# Patient Record
Sex: Male | Born: 1950 | Race: Black or African American | Hispanic: No | Marital: Single | Smoking: Former smoker
Health system: Southern US, Community
[De-identification: ages and names within clinical notes are randomized; demographics above are authoritative.]

## PROBLEM LIST (undated history)

## (undated) DIAGNOSIS — I10 Essential (primary) hypertension: Secondary | ICD-10-CM

## (undated) DIAGNOSIS — IMO0001 Reserved for inherently not codable concepts without codable children: Secondary | ICD-10-CM

## (undated) DIAGNOSIS — J449 Chronic obstructive pulmonary disease, unspecified: Secondary | ICD-10-CM

## (undated) DIAGNOSIS — D649 Anemia, unspecified: Secondary | ICD-10-CM

## (undated) DIAGNOSIS — H9190 Unspecified hearing loss, unspecified ear: Secondary | ICD-10-CM

## (undated) DIAGNOSIS — J45909 Unspecified asthma, uncomplicated: Secondary | ICD-10-CM

## (undated) DIAGNOSIS — E559 Vitamin D deficiency, unspecified: Secondary | ICD-10-CM

## (undated) DIAGNOSIS — R140 Abdominal distension (gaseous): Secondary | ICD-10-CM

## (undated) DIAGNOSIS — Z720 Tobacco use: Secondary | ICD-10-CM

## (undated) DIAGNOSIS — E119 Type 2 diabetes mellitus without complications: Secondary | ICD-10-CM

## (undated) HISTORY — DX: Reserved for inherently not codable concepts without codable children: IMO0001

## (undated) HISTORY — DX: Tobacco use: Z72.0

## (undated) HISTORY — DX: Essential (primary) hypertension: I10

## (undated) HISTORY — PX: EYE SURGERY: SHX253

## (undated) HISTORY — DX: Vitamin D deficiency, unspecified: E55.9

## (undated) HISTORY — DX: Abdominal distension (gaseous): R14.0

## (undated) HISTORY — DX: Unspecified hearing loss, unspecified ear: H91.90

## (undated) HISTORY — DX: Chronic obstructive pulmonary disease, unspecified: J44.9

## (undated) HISTORY — DX: Unspecified asthma, uncomplicated: J45.909

## (undated) HISTORY — DX: Type 2 diabetes mellitus without complications: E11.9

---

## 1999-03-13 ENCOUNTER — Ambulatory Visit (HOSPITAL_COMMUNITY): Admission: RE | Admit: 1999-03-13 | Discharge: 1999-03-13 | Payer: Self-pay | Admitting: Gastroenterology

## 2000-07-29 ENCOUNTER — Encounter: Admission: RE | Admit: 2000-07-29 | Discharge: 2000-07-29 | Payer: Self-pay | Admitting: Internal Medicine

## 2000-07-29 ENCOUNTER — Encounter: Payer: Self-pay | Admitting: Internal Medicine

## 2003-03-30 ENCOUNTER — Ambulatory Visit (HOSPITAL_BASED_OUTPATIENT_CLINIC_OR_DEPARTMENT_OTHER): Admission: RE | Admit: 2003-03-30 | Discharge: 2003-03-30 | Payer: Self-pay | Admitting: Internal Medicine

## 2003-12-06 ENCOUNTER — Emergency Department (HOSPITAL_COMMUNITY): Admission: EM | Admit: 2003-12-06 | Discharge: 2003-12-06 | Payer: Self-pay | Admitting: Emergency Medicine

## 2006-12-06 ENCOUNTER — Encounter: Admission: RE | Admit: 2006-12-06 | Discharge: 2006-12-06 | Payer: Self-pay | Admitting: Internal Medicine

## 2011-08-24 ENCOUNTER — Other Ambulatory Visit: Payer: Self-pay | Admitting: Internal Medicine

## 2012-10-24 ENCOUNTER — Encounter (HOSPITAL_COMMUNITY): Payer: Self-pay

## 2012-10-24 DIAGNOSIS — I10 Essential (primary) hypertension: Secondary | ICD-10-CM | POA: Insufficient documentation

## 2012-10-24 DIAGNOSIS — J309 Allergic rhinitis, unspecified: Secondary | ICD-10-CM | POA: Insufficient documentation

## 2012-10-24 DIAGNOSIS — J45909 Unspecified asthma, uncomplicated: Secondary | ICD-10-CM | POA: Insufficient documentation

## 2012-10-24 DIAGNOSIS — E119 Type 2 diabetes mellitus without complications: Secondary | ICD-10-CM | POA: Insufficient documentation

## 2012-10-24 DIAGNOSIS — J449 Chronic obstructive pulmonary disease, unspecified: Secondary | ICD-10-CM | POA: Insufficient documentation

## 2012-10-24 DIAGNOSIS — Z720 Tobacco use: Secondary | ICD-10-CM | POA: Insufficient documentation

## 2017-11-04 ENCOUNTER — Ambulatory Visit
Admission: RE | Admit: 2017-11-04 | Discharge: 2017-11-04 | Disposition: A | Discharge status: Home / Self Care | Payer: Self-pay | Source: Ambulatory Visit | Attending: Internal Medicine | Admitting: Internal Medicine

## 2017-11-04 ENCOUNTER — Other Ambulatory Visit: Payer: Self-pay | Admitting: Internal Medicine

## 2017-11-04 DIAGNOSIS — I10 Essential (primary) hypertension: Secondary | ICD-10-CM

## 2017-11-04 DIAGNOSIS — Z720 Tobacco use: Secondary | ICD-10-CM

## 2018-01-23 ENCOUNTER — Other Ambulatory Visit (HOSPITAL_COMMUNITY): Payer: Self-pay | Admitting: Internal Medicine

## 2018-01-23 DIAGNOSIS — Z720 Tobacco use: Secondary | ICD-10-CM

## 2018-01-28 ENCOUNTER — Other Ambulatory Visit (HOSPITAL_BASED_OUTPATIENT_CLINIC_OR_DEPARTMENT_OTHER): Payer: Self-pay

## 2018-01-28 DIAGNOSIS — G4730 Sleep apnea, unspecified: Secondary | ICD-10-CM

## 2018-01-29 ENCOUNTER — Ambulatory Visit (HOSPITAL_COMMUNITY): Payer: BLUE CROSS/BLUE SHIELD

## 2018-03-03 ENCOUNTER — Encounter (HOSPITAL_BASED_OUTPATIENT_CLINIC_OR_DEPARTMENT_OTHER): Payer: BLUE CROSS/BLUE SHIELD

## 2018-04-02 ENCOUNTER — Emergency Department (HOSPITAL_COMMUNITY): Payer: BLUE CROSS/BLUE SHIELD

## 2018-04-02 ENCOUNTER — Inpatient Hospital Stay (HOSPITAL_COMMUNITY)
Admission: EM | Admit: 2018-04-02 | Discharge: 2018-04-04 | DRG: 809 | Disposition: A | Discharge status: Home / Self Care | Payer: BLUE CROSS/BLUE SHIELD | Attending: Internal Medicine | Admitting: Internal Medicine

## 2018-04-02 ENCOUNTER — Other Ambulatory Visit: Payer: Self-pay

## 2018-04-02 ENCOUNTER — Inpatient Hospital Stay (HOSPITAL_COMMUNITY): Payer: BLUE CROSS/BLUE SHIELD

## 2018-04-02 ENCOUNTER — Encounter (HOSPITAL_COMMUNITY): Payer: Self-pay

## 2018-04-02 DIAGNOSIS — E559 Vitamin D deficiency, unspecified: Secondary | ICD-10-CM | POA: Diagnosis present

## 2018-04-02 DIAGNOSIS — K2980 Duodenitis without bleeding: Secondary | ICD-10-CM | POA: Diagnosis present

## 2018-04-02 DIAGNOSIS — Z91013 Allergy to seafood: Secondary | ICD-10-CM

## 2018-04-02 DIAGNOSIS — D61818 Other pancytopenia: Secondary | ICD-10-CM | POA: Diagnosis not present

## 2018-04-02 DIAGNOSIS — I10 Essential (primary) hypertension: Secondary | ICD-10-CM | POA: Diagnosis present

## 2018-04-02 DIAGNOSIS — Z87891 Personal history of nicotine dependence: Secondary | ICD-10-CM

## 2018-04-02 DIAGNOSIS — E119 Type 2 diabetes mellitus without complications: Secondary | ICD-10-CM

## 2018-04-02 DIAGNOSIS — K5730 Diverticulosis of large intestine without perforation or abscess without bleeding: Secondary | ICD-10-CM | POA: Diagnosis present

## 2018-04-02 DIAGNOSIS — E875 Hyperkalemia: Secondary | ICD-10-CM | POA: Diagnosis present

## 2018-04-02 DIAGNOSIS — N179 Acute kidney failure, unspecified: Secondary | ICD-10-CM | POA: Diagnosis present

## 2018-04-02 DIAGNOSIS — Z7982 Long term (current) use of aspirin: Secondary | ICD-10-CM

## 2018-04-02 DIAGNOSIS — H9190 Unspecified hearing loss, unspecified ear: Secondary | ICD-10-CM | POA: Diagnosis present

## 2018-04-02 DIAGNOSIS — E0800 Diabetes mellitus due to underlying condition with hyperosmolarity without nonketotic hyperglycemic-hyperosmolar coma (NKHHC): Secondary | ICD-10-CM | POA: Diagnosis not present

## 2018-04-02 DIAGNOSIS — J449 Chronic obstructive pulmonary disease, unspecified: Secondary | ICD-10-CM | POA: Diagnosis present

## 2018-04-02 DIAGNOSIS — D7589 Other specified diseases of blood and blood-forming organs: Secondary | ICD-10-CM | POA: Diagnosis present

## 2018-04-02 DIAGNOSIS — D638 Anemia in other chronic diseases classified elsewhere: Secondary | ICD-10-CM | POA: Diagnosis present

## 2018-04-02 DIAGNOSIS — I440 Atrioventricular block, first degree: Secondary | ICD-10-CM | POA: Diagnosis present

## 2018-04-02 DIAGNOSIS — J301 Allergic rhinitis due to pollen: Secondary | ICD-10-CM

## 2018-04-02 DIAGNOSIS — R911 Solitary pulmonary nodule: Secondary | ICD-10-CM | POA: Diagnosis present

## 2018-04-02 DIAGNOSIS — K921 Melena: Secondary | ICD-10-CM | POA: Diagnosis present

## 2018-04-02 DIAGNOSIS — K2950 Unspecified chronic gastritis without bleeding: Secondary | ICD-10-CM | POA: Diagnosis present

## 2018-04-02 DIAGNOSIS — K648 Other hemorrhoids: Secondary | ICD-10-CM | POA: Diagnosis present

## 2018-04-02 DIAGNOSIS — J309 Allergic rhinitis, unspecified: Secondary | ICD-10-CM | POA: Diagnosis present

## 2018-04-02 DIAGNOSIS — D649 Anemia, unspecified: Secondary | ICD-10-CM

## 2018-04-02 DIAGNOSIS — I959 Hypotension, unspecified: Secondary | ICD-10-CM | POA: Diagnosis present

## 2018-04-02 HISTORY — DX: Anemia, unspecified: D64.9

## 2018-04-02 LAB — RETICULOCYTES
RBC.: 2.29 MIL/uL — ABNORMAL LOW (ref 4.22–5.81)
RBC.: <1 MIL/uL — ABNORMAL LOW (ref 4.22–5.81)
RETIC CT PCT: 2.3 % (ref 0.4–3.1)
Retic Count, Absolute: 57.3 10*3/uL (ref 19.0–186.0)
Retic Ct Pct: 2.5 % (ref 0.4–3.1)

## 2018-04-02 LAB — BASIC METABOLIC PANEL
ANION GAP: 6 (ref 5–15)
Anion gap: 9 (ref 5–15)
BUN: 39 mg/dL — ABNORMAL HIGH (ref 8–23)
BUN: 40 mg/dL — ABNORMAL HIGH (ref 8–23)
CHLORIDE: 106 mmol/L (ref 98–111)
CHLORIDE: 107 mmol/L (ref 98–111)
CO2: 21 mmol/L — ABNORMAL LOW (ref 22–32)
CO2: 23 mmol/L (ref 22–32)
CREATININE: 2.08 mg/dL — AB (ref 0.61–1.24)
Calcium: 9 mg/dL (ref 8.9–10.3)
Calcium: 8.9 mg/dL (ref 8.9–10.3)
Creatinine, Ser: 2.31 mg/dL — ABNORMAL HIGH (ref 0.61–1.24)
GFR calc non Af Amer: 32 mL/min — ABNORMAL LOW (ref >60)
GFR calc non Af Amer: 28 mL/min — ABNORMAL LOW (ref >60)
GFR, EST AFRICAN AMERICAN: 37 mL/min — AB (ref >60)
GFR, EST AFRICAN AMERICAN: 33 mL/min — AB (ref >60)
GLUCOSE: 118 mg/dL — AB (ref 70–99)
Glucose, Bld: 112 mg/dL — ABNORMAL HIGH (ref 70–99)
Potassium: 5.5 mmol/L — ABNORMAL HIGH (ref 3.5–5.1)
Potassium: 5.6 mmol/L — ABNORMAL HIGH (ref 3.5–5.1)
Sodium: 136 mmol/L (ref 135–145)
Sodium: 136 mmol/L (ref 135–145)

## 2018-04-02 LAB — HEMOGLOBIN A1C
Hgb A1c MFr Bld: 7.3 % — ABNORMAL HIGH (ref 4.8–5.6)
Mean Plasma Glucose: 162.81 mg/dL

## 2018-04-02 LAB — CBC WITH DIFFERENTIAL/PLATELET
BASOS ABS: 0 10*3/uL (ref 0.0–0.1)
Basophils Relative: 1 %
Eosinophils Absolute: 0 10*3/uL (ref 0.0–0.7)
Eosinophils Relative: 1 %
HEMATOCRIT: 11.4 % — AB (ref 39.0–52.0)
HEMOGLOBIN: 3.6 g/dL — AB (ref 13.0–17.0)
LYMPHS PCT: 36 %
Lymphs Abs: 0.9 10*3/uL (ref 0.7–4.0)
MCH: 41.4 pg — ABNORMAL HIGH (ref 26.0–34.0)
MCHC: 31.6 g/dL (ref 30.0–36.0)
MCV: 131 fL — ABNORMAL HIGH (ref 78.0–100.0)
MONOS PCT: 19 %
Monocytes Absolute: 0.5 10*3/uL (ref 0.1–1.0)
NEUTROS ABS: 1.1 10*3/uL — AB (ref 1.7–7.7)
Neutrophils Relative %: 43 %
Platelets: 102 10*3/uL — ABNORMAL LOW (ref 150–400)
RDW: 16.2 % — ABNORMAL HIGH (ref 11.5–15.5)
WBC: 2.5 10*3/uL — AB (ref 4.0–10.5)

## 2018-04-02 LAB — HEPATIC FUNCTION PANEL
ALBUMIN: 3.7 g/dL (ref 3.5–5.0)
ALK PHOS: 68 U/L (ref 38–126)
ALT: 11 U/L (ref 0–44)
AST: 11 U/L — AB (ref 15–41)
BILIRUBIN TOTAL: 0.4 mg/dL (ref 0.3–1.2)
Bilirubin, Direct: <0.1 mg/dL (ref 0.0–0.2)
Total Protein: 7.4 g/dL (ref 6.5–8.1)

## 2018-04-02 LAB — PREPARE RBC (CROSSMATCH)

## 2018-04-02 LAB — IRON AND TIBC
Iron: 148 ug/dL (ref 45–182)
Iron: 133 ug/dL (ref 45–182)
SATURATION RATIOS: 29 % (ref 17.9–39.5)
SATURATION RATIOS: 26 % (ref 17.9–39.5)
TIBC: 515 ug/dL — AB (ref 250–450)
TIBC: 512 ug/dL — AB (ref 250–450)
UIBC: 367 ug/dL
UIBC: 379 ug/dL

## 2018-04-02 LAB — VITAMIN B12
VITAMIN B 12: 1162 pg/mL — AB (ref 180–914)
Vitamin B-12: 1519 pg/mL — ABNORMAL HIGH (ref 180–914)

## 2018-04-02 LAB — FERRITIN
FERRITIN: 81 ng/mL (ref 24–336)
Ferritin: 86 ng/mL (ref 24–336)

## 2018-04-02 LAB — TROPONIN I: Troponin I: <0.03 ng/mL (ref <0.03)

## 2018-04-02 LAB — MAGNESIUM: MAGNESIUM: 3 mg/dL — AB (ref 1.7–2.4)

## 2018-04-02 LAB — GLUCOSE, CAPILLARY: Glucose-Capillary: 108 mg/dL — ABNORMAL HIGH (ref 70–99)

## 2018-04-02 LAB — I-STAT TROPONIN, ED: Troponin i, poc: 0 ng/mL (ref 0.00–0.08)

## 2018-04-02 LAB — FOLATE
FOLATE: 9.7 ng/mL (ref >5.9)
Folate: 12.3 ng/mL (ref >5.9)

## 2018-04-02 LAB — CORTISOL: CORTISOL PLASMA: 13.7 ug/dL

## 2018-04-02 LAB — ABO/RH: ABO/RH(D): O POS

## 2018-04-02 LAB — T4, FREE: FREE T4: 0.73 ng/dL — AB (ref 0.82–1.77)

## 2018-04-02 LAB — SAVE SMEAR

## 2018-04-02 LAB — TSH: TSH: 0.532 u[IU]/mL (ref 0.350–4.500)

## 2018-04-02 LAB — POC OCCULT BLOOD, ED: Fecal Occult Bld: POSITIVE — AB

## 2018-04-02 LAB — LACTATE DEHYDROGENASE: LDH: 99 U/L (ref 98–192)

## 2018-04-02 MED ORDER — SODIUM CHLORIDE 0.9 % IV SOLN
10.0000 mL/h | Freq: Once | INTRAVENOUS | Status: AC
  Administered 2018-04-02: 10 mL/h via INTRAVENOUS

## 2018-04-02 MED ORDER — LEVALBUTEROL HCL 0.63 MG/3ML IN NEBU: 0.6300 mg | INHALATION_SOLUTION | Freq: Four times a day (QID) | RESPIRATORY_TRACT | Status: DC | PRN

## 2018-04-02 MED ORDER — SODIUM CHLORIDE 0.9 % IV SOLN
8.0000 mg/h | INTRAVENOUS | Status: DC
  Administered 2018-04-02 – 2018-04-04 (×4): 8 mg/h via INTRAVENOUS
  Filled 2018-04-02 (×8): qty 80

## 2018-04-02 MED ORDER — SODIUM BICARBONATE 8.4 % IV SOLN
50.0000 meq | Freq: Once | INTRAVENOUS | Status: AC
  Administered 2018-04-02: 50 meq via INTRAVENOUS
  Filled 2018-04-02: qty 50

## 2018-04-02 MED ORDER — SODIUM CHLORIDE 0.9 % IV SOLN
80.0000 mg | Freq: Once | INTRAVENOUS | Status: AC
  Administered 2018-04-02: 80 mg via INTRAVENOUS
  Filled 2018-04-02: qty 80

## 2018-04-02 MED ORDER — ACETAMINOPHEN 325 MG PO TABS: 650.0000 mg | ORAL_TABLET | Freq: Four times a day (QID) | ORAL | Status: DC | PRN

## 2018-04-02 MED ORDER — SODIUM CHLORIDE 0.9 % IV BOLUS
1000.0000 mL | Freq: Once | INTRAVENOUS | Status: AC
  Administered 2018-04-02: 1000 mL via INTRAVENOUS

## 2018-04-02 MED ORDER — ACETAMINOPHEN 650 MG RE SUPP: 650.0000 mg | Freq: Four times a day (QID) | RECTAL | Status: DC | PRN

## 2018-04-02 MED ORDER — ONDANSETRON HCL 4 MG PO TABS: 4.0000 mg | ORAL_TABLET | Freq: Four times a day (QID) | ORAL | Status: DC | PRN

## 2018-04-02 MED ORDER — ONDANSETRON HCL 4 MG/2ML IJ SOLN: 4.0000 mg | Freq: Four times a day (QID) | INTRAMUSCULAR | Status: DC | PRN

## 2018-04-02 MED ORDER — INSULIN ASPART 100 UNIT/ML ~~LOC~~ SOLN: 0.0000 [IU] | Freq: Three times a day (TID) | SUBCUTANEOUS | Status: DC

## 2018-04-02 MED ORDER — SODIUM CHLORIDE 0.9 % IV SOLN
1.0000 g | Freq: Once | INTRAVENOUS | Status: AC
  Administered 2018-04-02: 1 g via INTRAVENOUS
  Filled 2018-04-02: qty 10

## 2018-04-02 NOTE — ED Provider Notes (Signed)
[facility] DEPARTMENT Provider Note   CSN: [id number] Arrival date & time: 04/02/18  1356     History   Chief Complaint Chief Complaint  Patient presents with  . Dizziness    HPI Andrew [name] is a 67 y.o. male with a history of HTN, COPD, asthma, and DM who presents with a 1-week history of lightheadedness and weakness upon standing. He denies fevers/chills, chest pain, dyspnea, abdominal pain, melena, hematochezia, vomiting, dysuria, vertigo, or syncope. He reports that he hasn't been eating or drinking a lot recently, but has no answer as to why that is. He went to urgent care today for evaluation of these symptoms where his initial blood pressure was 80/54 and an EKG showed normal sinus rhythm with limited first degree AV block. He received a breathing treatment and was discharged per patient. However, patient felt his symptoms were continuing, so he came to the ED. He reports that he has never had a colonoscopy.    Past Medical History:  Diagnosis Date  . Abdominal bloating    probable lactose intolorence  . Asthma   . COPD (chronic obstructive pulmonary disease) (HCC)    mild improvement  . DM (diabetes mellitus) ([place])   . Hearing impairment    asymptomatic  . HTN (hypertension)   . Tobacco use   . Vitamin D deficiency     Patient Active Problem List   Diagnosis Date Noted  . Allergic rhinitis 10/24/2012  . HTN (hypertension)   . COPD (chronic obstructive pulmonary disease) ([place])   . Asthma   . DM (diabetes mellitus) ([place])   . Tobacco use     History reviewed. No pertinent surgical history.      Home Medications    Prior to Admission medications   Medication Sig Start Date End Date Taking? Authorizing Provider  amLODipine (NORVASC) 10 MG tablet Take 10 mg by mouth daily.    [provider]  aspirin 81 MG tablet Take 81 mg by mouth daily.    [provider]  azelastine (ASTELIN) 137 MCG/SPRAY nasal spray  Place 1 spray into the nose 2 (two) times daily. Use in each nostril as directed    [provider]  cetirizine (ZYRTEC) 10 MG tablet Take 10 mg by mouth daily.    [provider]  cholecalciferol (VITAMIN D) 1000 UNITS tablet Take 1,000 Units by mouth daily.    [provider]  metoprolol (LOPRESSOR) 50 MG tablet Take 50 mg by mouth daily. Half tablet    [provider]  tetrahydrozoline-zinc (VISINE-AC) 0.05-0.25 % ophthalmic solution 2 drops as needed.    [provider]  triamterene-hydrochlorothiazide (MAXZIDE-25) 37.5-25 MG per tablet Take 1 tablet by mouth daily.    [provider]    Family History History reviewed. No pertinent family history.  Social History Social History   Tobacco Use  . Smoking status: Former Smoker    Packs/day: 0.25    Types: Cigarettes    Last attempt to quit: 03/02/2018    Years since quitting: 0.0  Substance Use Topics  . Alcohol use: Yes    Frequency: Never    Comment: occ  . Drug use: Never     Allergies   Patient has no known allergies.   Review of Systems Review of Systems  Constitutional: Positive for fatigue. Negative for chills and fever.  HENT: Negative for sore throat.   Eyes: Negative for visual disturbance.  Respiratory: Negative for cough and shortness  of breath.   Cardiovascular: Negative for chest pain and leg swelling.  Gastrointestinal: Negative for abdominal pain, blood in stool, nausea and vomiting.  Genitourinary: Negative for difficulty urinating and dysuria.  Skin: Negative for rash and wound.  Neurological: Positive for weakness and light-headedness. Negative for syncope, numbness and headaches.     Physical Exam Updated Vital Signs BP (!) 86/39 (BP Location: Left Arm)   Pulse 75   Temp 98.9 F (37.2 C) (Oral)   Resp 18   Ht 5\' 7"  (1.702 m)   Wt 77.1 kg (170 lb)   SpO2 98%   BMI 26.63 kg/m   Physical Exam  Constitutional: He is oriented to person,  place, and time. He appears well-developed and well-nourished. No distress.  HENT:  Head: Normocephalic and atraumatic.  Dry mucous membranes  Eyes: Pupils are equal, round, and reactive to light. EOM are normal.  Subconjunctival pallor. Scleral icterus.   Cardiovascular: Normal rate and regular rhythm. Exam reveals no gallop and no friction rub.  No murmur heard. Pulmonary/Chest: Effort normal. No respiratory distress. He has wheezes.  Abdominal: Soft. He exhibits no distension. There is no tenderness. There is no guarding.  Genitourinary: Rectal exam shows guaiac positive stool.  Genitourinary Comments: guiac performed with RN at bedside.  Musculoskeletal: He exhibits no edema or deformity.  Neurological: He is alert and oriented to person, place, and time.  Skin: Skin is warm and dry. Capillary refill takes 2 to 3 seconds. No rash noted.  Psychiatric: He has a normal mood and affect. His behavior is normal.     ED Treatments / Results  Labs (all labs ordered are listed, but only abnormal results are displayed) Labs Reviewed  BASIC METABOLIC PANEL - Abnormal; Notable for the following components:      Result Value   Potassium 5.5 (*)    CO2 21 (*)    Glucose, Bld 118 (*)    BUN 39 (*)    Creatinine, Ser 2.08 (*)    GFR calc non Af Amer 32 (*)    GFR calc Af Amer 37 (*)    All other components within normal limits  HEPATIC FUNCTION PANEL  DIFFERENTIAL  CBC WITH DIFFERENTIAL/PLATELET  BASIC METABOLIC PANEL  I-STAT TROPONIN, ED  I-STAT CHEM 8, ED    EKG EKG Interpretation  Date/Time:  Wednesday April 02 2018 13:59:27 EDT Ventricular Rate:  73 PR Interval:  238 QRS Duration: 144 QT Interval:  414 QTC Calculation: 456 R Axis:   76 Text Interpretation:  Sinus rhythm with 1st degree A-V block with Fusion complexes Right bundle branch block Possible Anteroseptal infarct , age undetermined Abnormal ECG Confirmed by Milton Ferguson ([id number]) on 04/02/2018 2:26:51  PM   Radiology Dg Chest Port 1 View  Result Date: 04/02/2018 CLINICAL DATA:  Weakness EXAM: PORTABLE CHEST 1 VIEW COMPARISON:  November 04, 2017 FINDINGS: Lungs are clear. Heart is borderline prominent with pulmonary vascularity normal. No adenopathy. There is aortic atherosclerosis. No evident bone lesions. IMPRESSION: Heart borderline prominent. No edema or consolidation. There is aortic atherosclerosis. Aortic Atherosclerosis (ICD10-I70.0). Electronically Signed   By: Lowella Grip III M.D.   On: 04/02/2018 14:36    Procedures Procedures (including critical care time)  Medications Ordered in ED Medications  sodium chloride 0.9 % bolus 1,000 mL (1,000 mLs Intravenous New Bag/Given 04/02/18 1442)     Initial Impression / Assessment and Plan / ED Course  I have reviewed the triage vital signs and the nursing notes.  Pertinent  labs & imaging results that were available during my care of the patient were reviewed by me and considered in my medical decision making (see chart for details).  Andrew [name] is a 67 y.o. male with a history of HTN, COPD, asthma, and DM who presents with a 1-week history of lightheadedness and weakness upon standing. Upon arrival to the ED, patient is afebrile and hypotensive to 86/39 for which he was given 1L NS bolus. Physical exam was significant for subconjunctival pallor and guaiac positive stool. Labs were significant for WBC 2.5, Hb 3.6, plt 102, K 5.6, BUN 40, and Cr 2.31. EKG showed new RBBB. CXR was unremarkable. Given marked anemia, patient was typed and screened, pRBC transfusion was initiated (with plan for a transfusion of 4 units), and protonix drip was started. Given hyperkalemia, treated with calcium gluconate and sodium bicarbonate Patient warrants admission for workup of pancytopenia, marked symptomatic anemia, and AKI. Patient accepted by Dr. Paulita Fujita.   Final Clinical Impressions(s) / ED Diagnoses   Final diagnoses:  None    ED Discharge  Orders    None       Naethan Bracewell, Andree Elk, MD 04/02/18 2338    Drenda Freeze, MD 04/03/18 [phone]

## 2018-04-02 NOTE — Progress Notes (Signed)
Andrew [name] is a 67 y.o. male patient admitted from ED awake, alert - oriented  X 4 - no acute distress noted.  VSS - Blood pressure 110/69, pulse 85, temperature 98.4 F (36.9 C), temperature source Oral, resp. rate (!) 21, height 5\' 7"  (1.702 m), weight 83.5 kg (184 lb 1.4 oz), SpO2 98 %.    IV in place, occlusive dsg intact without redness.  Orientation to room, and floor completed with information packet given to patient/family.  Patient declined safety video at this time.  Admission INP armband ID verified with patient/family, and in place.   SR up x 2, fall assessment complete, with patient and family able to verbalize understanding of risk associated with falls, and verbalized understanding to call nsg before up out of bed.  Call light within reach, patient able to voice, and demonstrate understanding.  Skin, clean-dry- intact without evidence of bruising, or skin tears.   No evidence of skin break down noted on exam.     Will cont to eval and treat per MD orders.  Holley Raring, RN 04/02/2018 7:05 PM

## 2018-04-02 NOTE — ED Triage Notes (Addendum)
Pt endorses shob/dizziness x several months worse with getting up and exertion, went to a routine visit with pcp and sent here. Denies CP. Hypotensive in triage 86/39 and 90/38, takes HTN meds.

## 2018-04-02 NOTE — ED Provider Notes (Signed)
Patient placed in Quick Look pathway, seen and evaluated   Chief Complaint: Lightheaded, sent by PCP  HPI:   67 year old with PMH of HTN presents with lightheadedness and hypotension. He states that he's been lightheaded with standing for the past couple weeks. He went for a routine visit with his PCP today and was advised to come to the ED. The patient denies any symptoms. No headache, chest pain, SOB, abdominal pain, blood in the stool. He is not on blood thinners. He did not take any extra BP pills.   ROS: +lightheaded  Physical Exam:   Gen: No distress  Neuro: Awake and Alert  Skin: Warm    Focused Exam: Heart: Regular rate and rhythm. Soft murmur in apex.     Lungs: CTA.     Abdomen: Non-tender   Initiation of care has begun. The patient has been counseled on the process, plan, and necessity for staying for the completion/evaluation, and the remainder of the medical screening examination    Andrew Evangelist, PA-C 04/02/18 [place], Nathan, MD 04/02/18 [phone]

## 2018-04-02 NOTE — H&P (Addendum)
[facility] History and Physical  Andrew [name] WPY:[id number] DOB: 02/10/1951 DOA: 04/02/2018  Referring physician:   PCP: [name], MD   Chief Complaint:   HPI:   67 year old male with a history of diabetes, COPD, hypertension who presents with shortness of breath, dizziness for the last several months, orthostatic symptoms, dizziness is worse when he tries to get out of bed, went to see his primary care provider Dr. Kevan Ny today who sent him to the ED for further evaluation. He is a poor historian , and after asking many times, states he has lost 12lbs, then states that he lost this weight in [employer]. He states his appetite has been ok . He denies chest pain, sob , but has had palpitations. Dr Marlou Sa had wanted him to ge worked up for his anemia in Feb, 2019, but patient has not had a colonoscopy yet .  In triage  patient was found to be hypotensive with initial blood pressure 86/39, 90/38. Patient denies any chest pain, shortness of breath. He has noticed some degree of blood vs brown stool.History is very hard to ascertain.Marland KitchenHe reports that he hasn't been eating or drinking a lot recently, but has no answer as to why that is. EKG showed normal sinus rhythm with limited first degree AV block.    ED course BP (!) 86/39 (BP Location: Left Arm)   Pulse 75   Temp 98.9 F (37.2 C) (Oral)   Resp 18   Ht 5\' 7"  (1.702 m)   Wt 77.1 kg (170 lb)   SpO2 98%   BMI 26.63 kg/m   Potassium 5.5 (*)    CO2 21 (*)    Glucose, Bld 118 (*)    BUN 39 (*)    Creatinine, Ser 2.08 (*)    GFR calc non Af Amer 32 (*)    GFR calc Af Amer 37 (*)    All other components within norm   Hg 3.6, pancytopenic  Admitted to step down for further evaluation of his profound anemia , pancytopenia    Review of Systems: negative for the following  Constitutional: Denies fever, chills, diaphoresis,   Positive for fatigue HEENT: Denies photophobia, eye pain, redness, hearing loss, ear  pain, congestion, sore throat, rhinorrhea, sneezing, mouth sores, trouble swallowing, neck pain, neck stiffness and tinnitus.  Respiratory: Denies SOB, DOE, cough, chest tightness, and wheezing.  Cardiovascular: Denies chest pain, palpitations and leg swelling.  Gastrointestinal: Denies nausea, vomiting, abdominal pain, diarrhea, constipation, blood in stool and abdominal distention.  Genitourinary: Denies dysuria, urgency, frequency, hematuria, flank pain and difficulty urinating.  Musculoskeletal: Denies myalgias, back pain, joint swelling, arthralgias and gait problem.  Skin: Denies pallor, rash and wound.  Neurological: Positive for weakness and light-headedness  Hematological: Denies adenopathy. Easy bruising, personal or family bleeding history  Psychiatric/Behavioral: Denies suicidal ideation, mood changes, confusion, nervousness, sleep disturbance and agitation       Past Medical History:  Diagnosis Date  . Abdominal bloating    probable lactose intolorence  . Asthma   . COPD (chronic obstructive pulmonary disease) (HCC)    mild improvement  . DM (diabetes mellitus) ([place])   . Hearing impairment    asymptomatic  . HTN (hypertension)   . Tobacco use   . Vitamin D deficiency      History reviewed. No pertinent surgical history.    Social History:  reports that he quit smoking about 4 weeks ago. His smoking use included cigarettes. He smoked 0.25 packs  per day. He does not have any smokeless tobacco history on file. He reports that he drinks alcohol. He reports that he does not use drugs.    No Known Allergies      FAMILY HISTORY  When questioned  Directly-patient reports  No family history of HTN, CVA ,DIABETES, TB, Cancer CAD, Bleeding Disorders, Sickle Cell, diabetes, anemia, asthma,   Prior to Admission medications   Medication Sig Start Date End Date Taking? Authorizing Provider  amLODipine (NORVASC) 10 MG tablet Take 10 mg by mouth daily.    [provider]  aspirin 81 MG tablet Take 81 mg by mouth daily.    [provider]  azelastine (ASTELIN) 137 MCG/SPRAY nasal spray Place 1 spray into the nose 2 (two) times daily. Use in each nostril as directed    [provider]  cetirizine (ZYRTEC) 10 MG tablet Take 10 mg by mouth daily.    [provider]  cholecalciferol (VITAMIN D) 1000 UNITS tablet Take 1,000 Units by mouth daily.    [provider]  metoprolol (LOPRESSOR) 50 MG tablet Take 50 mg by mouth daily. Half tablet    [provider]  tetrahydrozoline-zinc (VISINE-AC) 0.05-0.25 % ophthalmic solution 2 drops as needed.    [provider]  triamterene-hydrochlorothiazide (MAXZIDE-25) 37.5-25 MG per tablet Take 1 tablet by mouth daily.    [provider]     Physical Exam: Vitals:   04/02/18 1401 04/02/18 1402  BP: (!) 86/39   Pulse: 75   Resp: 18   Temp: 98.9 F (37.2 C)   TempSrc: Oral   SpO2: 98%   Weight:  77.1 kg (170 lb)  Height:  5\' 7"  (1.702 m)        Vitals:   04/02/18 1401 04/02/18 1402  BP: (!) 86/39   Pulse: 75   Resp: 18   Temp: 98.9 F (37.2 C)   TempSrc: Oral   SpO2: 98%   Weight:  77.1 kg (170 lb)  Height:  5\' 7"  (1.702 m)   Constitutional: NAD, calm, comfortable Eyes: PERRL, lids and conjunctivae normal ENMT: Mucous membranes are moist. Posterior pharynx clear of any exudate or lesions.Normal dentition.  Neck: normal, supple, no masses, no thyromegaly Respiratory: clear to auscultation bilaterally, no wheezing, no crackles. Normal respiratory effort. No accessory muscle use.  Cardiovascular: Regular rate and rhythm, no murmurs / rubs / gallops. No extremity edema. 2+ pedal pulses. No carotid bruits.  Abdominal: Soft. He exhibits no distension. There is no tenderness. There is no guarding.  Musculoskeletal: no clubbing / cyanosis. No joint deformity upper and lower extremities. Good ROM, no contractures. Normal muscle tone.   Skin: no rashes, lesions, ulcers. No induration Neurologic: CN 2-12 grossly intact. Sensation intact, DTR normal. Strength 5/5 in all 4.  Psychiatric: Normal judgment and insight. Alert and oriented x 3. Normal mood.     Labs on Admission: I have personally reviewed following labs and imaging studies  CBC: Recent Labs  Lab 04/02/18 1459  WBC 2.5*  NEUTROABS 1.1*  HGB 3.6*  HCT 11.4*  MCV 131.0*  PLT 102*    Basic Metabolic Panel: Recent Labs  Lab 04/02/18 1413 04/02/18 1459  NA 136 136  K 5.5* 5.6*  CL 106 107  CO2 21* 23  GLUCOSE 118* 112*  BUN 39* 40*  CREATININE 2.08* 2.31*  CALCIUM 9.0 8.9    GFR: Estimated Creatinine Clearance: 30.2 mL/min (A) (by C-G formula based on SCr of 2.31 mg/dL (H)).  Liver Function Tests: Recent Labs  Lab 04/02/18 1459  AST 11*  ALT 11  ALKPHOS 68  BILITOT 0.4  PROT 7.4  ALBUMIN 3.7   No results for input(s): LIPASE, AMYLASE in the last 168 hours. No results for input(s): AMMONIA in the last 168 hours.  Coagulation Profile: No results for input(s): INR, PROTIME in the last 168 hours. No results for input(s): DDIMER in the last 72 hours.  Cardiac Enzymes: No results for input(s): CKTOTAL, CKMB, CKMBINDEX, TROPONINI in the last 168 hours.  BNP (last 3 results) No results for input(s): PROBNP in the last 8760 hours.  HbA1C: No results for input(s): HGBA1C in the last 72 hours. No results found for: HGBA1C   CBG: No results for input(s): GLUCAP in the last 168 hours.  Lipid Profile: No results for input(s): CHOL, HDL, LDLCALC, TRIG, CHOLHDL, LDLDIRECT in the last 72 hours.  Thyroid Function Tests: No results for input(s): TSH, T4TOTAL, FREET4, T3FREE, THYROIDAB in the last 72 hours.  Anemia Panel: No results for input(s): VITAMINB12, FOLATE, FERRITIN, TIBC, IRON, RETICCTPCT in the last 72 hours.  Urine analysis: No results found for: COLORURINE, APPEARANCEUR, LABSPEC, PHURINE, GLUCOSEU, HGBUR, BILIRUBINUR,  KETONESUR, PROTEINUR, UROBILINOGEN, NITRITE, LEUKOCYTESUR  Sepsis Labs: @LABRCNTIP (procalcitonin:4,lacticidven:4) )No results found for this or any previous visit (from the past 240 hour(s)).       Radiological Exams on Admission: Dg Chest Port 1 View  Result Date: 04/02/2018 CLINICAL DATA:  Weakness EXAM: PORTABLE CHEST 1 VIEW COMPARISON:  November 04, 2017 FINDINGS: Lungs are clear. Heart is borderline prominent with pulmonary vascularity normal. No adenopathy. There is aortic atherosclerosis. No evident bone lesions. IMPRESSION: Heart borderline prominent. No edema or consolidation. There is aortic atherosclerosis. Aortic Atherosclerosis (ICD10-I70.0). Electronically Signed   By: Lowella Grip III M.D.   On: 04/02/2018 14:36   Dg Chest Port 1 View  Result Date: 04/02/2018 CLINICAL DATA:  Weakness EXAM: PORTABLE CHEST 1 VIEW COMPARISON:  November 04, 2017 FINDINGS: Lungs are clear. Heart is borderline prominent with pulmonary vascularity normal. No adenopathy. There is aortic atherosclerosis. No evident bone lesions. IMPRESSION: Heart borderline prominent. No edema or consolidation. There is aortic atherosclerosis. Aortic Atherosclerosis (ICD10-I70.0). Electronically Signed   By: Lowella Grip III M.D.   On: 04/02/2018 14:36      EKG: Independently reviewed.    Sinus rhythm with 1st degree A-V block with Fusion complexes Right bundle branch block Possible Anteroseptal infarct , age undetermined     Assessment/Plan Principal Problem:   Pancytopenia ([place]) Initial hg 3.6, guaiac positive Anemia panel  Shows elevated TIBC  Started on PPI gtt, but feel primary issue is bone marrow , suspect MDS ,  Transfuse 4 units PRBC  Will check LDH  Peripheral smear  Hematology consult in am  Pan CT due to weight loss    Hypotension Ongoing antihypertensive use in the setting of profound anemia Check cortisol , free t4 Echo to r/o cardiomyopathy Initial troponin negative Continue  to cycle cardiac enzymes     HTN (hypertension) Hold amlodipine , maxide and Lotensin    COPD (chronic obstructive pulmonary disease) (HCC) Without exacerbation, prn xopenex     DM (diabetes mellitus) (HCC) Do accuchecks, SSI       DVT prophylaxis:  scd's      Code Status Orders  (From admission, onward)        Start     Ordered   04/02/18 1729  Full code  Continuous     04/02/18 1731  Code Status History    This patient has a current code status but no historical code status.       consults called:  Family Communication: Admission, patients condition and plan of care including tests being ordered have been discussed with the patient  who indicates understanding and agree with the plan and Code Status   Admission status:  The appropriate patient status for this patient is INPATIENT. Inpatient status is judged to be reasonable and necessary in order to provide the required intensity of service to ensure the patient's safety. The patient's presenting symptoms, physical exam findings, and initial radiographic and laboratory data in the context of their chronic comorbidities is felt to place them at high risk for further clinical deterioration. Furthermore, it is not anticipated that the patient will be medically stable for discharge from the hospital within 2 midnights of admission. The following factors support the patient status of inpatient.    "           The patient's presenting symptoms include low back pain. "           The worrisome physical exam findings include and inability to walk. "           The initial radiographic and laboratory data are worrisome because of sacral fracture on CT. "           The chronic co-morbidities include history of hypertension.     * I certify that at the point of admission it is my clinical judgment that the patient will require inpatient hospital care spanning beyond 2 midnights from the point of admission due to high intensity of  service, high risk for further deterioration and high frequency of surveillance required.*    Disposition plan: Further plan will depend as patient's clinical course evolves and further radiologic and laboratory data become available. Likely home when stable    At the time of admission, it appears that the appropriate admission status for this patient is INPATIENT . This is judged to be reasonable and necessary in order to provide the required intensity of service to ensure the patient's safety given the presenting symptoms, physical exam findings, and initial radiographic and laboratory data in the context of their chronic comorbidities.   Reyne Dumas MD [facility] Pager [phone]  If 7PM-7AM, please contact night-coverage [email or web address] Password [facility]  04/02/2018, 5:32 PM

## 2018-04-02 NOTE — Progress Notes (Signed)
Consent for blood transfusion obtained at this time. Family at bedside. No questions or needs voiced at this time.

## 2018-04-03 ENCOUNTER — Encounter (HOSPITAL_COMMUNITY): Payer: Self-pay | Admitting: General Practice

## 2018-04-03 ENCOUNTER — Inpatient Hospital Stay (HOSPITAL_COMMUNITY): Payer: BLUE CROSS/BLUE SHIELD | Admitting: Anesthesiology

## 2018-04-03 ENCOUNTER — Inpatient Hospital Stay (HOSPITAL_COMMUNITY): Payer: BLUE CROSS/BLUE SHIELD

## 2018-04-03 ENCOUNTER — Encounter (HOSPITAL_COMMUNITY)
Admission: EM | Disposition: A | Discharge status: Home / Self Care | Payer: Self-pay | Source: Home / Self Care | Attending: Internal Medicine

## 2018-04-03 DIAGNOSIS — D61818 Other pancytopenia: Secondary | ICD-10-CM | POA: Diagnosis not present

## 2018-04-03 DIAGNOSIS — E0800 Diabetes mellitus due to underlying condition with hyperosmolarity without nonketotic hyperglycemic-hyperosmolar coma (NKHHC): Secondary | ICD-10-CM | POA: Diagnosis not present

## 2018-04-03 DIAGNOSIS — E119 Type 2 diabetes mellitus without complications: Secondary | ICD-10-CM

## 2018-04-03 DIAGNOSIS — R911 Solitary pulmonary nodule: Secondary | ICD-10-CM | POA: Diagnosis not present

## 2018-04-03 DIAGNOSIS — R195 Other fecal abnormalities: Secondary | ICD-10-CM | POA: Diagnosis not present

## 2018-04-03 DIAGNOSIS — K2980 Duodenitis without bleeding: Secondary | ICD-10-CM | POA: Diagnosis not present

## 2018-04-03 DIAGNOSIS — J439 Emphysema, unspecified: Secondary | ICD-10-CM | POA: Diagnosis not present

## 2018-04-03 DIAGNOSIS — K2950 Unspecified chronic gastritis without bleeding: Secondary | ICD-10-CM

## 2018-04-03 DIAGNOSIS — I10 Essential (primary) hypertension: Secondary | ICD-10-CM | POA: Diagnosis not present

## 2018-04-03 DIAGNOSIS — N289 Disorder of kidney and ureter, unspecified: Secondary | ICD-10-CM | POA: Diagnosis not present

## 2018-04-03 DIAGNOSIS — Z91013 Allergy to seafood: Secondary | ICD-10-CM

## 2018-04-03 DIAGNOSIS — D649 Anemia, unspecified: Secondary | ICD-10-CM

## 2018-04-03 HISTORY — DX: Anemia, unspecified: D64.9

## 2018-04-03 HISTORY — PX: ESOPHAGOGASTRODUODENOSCOPY (EGD) WITH PROPOFOL: SHX5813

## 2018-04-03 HISTORY — PX: BIOPSY: SHX5522

## 2018-04-03 LAB — HIV ANTIBODY (ROUTINE TESTING W REFLEX): HIV SCREEN 4TH GENERATION: NONREACTIVE

## 2018-04-03 LAB — CBC
HCT: 23.9 % — ABNORMAL LOW (ref 39.0–52.0)
HCT: 23.8 % — ABNORMAL LOW (ref 39.0–52.0)
HEMOGLOBIN: 7.8 g/dL — AB (ref 13.0–17.0)
Hemoglobin: 7.8 g/dL — ABNORMAL LOW (ref 13.0–17.0)
MCH: 32.5 pg (ref 26.0–34.0)
MCH: 33.2 pg (ref 26.0–34.0)
MCHC: 32.6 g/dL (ref 30.0–36.0)
MCHC: 32.8 g/dL (ref 30.0–36.0)
MCV: 99.6 fL (ref 78.0–100.0)
MCV: 101.3 fL — AB (ref 78.0–100.0)
PLATELETS: 86 10*3/uL — AB (ref 150–400)
Platelets: 83 10*3/uL — ABNORMAL LOW (ref 150–400)
RBC: 2.4 MIL/uL — ABNORMAL LOW (ref 4.22–5.81)
RBC: 2.35 MIL/uL — ABNORMAL LOW (ref 4.22–5.81)
RDW: 23.9 % — ABNORMAL HIGH (ref 11.5–15.5)
RDW: 23.9 % — AB (ref 11.5–15.5)
WBC: 2.4 10*3/uL — ABNORMAL LOW (ref 4.0–10.5)
WBC: 2.5 10*3/uL — AB (ref 4.0–10.5)

## 2018-04-03 LAB — GLUCOSE, CAPILLARY
Glucose-Capillary: 108 mg/dL — ABNORMAL HIGH (ref 70–99)
Glucose-Capillary: 106 mg/dL — ABNORMAL HIGH (ref 70–99)
Glucose-Capillary: 102 mg/dL — ABNORMAL HIGH (ref 70–99)
Glucose-Capillary: 91 mg/dL (ref 70–99)

## 2018-04-03 LAB — PROTIME-INR
INR: 1.08
PROTHROMBIN TIME: 13.9 s (ref 11.4–15.2)

## 2018-04-03 LAB — ECHOCARDIOGRAM COMPLETE
Height: 67 in
Weight: 2843.2 oz

## 2018-04-03 LAB — BASIC METABOLIC PANEL
Anion gap: 9 (ref 5–15)
BUN: 22 mg/dL (ref 8–23)
CO2: 23 mmol/L (ref 22–32)
CREATININE: 1.26 mg/dL — AB (ref 0.61–1.24)
Calcium: 9.1 mg/dL (ref 8.9–10.3)
Chloride: 108 mmol/L (ref 98–111)
GFR calc Af Amer: >60 mL/min (ref >60)
GFR calc non Af Amer: 59 mL/min — ABNORMAL LOW (ref >60)
GLUCOSE: 127 mg/dL — AB (ref 70–99)
Potassium: 4.7 mmol/L (ref 3.5–5.1)
Sodium: 140 mmol/L (ref 135–145)

## 2018-04-03 LAB — TROPONIN I: Troponin I: <0.03 ng/mL (ref <0.03)

## 2018-04-03 SURGERY — ESOPHAGOGASTRODUODENOSCOPY (EGD) WITH PROPOFOL
Anesthesia: Monitor Anesthesia Care

## 2018-04-03 MED ORDER — SODIUM CHLORIDE 0.9 % IV SOLN
INTRAVENOUS | Status: DC
  Administered 2018-04-04: 13:00:00 via INTRAVENOUS

## 2018-04-03 MED ORDER — PEG 3350-KCL-NA BICARB-NACL 420 G PO SOLR
4000.0000 mL | Freq: Once | ORAL | Status: AC
  Administered 2018-04-03: 4000 mL via ORAL
  Filled 2018-04-03: qty 4000

## 2018-04-03 MED ORDER — PROPOFOL 500 MG/50ML IV EMUL
INTRAVENOUS | Status: DC | PRN
  Administered 2018-04-03: 100 ug/kg/min via INTRAVENOUS

## 2018-04-03 MED ORDER — LIDOCAINE HCL (CARDIAC) PF 100 MG/5ML IV SOSY
PREFILLED_SYRINGE | INTRAVENOUS | Status: DC | PRN
  Administered 2018-04-03: 100 mg via INTRATRACHEAL

## 2018-04-03 MED ORDER — SODIUM CHLORIDE 0.9 % IV SOLN
INTRAVENOUS | Status: DC
  Administered 2018-04-03: 13:00:00 via INTRAVENOUS

## 2018-04-03 SURGICAL SUPPLY — 15 items

## 2018-04-03 NOTE — Consult Note (Addendum)
Referring Provider: [facility] Primary Care Physician:  [name], MD Primary Gastroenterologist:  Dr. Watt Climes  Reason for Consultation:  Anemia, Occult blood positive   HPI: Andrew [name] is a 67 y.o. male presented to hospital with worsening generalized weakness. Apparently has been having anemia since February 2019. He started getting worsening symptoms. He was found to have a pancytopenia with hemoglobin of 3.6 on admission. Occult blood positive. GI is consulted for further evaluation.  Patient seen and examined at bedside. Family at bedside. He had 1 episode of black appearing stool 2 days ago. No bowel movement in last 2 days. He denied any bright blood per rectum. Denied nausea vomiting. Denied dysphagia or odynophagia. Denied any abdominal pain. Had loose stools for 2 days which is resolved now.complaining of some weight loss as well.  Colonoscopy 01/2012 by Dr. Watt Climes showed internal and external hemorrhoids otherwise normal. Repeat was recommended in 10 years. No family history of colon cancer.  Past Medical History:  Diagnosis Date  . Abdominal bloating    probable lactose intolorence  . Anemia 04/03/2018  . Asthma   . COPD (chronic obstructive pulmonary disease) (HCC)    mild improvement  . DM (diabetes mellitus) ([place])   . Hearing impairment    asymptomatic  . HTN (hypertension)   . Tobacco use   . Vitamin D deficiency     Past Surgical History:  Procedure Laterality Date  . EYE SURGERY      Prior to Admission medications   Medication Sig Start Date End Date Taking? Authorizing Provider  amLODipine (NORVASC) 10 MG tablet Take 10 mg by mouth daily.   Yes [provider]  aspirin 81 MG tablet Take 81 mg by mouth daily.   Yes [provider]  benazepril (LOTENSIN) 40 MG tablet Take 40 mg by mouth daily. 03/11/18  Yes [provider]  cholecalciferol (VITAMIN D) 1000 UNITS tablet Take 1,000 Units by mouth daily.   Yes [provider]   dorzolamide-timolol (COSOPT) 22.3-6.8 MG/ML ophthalmic solution Place 1 drop into both eyes 2 (two) times daily. 02/22/18  Yes [provider]  gemfibrozil (LOPID) 600 MG tablet Take 600 mg by mouth 2 (two) times daily. 03/11/18  Yes [provider]  metoprolol (LOPRESSOR) 50 MG tablet Take 25 mg by mouth daily.    Yes [provider]  tetrahydrozoline-zinc (VISINE-AC) 0.05-0.25 % ophthalmic solution 2 drops as needed.   Yes [provider]  TRAVATAN Z 0.004 % SOLN ophthalmic solution Place 1 drop into both eyes every evening. 02/22/18  Yes [provider]  triamterene-hydrochlorothiazide (MAXZIDE-25) 37.5-25 MG per tablet Take 1 tablet by mouth daily.   Yes [provider]  VENTOLIN HFA 108 (90 Base) MCG/ACT inhaler Inhale 1-2 puffs into the lungs 4 (four) times daily as needed. 02/15/18  Yes [provider]    Scheduled Meds: . insulin aspart  0-9 Units Subcutaneous TID WC   Continuous Infusions: . pantoprozole (PROTONIX) infusion 8 mg/hr (04/03/18 0524)   PRN Meds:.acetaminophen **OR** acetaminophen, levalbuterol, ondansetron **OR** ondansetron (ZOFRAN) IV  Allergies as of 04/02/2018  . (No Known Allergies)    History reviewed. No pertinent family history.  Social History   Socioeconomic History  . Marital status: Single    Spouse name: Not on file  . Number of children: Not on file  . Years of education: Not on file  . Highest education level: Not on file  Occupational History  . Not on file  Social Needs  .  Financial resource strain: Not on file  . Food insecurity:    Worry: Not on file    Inability: Not on file  . Transportation needs:    Medical: Not on file    Non-medical: Not on file  Tobacco Use  . Smoking status: Former Smoker    Packs/day: 0.25    Types: Cigarettes    Last attempt to quit: 03/02/2018    Years since quitting: 0.0  . Smokeless tobacco: Never Used  Substance and Sexual Activity  .  Alcohol use: Yes    Frequency: Never    Comment: occ  . Drug use: Never  . Sexual activity: Not on file  Lifestyle  . Physical activity:    Days per week: Not on file    Minutes per session: Not on file  . Stress: Not on file  Relationships  . Social connections:    Talks on phone: Not on file    Gets together: Not on file    Attends religious service: Not on file    Active member of club or organization: Not on file    Attends meetings of clubs or organizations: Not on file    Relationship status: Not on file  . Intimate partner violence:    Fear of current or ex partner: Not on file    Emotionally abused: Not on file    Physically abused: Not on file    Forced sexual activity: Not on file  Other Topics Concern  . Not on file  Social History Narrative  . Not on file    Review of Systems: Review of Systems  Constitutional: Positive for malaise/fatigue and weight loss. Negative for chills and fever.  HENT: Negative for hearing loss and tinnitus.   Eyes: Negative for blurred vision and double vision.  Respiratory: Positive for shortness of breath. Negative for cough and hemoptysis.   Cardiovascular: Negative for chest pain and palpitations.  Gastrointestinal: Positive for diarrhea and melena. Negative for abdominal pain, heartburn, nausea and vomiting.  Genitourinary: Negative for dysuria and urgency.  Musculoskeletal: Positive for myalgias. Negative for falls.  Skin: Negative for itching and rash.  Neurological: Negative for seizures and loss of consciousness.  Endo/Heme/Allergies: Does not bruise/bleed easily.  Psychiatric/Behavioral: Negative for hallucinations and suicidal ideas.    Physical Exam: Vital signs: Vitals:   04/03/18 0425 04/03/18 0615  BP: (!) 153/59 (!) 116/48  Pulse: 79 79  Resp: 18 18  Temp: 98.3 F (36.8 C) 98.3 F (36.8 C)  SpO2: 100%    Last BM Date: 04/02/18 Physical Exam  Constitutional: He is oriented to person, place, and time. He  appears well-developed and well-nourished.  HENT:  Head: Normocephalic and atraumatic.  Mouth/Throat: Oropharynx is clear and moist. No oropharyngeal exudate.  Eyes: EOM are normal. No scleral icterus.  Neck: Normal range of motion. Neck supple. No thyromegaly present.  Cardiovascular: Normal rate, regular rhythm and normal heart sounds.  Pulmonary/Chest: Effort normal and breath sounds normal. No respiratory distress.  Abdominal: Soft. Bowel sounds are normal. He exhibits no distension. There is no tenderness. There is no rebound and no guarding.  Musculoskeletal: Normal range of motion. He exhibits no edema.  Neurological: He is alert and oriented to person, place, and time.  Skin: Skin is warm. No erythema.  Psychiatric: He has a normal mood and affect. Judgment normal.  Vitals reviewed.   GI:  Lab Results: Recent Labs    04/02/18 1459 04/03/18 0802  WBC 2.5* 2.4*  HGB 3.6*  7.8*  HCT 11.4* 23.9*  PLT 102* 83*   BMET Recent Labs    04/02/18 1413 04/02/18 1459 04/03/18 0802  NA 136 136 140  K 5.5* 5.6* 4.7  CL 106 107 108  CO2 21* 23 23  GLUCOSE 118* 112* 127*  BUN 39* 40* 22  CREATININE 2.08* 2.31* 1.26*  CALCIUM 9.0 8.9 9.1   LFT Recent Labs    04/02/18 1459  PROT 7.4  ALBUMIN 3.7  AST 11*  ALT 11  ALKPHOS 68  BILITOT 0.4  BILIDIR <0.1  IBILI NOT CALCULATED   PT/INR Recent Labs    04/03/18 0802  LABPROT 13.9  INR 1.08     Studies/Results: Ct Abdomen Pelvis Wo Contrast  Result Date: 04/02/2018 CLINICAL DATA:  Dyspnea on exertion, abdominal distention. EXAM: CT CHEST, ABDOMEN AND PELVIS WITHOUT CONTRAST TECHNIQUE: Multidetector CT imaging of the chest, abdomen and pelvis was performed following the standard protocol without IV contrast. COMPARISON:  None. FINDINGS: CT CHEST FINDINGS Cardiovascular: Atherosclerosis of thoracic aorta is noted without aneurysm formation. Normal cardiac size. No pericardial effusion. Mediastinum/Nodes: No enlarged  mediastinal, hilar, or axillary lymph nodes. Thyroid gland, trachea, and esophagus demonstrate no significant findings. Lungs/Pleura: No pneumothorax or pleural effusion is noted. Emphysematous disease is noted in the upper lobes bilaterally. 7 mm subpleural nodule is noted in right lower lobe best seen on image number 99 of series 4. No acute consolidative process is noted. Musculoskeletal: No chest wall mass or suspicious bone lesions identified. CT ABDOMEN PELVIS FINDINGS Hepatobiliary: No focal liver abnormality is seen. No gallstones, gallbladder wall thickening, or biliary dilatation. Pancreas: Unremarkable. No pancreatic ductal dilatation or surrounding inflammatory changes. Spleen: Normal in size without focal abnormality. Adrenals/Urinary Tract: Adrenal glands appear normal. Small nonobstructive left renal calculus is noted. No hydronephrosis or renal obstruction is noted. No ureteral calculi is noted. Urinary bladder is unremarkable. Stomach/Bowel: Stomach is within normal limits. Appendix appears normal. No evidence of bowel wall thickening, distention, or inflammatory changes. Vascular/Lymphatic: Aortic atherosclerosis. No enlarged abdominal or pelvic lymph nodes. Reproductive: Mild prostatic enlargement is noted. Other: No abdominal wall hernia or abnormality. No abdominopelvic ascites. Musculoskeletal: No acute or significant osseous findings. IMPRESSION: 7 mm subpleural nodule seen in right lower lobe. Non-contrast chest CT at 6-12 months is recommended. If the nodule is stable at time of repeat CT, then future CT at 18-24 months (from today's scan) is considered optional for low-risk patients, but is recommended for high-risk patients. This recommendation follows the consensus statement: Guidelines for Management of Incidental Pulmonary Nodules Detected on CT Images: From the Fleischner Society 2017; Radiology 2017; 284:228-243. Small nonobstructive left renal calculus. No hydronephrosis or renal  obstruction is noted. Mild prostatic enlargement. Aortic Atherosclerosis (ICD10-I70.0) and Emphysema (ICD10-J43.9). Electronically Signed   By: Marijo Conception, M.D.   On: 04/02/2018 21:34   Ct Chest Wo Contrast  Result Date: 04/02/2018 CLINICAL DATA:  Dyspnea on exertion, abdominal distention. EXAM: CT CHEST, ABDOMEN AND PELVIS WITHOUT CONTRAST TECHNIQUE: Multidetector CT imaging of the chest, abdomen and pelvis was performed following the standard protocol without IV contrast. COMPARISON:  None. FINDINGS: CT CHEST FINDINGS Cardiovascular: Atherosclerosis of thoracic aorta is noted without aneurysm formation. Normal cardiac size. No pericardial effusion. Mediastinum/Nodes: No enlarged mediastinal, hilar, or axillary lymph nodes. Thyroid gland, trachea, and esophagus demonstrate no significant findings. Lungs/Pleura: No pneumothorax or pleural effusion is noted. Emphysematous disease is noted in the upper lobes bilaterally. 7 mm subpleural nodule is noted in right lower lobe best seen  on image number 99 of series 4. No acute consolidative process is noted. Musculoskeletal: No chest wall mass or suspicious bone lesions identified. CT ABDOMEN PELVIS FINDINGS Hepatobiliary: No focal liver abnormality is seen. No gallstones, gallbladder wall thickening, or biliary dilatation. Pancreas: Unremarkable. No pancreatic ductal dilatation or surrounding inflammatory changes. Spleen: Normal in size without focal abnormality. Adrenals/Urinary Tract: Adrenal glands appear normal. Small nonobstructive left renal calculus is noted. No hydronephrosis or renal obstruction is noted. No ureteral calculi is noted. Urinary bladder is unremarkable. Stomach/Bowel: Stomach is within normal limits. Appendix appears normal. No evidence of bowel wall thickening, distention, or inflammatory changes. Vascular/Lymphatic: Aortic atherosclerosis. No enlarged abdominal or pelvic lymph nodes. Reproductive: Mild prostatic enlargement is noted.  Other: No abdominal wall hernia or abnormality. No abdominopelvic ascites. Musculoskeletal: No acute or significant osseous findings. IMPRESSION: 7 mm subpleural nodule seen in right lower lobe. Non-contrast chest CT at 6-12 months is recommended. If the nodule is stable at time of repeat CT, then future CT at 18-24 months (from today's scan) is considered optional for low-risk patients, but is recommended for high-risk patients. This recommendation follows the consensus statement: Guidelines for Management of Incidental Pulmonary Nodules Detected on CT Images: From the Fleischner Society 2017; Radiology 2017; 284:228-243. Small nonobstructive left renal calculus. No hydronephrosis or renal obstruction is noted. Mild prostatic enlargement. Aortic Atherosclerosis (ICD10-I70.0) and Emphysema (ICD10-J43.9). Electronically Signed   By: Marijo Conception, M.D.   On: 04/02/2018 21:34   Dg Chest Port 1 View  Result Date: 04/02/2018 CLINICAL DATA:  Weakness EXAM: PORTABLE CHEST 1 VIEW COMPARISON:  November 04, 2017 FINDINGS: Lungs are clear. Heart is borderline prominent with pulmonary vascularity normal. No adenopathy. There is aortic atherosclerosis. No evident bone lesions. IMPRESSION: Heart borderline prominent. No edema or consolidation. There is aortic atherosclerosis. Aortic Atherosclerosis (ICD10-I70.0). Electronically Signed   By: Lowella Grip III M.D.   On: 04/02/2018 14:36    Impression/Plan: - symptomatic anemia with hemoglobin of 3.8 on admission.occult blood positive stool - 1 episode of melena 2 days ago.  - pancytopenia - History of COPD  Recommendations ------------------------- - EGD today.if EGD negative, consider colonoscopy for further evaluation. - Further workup for pancytopenia per primary team/hematology.  Risks (bleeding, infection, bowel perforation that could require surgery, sedation-related changes in cardiopulmonary systems), benefits (identification and possible treatment  of source of symptoms, exclusion of certain causes of symptoms), and alternatives (watchful waiting, radiographic imaging studies, empiric medical treatment)  were explained to patient and family in detail and patient wishes to proceed.    LOS: 1 day   Otis Brace  MD, FACP 04/03/2018, 11:59 AM  Contact #  [phone]

## 2018-04-03 NOTE — Anesthesia Preprocedure Evaluation (Addendum)
Anesthesia Evaluation  Patient identified by MRN, date of birth, ID band Patient awake    Reviewed: Allergy & Precautions, NPO status , Patient's Chart, lab work & pertinent test results  Airway Mallampati: IV  TM Distance: >3 FB Neck ROM: Full    Dental  (+) Chipped,    Pulmonary asthma , COPD,  COPD inhaler, former smoker,    Pulmonary exam normal breath sounds clear to auscultation       Cardiovascular hypertension, Pt. on medications and Pt. on home beta blockers Normal cardiovascular exam Rhythm:Regular Rate:Normal  ECG: SR, 1st degree AV block, RBBB  ECHO: LV EF: 55% -   60%   Neuro/Psych negative neurological ROS  negative psych ROS   GI/Hepatic negative GI ROS, Neg liver ROS,   Endo/Other  diabetes, Oral Hypoglycemic Agents  Renal/GU negative Renal ROS     Musculoskeletal negative musculoskeletal ROS (+)   Abdominal   Peds  Hematology  (+) anemia , Thrombocytopenia   Anesthesia Other Findings Anemia, Melena, Occult blood positive stool  Reproductive/Obstetrics                           Anesthesia Physical Anesthesia Plan  ASA: III  Anesthesia Plan: MAC   Post-op Pain Management:    Induction: Intravenous  PONV Risk Score and Plan: 1  Airway Management Planned: Nasal Cannula  Additional Equipment:   Intra-op Plan:   Post-operative Plan:   Informed Consent: I have reviewed the patients History and Physical, chart, labs and discussed the procedure including the risks, benefits and alternatives for the proposed anesthesia with the patient or authorized representative who has indicated his/her understanding and acceptance.   Dental advisory given  Plan Discussed with: CRNA  Anesthesia Plan Comments:         Anesthesia Quick Evaluation

## 2018-04-03 NOTE — Anesthesia Postprocedure Evaluation (Signed)
Anesthesia Post Note  Patient: Andrew [name]  Procedure(s) Performed: ESOPHAGOGASTRODUODENOSCOPY (EGD) WITH PROPOFOL (N/A ) BIOPSY     Patient location during evaluation: PACU Anesthesia Type: MAC Level of consciousness: awake and alert Pain management: pain level controlled Vital Signs Assessment: post-procedure vital signs reviewed and stable Respiratory status: spontaneous breathing, nonlabored ventilation, respiratory function stable and patient connected to nasal cannula oxygen Cardiovascular status: stable and blood pressure returned to baseline Postop Assessment: no apparent nausea or vomiting Anesthetic complications: no    Last Vitals:  Vitals:   04/03/18 1335 04/03/18 1516  BP: (!) 136/45   Pulse: 82 80  Resp: 17 13  Temp:    SpO2: 100% 100%    Last Pain:  Vitals:   04/03/18 1335  TempSrc:   PainSc: 0-No pain                 Panayiota Larkin P Zylpha Poynor

## 2018-04-03 NOTE — Progress Notes (Signed)
  Echocardiogram 2D Echocardiogram has been performed.  Jennette Dubin 04/03/2018, 10:15 AM

## 2018-04-03 NOTE — Progress Notes (Signed)
PROGRESS NOTE    MACE WEINBERG  DJM:[id number] DOB: Sep 21, 1951 DOA: 04/02/2018 PCP: [name], MD    Brief Narrative: 67 year old male with a history of diabetes, COPD, hypertension presents with shortness of breath, dizziness for a few months now. On arrival to ED he was found to be hypotensive and tachycardic. Lab work work revealed severe profound anemia with pancytopenia. He underwent 4 units of prbc transfusion and repeat h&H is around 7.  Meanwhile GI and Hematology consulted for severe anemia, with guaiac positive stools and pancytopenia.     Assessment & Plan:   Principal Problem:   Pancytopenia ([place]) Active Problems:   HTN (hypertension)   COPD (chronic obstructive pulmonary disease) (HCC)   DM (diabetes mellitus) (HCC)   Allergic rhinitis    Pancytopenia:  LDH wnl.  ANC IS 1100.  Platelets at 83,000.  Wbc count is around 2.4.  Smear available .  Will need bone marrow biopsy to evaluate for MDS.  Hematology consulted for further evaluation of pancytopenia.    Severe anemia:  Anemia of chronic disease. Anemia panel reviewed.  guaiac positive stools.  melena few days ago.  On PPI IV, GI consulted for possible EGD and colonoscopy.  Transfuse to keep hemoglobin greater than 7.     Acute renal failure with hyperkalemia  Suspect pre renal. CT abd does not show any hydronephrosis.  Get UA.     Hypotension  Resolved with IV fluids.    COPD:  No wheezing heard.  Resume Duo Neb treatments    Hyperkalemia : Resolved with hydration.    Diabetes Mellitus:  CBG (last 3)  Recent Labs    04/02/18 2323 04/03/18 0730 04/03/18 1213  GLUCAP 108* 108* 106*   A1c is 7.3, probably false in view of the severe anemia. Recommend checking in 6 weeks.  Resume SSI for now.   Intentional weight loss: CT chest and abdomen , pelvis shows 7 mm subpleural nodule seen in right lower lobe. Non-contrast chest CT at 6-12 months is recommended. If the nodule is  stable at time of repeat CT, then future CT at 18-24 months (from today's scan) is considered optional for low-risk patients, but is recommended for high-risk patients  DVT prophylaxis: SCD'S Code Status: full code.  Family Communication: none at bedside.  Disposition Plan: pending evaluation by EGD and bone marrow biopsy.    Consultants:   Gastroenterology Dr Alessandra Bevels  IR Consult for Bone Marrow Biopsy  Dr [name] for Hematology.    Procedures: Echocardiogram.   EGd today by Dr Alessandra Bevels    Antimicrobials: none.    Subjective: No chest pain or sob at this time, no bm today.   Objective: Vitals:   04/03/18 0415 04/03/18 0425 04/03/18 0500 04/03/18 0615  BP: 136/62 (!) 153/59  (!) 116/48  Pulse: 85 79  79  Resp: '17 18  18  '$ Temp: 98.2 F (36.8 C) 98.3 F (36.8 C)  98.3 F (36.8 C)  TempSrc: Oral Oral  Oral  SpO2: 100% 100%    Weight:   80.6 kg (177 lb 11.2 oz)   Height:        Intake/Output Summary (Last 24 hours) at 04/03/2018 1222 Last data filed at 04/03/2018 0615 Gross per 24 hour  Intake 2663 ml  Output 1275 ml  Net 1388 ml   Filed Weights   04/02/18 1402 04/02/18 1815 04/03/18 0500  Weight: 77.1 kg (170 lb) 83.5 kg (184 lb 1.4 oz) 80.6 kg (177 lb 11.2 oz)  Examination:  General exam: Appears calm and comfortable  Respiratory system: Clear to auscultation. Respiratory effort normal. Cardiovascular system: S1 & S2 heard, RRR. No JVD, murmurs,  Gastrointestinal system: Abdomen is nondistended, soft and nontender. No organomegaly or masses felt. Normal bowel sounds heard. Central nervous system: Alert and oriented. No focal neurological deficits. Extremities: Symmetric 5 x 5 power. Skin: No rashes, lesions or ulcers Psychiatry:  Mood & affect appropriate.     Data Reviewed: I have personally reviewed following labs and imaging studies  CBC: Recent Labs  Lab 04/02/18 1459 04/03/18 0802  WBC 2.5* 2.4*  NEUTROABS 1.1*  --   HGB 3.6* 7.8*   HCT 11.4* 23.9*  MCV 131.0* 99.6  PLT 102* 83*   Basic Metabolic Panel: Recent Labs  Lab 04/02/18 1413 04/02/18 1459 04/02/18 1854 04/03/18 0802  NA 136 136  --  140  K 5.5* 5.6*  --  4.7  CL 106 107  --  108  CO2 21* 23  --  23  GLUCOSE 118* 112*  --  127*  BUN 39* 40*  --  22  CREATININE 2.08* 2.31*  --  1.26*  CALCIUM 9.0 8.9  --  9.1  MG  --   --  3.0*  --    GFR: Estimated Creatinine Clearance: 60.2 mL/min (A) (by C-G formula based on SCr of 1.26 mg/dL (H)). Liver Function Tests: Recent Labs  Lab 04/02/18 1459  AST 11*  ALT 11  ALKPHOS 68  BILITOT 0.4  PROT 7.4  ALBUMIN 3.7   No results for input(s): LIPASE, AMYLASE in the last 168 hours. No results for input(s): AMMONIA in the last 168 hours. Coagulation Profile: Recent Labs  Lab 04/03/18 0802  INR 1.08   Cardiac Enzymes: Recent Labs  Lab 04/02/18 1854 04/03/18 0802  TROPONINI <0.03 <0.03   BNP (last 3 results) No results for input(s): PROBNP in the last 8760 hours. HbA1C: Recent Labs    04/02/18 1854  HGBA1C 7.3*   CBG: Recent Labs  Lab 04/02/18 2323 04/03/18 0730 04/03/18 1213  GLUCAP 108* 108* 106*   Lipid Profile: No results for input(s): CHOL, HDL, LDLCALC, TRIG, CHOLHDL, LDLDIRECT in the last 72 hours. Thyroid Function Tests: Recent Labs    04/02/18 1854  TSH 0.532  FREET4 0.73*   Anemia Panel: Recent Labs    04/02/18 1622 04/02/18 1624 04/02/18 1854  VITAMINB12 1,519*  --  1,162*  FOLATE  --  12.3 9.7  FERRITIN 81  --  86  TIBC 515*  --  512*  IRON 148  --  133  RETICCTPCT 2.5  --  2.3   Sepsis Labs: No results for input(s): PROCALCITON, LATICACIDVEN in the last 168 hours.  No results found for this or any previous visit (from the past 240 hour(s)).       Radiology Studies: Ct Abdomen Pelvis Wo Contrast  Result Date: 04/02/2018 CLINICAL DATA:  Dyspnea on exertion, abdominal distention. EXAM: CT CHEST, ABDOMEN AND PELVIS WITHOUT CONTRAST TECHNIQUE:  Multidetector CT imaging of the chest, abdomen and pelvis was performed following the standard protocol without IV contrast. COMPARISON:  None. FINDINGS: CT CHEST FINDINGS Cardiovascular: Atherosclerosis of thoracic aorta is noted without aneurysm formation. Normal cardiac size. No pericardial effusion. Mediastinum/Nodes: No enlarged mediastinal, hilar, or axillary lymph nodes. Thyroid gland, trachea, and esophagus demonstrate no significant findings. Lungs/Pleura: No pneumothorax or pleural effusion is noted. Emphysematous disease is noted in the upper lobes bilaterally. 7 mm subpleural nodule is noted in right  lower lobe best seen on image number 99 of series 4. No acute consolidative process is noted. Musculoskeletal: No chest wall mass or suspicious bone lesions identified. CT ABDOMEN PELVIS FINDINGS Hepatobiliary: No focal liver abnormality is seen. No gallstones, gallbladder wall thickening, or biliary dilatation. Pancreas: Unremarkable. No pancreatic ductal dilatation or surrounding inflammatory changes. Spleen: Normal in size without focal abnormality. Adrenals/Urinary Tract: Adrenal glands appear normal. Small nonobstructive left renal calculus is noted. No hydronephrosis or renal obstruction is noted. No ureteral calculi is noted. Urinary bladder is unremarkable. Stomach/Bowel: Stomach is within normal limits. Appendix appears normal. No evidence of bowel wall thickening, distention, or inflammatory changes. Vascular/Lymphatic: Aortic atherosclerosis. No enlarged abdominal or pelvic lymph nodes. Reproductive: Mild prostatic enlargement is noted. Other: No abdominal wall hernia or abnormality. No abdominopelvic ascites. Musculoskeletal: No acute or significant osseous findings. IMPRESSION: 7 mm subpleural nodule seen in right lower lobe. Non-contrast chest CT at 6-12 months is recommended. If the nodule is stable at time of repeat CT, then future CT at 18-24 months (from today's scan) is considered optional  for low-risk patients, but is recommended for high-risk patients. This recommendation follows the consensus statement: Guidelines for Management of Incidental Pulmonary Nodules Detected on CT Images: From the Fleischner Society 2017; Radiology 2017; 284:228-243. Small nonobstructive left renal calculus. No hydronephrosis or renal obstruction is noted. Mild prostatic enlargement. Aortic Atherosclerosis (ICD10-I70.0) and Emphysema (ICD10-J43.9). Electronically Signed   By: Marijo Conception, M.D.   On: 04/02/2018 21:34   Ct Chest Wo Contrast  Result Date: 04/02/2018 CLINICAL DATA:  Dyspnea on exertion, abdominal distention. EXAM: CT CHEST, ABDOMEN AND PELVIS WITHOUT CONTRAST TECHNIQUE: Multidetector CT imaging of the chest, abdomen and pelvis was performed following the standard protocol without IV contrast. COMPARISON:  None. FINDINGS: CT CHEST FINDINGS Cardiovascular: Atherosclerosis of thoracic aorta is noted without aneurysm formation. Normal cardiac size. No pericardial effusion. Mediastinum/Nodes: No enlarged mediastinal, hilar, or axillary lymph nodes. Thyroid gland, trachea, and esophagus demonstrate no significant findings. Lungs/Pleura: No pneumothorax or pleural effusion is noted. Emphysematous disease is noted in the upper lobes bilaterally. 7 mm subpleural nodule is noted in right lower lobe best seen on image number 99 of series 4. No acute consolidative process is noted. Musculoskeletal: No chest wall mass or suspicious bone lesions identified. CT ABDOMEN PELVIS FINDINGS Hepatobiliary: No focal liver abnormality is seen. No gallstones, gallbladder wall thickening, or biliary dilatation. Pancreas: Unremarkable. No pancreatic ductal dilatation or surrounding inflammatory changes. Spleen: Normal in size without focal abnormality. Adrenals/Urinary Tract: Adrenal glands appear normal. Small nonobstructive left renal calculus is noted. No hydronephrosis or renal obstruction is noted. No ureteral calculi is  noted. Urinary bladder is unremarkable. Stomach/Bowel: Stomach is within normal limits. Appendix appears normal. No evidence of bowel wall thickening, distention, or inflammatory changes. Vascular/Lymphatic: Aortic atherosclerosis. No enlarged abdominal or pelvic lymph nodes. Reproductive: Mild prostatic enlargement is noted. Other: No abdominal wall hernia or abnormality. No abdominopelvic ascites. Musculoskeletal: No acute or significant osseous findings. IMPRESSION: 7 mm subpleural nodule seen in right lower lobe. Non-contrast chest CT at 6-12 months is recommended. If the nodule is stable at time of repeat CT, then future CT at 18-24 months (from today's scan) is considered optional for low-risk patients, but is recommended for high-risk patients. This recommendation follows the consensus statement: Guidelines for Management of Incidental Pulmonary Nodules Detected on CT Images: From the Fleischner Society 2017; Radiology 2017; 284:228-243. Small nonobstructive left renal calculus. No hydronephrosis or renal obstruction is noted. Mild prostatic enlargement.  Aortic Atherosclerosis (ICD10-I70.0) and Emphysema (ICD10-J43.9). Electronically Signed   By: Marijo Conception, M.D.   On: 04/02/2018 21:34   Dg Chest Port 1 View  Result Date: 04/02/2018 CLINICAL DATA:  Weakness EXAM: PORTABLE CHEST 1 VIEW COMPARISON:  November 04, 2017 FINDINGS: Lungs are clear. Heart is borderline prominent with pulmonary vascularity normal. No adenopathy. There is aortic atherosclerosis. No evident bone lesions. IMPRESSION: Heart borderline prominent. No edema or consolidation. There is aortic atherosclerosis. Aortic Atherosclerosis (ICD10-I70.0). Electronically Signed   By: Lowella Grip III M.D.   On: 04/02/2018 14:36        Scheduled Meds: . insulin aspart  0-9 Units Subcutaneous TID WC   Continuous Infusions: . sodium chloride    . pantoprozole (PROTONIX) infusion 8 mg/hr (04/03/18 0524)     LOS: 1 day    Time  spent: 35 minutes.     Hosie Poisson, MD [facility] Pager [phone]   If 7PM-7AM, please contact night-coverage [email or web address] Password [facility] 04/03/2018, 12:22 PM

## 2018-04-03 NOTE — Progress Notes (Signed)
Pt updated on plan for EGD. Pt made NPO at this time - last intake ~9am, had broth.

## 2018-04-03 NOTE — Transfer of Care (Signed)
Immediate Anesthesia Transfer of Care Note  Patient: Andrew [name]  Procedure(s) Performed: ESOPHAGOGASTRODUODENOSCOPY (EGD) WITH PROPOFOL (N/A )  Patient Location: PACU and Endoscopy Unit  Anesthesia Type:MAC  Level of Consciousness: awake, patient cooperative and responds to stimulation  Airway & Oxygen Therapy: Patient Spontanous Breathing  Post-op Assessment: Report given to RN and Post -op Vital signs reviewed and stable  Post vital signs: Reviewed and stable  Last Vitals:  Vitals Value Taken Time  BP 114/42 04/03/2018  1:12 PM  Temp    Pulse 84 04/03/2018  1:12 PM  Resp 19 04/03/2018  1:12 PM  SpO2 100 % 04/03/2018  1:12 PM  Vitals shown include unvalidated device data.  Last Pain:  Vitals:   04/03/18 1234  TempSrc: Oral  PainSc: 0-No pain         Complications: No apparent anesthesia complications

## 2018-04-03 NOTE — Op Note (Signed)
[facility] Patient Name: Andrew [name] Procedure Date : 04/03/2018 MRN: [id number] Attending MD: Otis Brace , MD Date of Birth: 10/04/1950 CSN: [id number] Age: 67 Admit Type: Inpatient Procedure:                Upper GI endoscopy Indications:              Heme positive stool, Suspected upper                            gastrointestinal bleeding Providers:                Otis Brace, MD, Angus Seller, Tinnie Gens,                            Technician, Judeth Cornfield, CRNA Referring MD:              Medicines:                Sedation Administered by an Anesthesia Professional Complications:            No immediate complications. Estimated Blood Loss:     Estimated blood loss was minimal. Procedure:                Pre-Anesthesia Assessment:                           - Prior to the procedure, a History and Physical                            was performed, and patient medications and                            allergies were reviewed. The patient's tolerance of                            previous anesthesia was also reviewed. The risks                            and benefits of the procedure and the sedation                            options and risks were discussed with the patient.                            All questions were answered, and informed consent                            was obtained. Prior Anticoagulants: The patient has                            taken aspirin, last dose was 1 day prior to                            procedure. ASA Grade Assessment: III - A patient  with severe systemic disease. After reviewing the                            risks and benefits, the patient was deemed in                            satisfactory condition to undergo the procedure.                           After obtaining informed consent, the endoscope was                            passed under direct vision. Throughout the                  procedure, the patient's blood pressure, pulse, and                            oxygen saturations were monitored continuously. The                            was introduced through the mouth, and advanced to                            the second part of duodenum. The upper GI endoscopy                            was technically difficult and complex due to the                            patient's agitation and the patient's                            combativeness. Successful completion of the                            procedure was aided by increasing the dose of                            sedation medication. The patient tolerated the                            procedure fairly well. Scope In: Scope Out: Findings:      The Z-line was regular and was found 42 cm from the incisors.      The exam of the esophagus was otherwise normal.      Scattered mild inflammation characterized by congestion (edema),       erythema and friability was found in the entire examined stomach.       Biopsies were taken with a cold forceps for Helicobacter pylori testing.      The cardia and gastric fundus were normal on retroflexion.      Diffuse moderate inflammation characterized by congestion (edema),       erythema and friability was found in the duodenal bulb. Biopsies were       taken with a cold forceps  for histology.      The first portion of the duodenum and second portion of the duodenum       were normal. Impression:               - Z-line regular, 42 cm from the incisors.                           - Chronic gastritis. Biopsied.                           - Duodenitis. Biopsied.                           - Normal first portion of the duodenum and second                            portion of the duodenum. Recommendation:           - Return patient to hospital ward for ongoing care.                           - Clear liquid diet.                           - Continue present  medications.                           - Await pathology results. Procedure Code(s):        --- Professional ---                           [phone], Esophagogastroduodenoscopy, flexible,                            transoral; with biopsy, single or multiple Diagnosis Code(s):        --- Professional ---                           K29.50, Unspecified chronic gastritis without                            bleeding                           K29.80, Duodenitis without bleeding                           R19.5, Other fecal abnormalities CPT copyright 2017 American Medical Association. All rights reserved. The codes documented in this report are preliminary and upon coder review may  be revised to meet current compliance requirements. Otis Brace, MD Otis Brace, MD 04/03/2018 1:12:53 PM Number of Addenda: 0

## 2018-04-03 NOTE — Consult Note (Signed)
New Hematology/Oncology Consult   Referral ZC:HYIFOY Akula      Reason for Referral: Pancytopenia  HPI: Andrew [name] presented to the emergency room with dizziness on 04/02/2018.  A CBC was remarkable for a hemoglobin of 3.6, MCV 131, platelets 102,000, white count 2.5, and absolute neutrophil count 1.1.  He was admitted for further evaluation.  He was transfused 4 units of packed red blood cells.  He reports one dark stool several days ago.  The stool was Hemoccult positive.    Past Medical History:  Diagnosis Date  . Abdominal bloating    probable lactose intolorence  .  Pancytopenia 04/03/2018  . Asthma   . COPD (chronic obstructive pulmonary disease) (HCC)    mild improvement  . DM (diabetes mellitus) ([place])   . Hearing impairment    asymptomatic  . HTN (hypertension)   . Tobacco use   . Vitamin D deficiency   :  Past Surgical History:  Procedure Laterality Date  . BIOPSY  04/03/2018   Procedure: BIOPSY;  Surgeon: Otis Brace, MD;  Location: [facility];  Service: Gastroenterology;;  . ESOPHAGOGASTRODUODENOSCOPY (EGD) WITH PROPOFOL N/A 04/03/2018   Procedure: ESOPHAGOGASTRODUODENOSCOPY (EGD) WITH PROPOFOL;  Surgeon: Otis Brace, MD;  Location: [facility];  Service: Gastroenterology;  Laterality: N/A;  . EYE SURGERY    :   Current Facility-Administered Medications:  .  0.9 %  sodium chloride infusion, , Intravenous, Continuous, Brahmbhatt, Parag, MD .  acetaminophen (TYLENOL) tablet 650 mg, 650 mg, Oral, Q6H PRN **OR** acetaminophen (TYLENOL) suppository 650 mg, 650 mg, Rectal, Q6H PRN, Abrol, Nayana, MD .  insulin aspart (novoLOG) injection 0-9 Units, 0-9 Units, Subcutaneous, TID WC, Abrol, Nayana, MD .  levalbuterol (XOPENEX) nebulizer solution 0.63 mg, 0.63 mg, Nebulization, Q6H PRN, Abrol, Nayana, MD .  ondansetron (ZOFRAN) tablet 4 mg, 4 mg, Oral, Q6H PRN **OR** ondansetron (ZOFRAN) injection 4 mg, 4 mg, Intravenous, Q6H PRN, Abrol, Nayana, MD .   pantoprazole (PROTONIX) 80 mg in sodium chloride 0.9 % 250 mL (0.32 mg/mL) infusion, 8 mg/hr, Intravenous, Continuous, Drenda Freeze, MD, Last Rate: 25 mL/hr at 04/03/18 1640, 8 mg/hr at 04/03/18 1640:  . insulin aspart  0-9 Units Subcutaneous TID WC  :  Allergies  Allergen Reactions  . Shellfish Allergy     " I BREAK OUT IN HIVES "  :  FH: Noncontributory  SOCIAL HISTORY: He lives alone in [place].  He is retired from the police department.  He drinks approximately 2 beers each night.  He quit smoking cigarettes in January of this year.  Review of Systems:  Positives include: Blurred vision and dizziness prior to hospital admission, malaise, dark stool several days ago  A complete ROS was otherwise negative.   Physical Exam:  Blood pressure (!) 136/45, pulse 80, temperature 98.6 F (37 C), temperature source Oral, resp. rate 13, height '5\' 7"'$  (1.702 m), weight 177 lb 11.2 oz (80.6 kg), SpO2 100 %.  HEENT: Oropharynx without visible mass, neck without mass Lungs: Clear bilaterally Cardiac: Regular rate and rhythm Abdomen: No hepatosplenomegaly, no mass, nontender GU: Testes without mass Vascular: No leg edema Lymph nodes: No cervical, supraclavicular, axillary, or inguinal nodes Neurologic: Alert and oriented, the motor exam appears intact in the upper and lower extremities Skin: No rash Musculoskeletal: No spine tenderness  LABS:  Recent Labs    04/03/18 0802 04/03/18 1506  WBC 2.4* 2.5*  HGB 7.8* 7.8*  HCT 23.9* 23.8*  PLT 83* 86*  Review of the peripheral blood smear  from 04/02/2018: The polychromasia is not increased.  There are a few teardrops, ovalocytes, and helmet cells.  Large abnormally shaped platelets.  There are hypolobated neutrophils.  No blasts.  Few nucleated red cells  Reticulocyte count 57.3  Recent Labs    04/02/18 1459 04/03/18 0802  NA 136 140  K 5.6* 4.7  CL 107 108  CO2 23 23  GLUCOSE 112* 127*  BUN 40* 22  CREATININE 2.31*  1.26*  CALCIUM 8.9 9.1   LDH 99 Ferritin 86 Vitamin B12 1162   RADIOLOGY:  Ct Abdomen Pelvis Wo Contrast  Result Date: 04/02/2018 CLINICAL DATA:  Dyspnea on exertion, abdominal distention. EXAM: CT CHEST, ABDOMEN AND PELVIS WITHOUT CONTRAST TECHNIQUE: Multidetector CT imaging of the chest, abdomen and pelvis was performed following the standard protocol without IV contrast. COMPARISON:  None. FINDINGS: CT CHEST FINDINGS Cardiovascular: Atherosclerosis of thoracic aorta is noted without aneurysm formation. Normal cardiac size. No pericardial effusion. Mediastinum/Nodes: No enlarged mediastinal, hilar, or axillary lymph nodes. Thyroid gland, trachea, and esophagus demonstrate no significant findings. Lungs/Pleura: No pneumothorax or pleural effusion is noted. Emphysematous disease is noted in the upper lobes bilaterally. 7 mm subpleural nodule is noted in right lower lobe best seen on image number 99 of series 4. No acute consolidative process is noted. Musculoskeletal: No chest wall mass or suspicious bone lesions identified. CT ABDOMEN PELVIS FINDINGS Hepatobiliary: No focal liver abnormality is seen. No gallstones, gallbladder wall thickening, or biliary dilatation. Pancreas: Unremarkable. No pancreatic ductal dilatation or surrounding inflammatory changes. Spleen: Normal in size without focal abnormality. Adrenals/Urinary Tract: Adrenal glands appear normal. Small nonobstructive left renal calculus is noted. No hydronephrosis or renal obstruction is noted. No ureteral calculi is noted. Urinary bladder is unremarkable. Stomach/Bowel: Stomach is within normal limits. Appendix appears normal. No evidence of bowel wall thickening, distention, or inflammatory changes. Vascular/Lymphatic: Aortic atherosclerosis. No enlarged abdominal or pelvic lymph nodes. Reproductive: Mild prostatic enlargement is noted. Other: No abdominal wall hernia or abnormality. No abdominopelvic ascites. Musculoskeletal: No acute or  significant osseous findings. IMPRESSION: 7 mm subpleural nodule seen in right lower lobe. Non-contrast chest CT at 6-12 months is recommended. If the nodule is stable at time of repeat CT, then future CT at 18-24 months (from today's scan) is considered optional for low-risk patients, but is recommended for high-risk patients. This recommendation follows the consensus statement: Guidelines for Management of Incidental Pulmonary Nodules Detected on CT Images: From the Fleischner Society 2017; Radiology 2017; 284:228-243. Small nonobstructive left renal calculus. No hydronephrosis or renal obstruction is noted. Mild prostatic enlargement. Aortic Atherosclerosis (ICD10-I70.0) and Emphysema (ICD10-J43.9). Electronically Signed   By: Marijo Conception, M.D.   On: 04/02/2018 21:34   Ct Chest Wo Contrast  Result Date: 04/02/2018 CLINICAL DATA:  Dyspnea on exertion, abdominal distention. EXAM: CT CHEST, ABDOMEN AND PELVIS WITHOUT CONTRAST TECHNIQUE: Multidetector CT imaging of the chest, abdomen and pelvis was performed following the standard protocol without IV contrast. COMPARISON:  None. FINDINGS: CT CHEST FINDINGS Cardiovascular: Atherosclerosis of thoracic aorta is noted without aneurysm formation. Normal cardiac size. No pericardial effusion. Mediastinum/Nodes: No enlarged mediastinal, hilar, or axillary lymph nodes. Thyroid gland, trachea, and esophagus demonstrate no significant findings. Lungs/Pleura: No pneumothorax or pleural effusion is noted. Emphysematous disease is noted in the upper lobes bilaterally. 7 mm subpleural nodule is noted in right lower lobe best seen on image number 99 of series 4. No acute consolidative process is noted. Musculoskeletal: No chest wall mass or suspicious bone lesions identified. CT ABDOMEN  PELVIS FINDINGS Hepatobiliary: No focal liver abnormality is seen. No gallstones, gallbladder wall thickening, or biliary dilatation. Pancreas: Unremarkable. No pancreatic ductal dilatation  or surrounding inflammatory changes. Spleen: Normal in size without focal abnormality. Adrenals/Urinary Tract: Adrenal glands appear normal. Small nonobstructive left renal calculus is noted. No hydronephrosis or renal obstruction is noted. No ureteral calculi is noted. Urinary bladder is unremarkable. Stomach/Bowel: Stomach is within normal limits. Appendix appears normal. No evidence of bowel wall thickening, distention, or inflammatory changes. Vascular/Lymphatic: Aortic atherosclerosis. No enlarged abdominal or pelvic lymph nodes. Reproductive: Mild prostatic enlargement is noted. Other: No abdominal wall hernia or abnormality. No abdominopelvic ascites. Musculoskeletal: No acute or significant osseous findings. IMPRESSION: 7 mm subpleural nodule seen in right lower lobe. Non-contrast chest CT at 6-12 months is recommended. If the nodule is stable at time of repeat CT, then future CT at 18-24 months (from today's scan) is considered optional for low-risk patients, but is recommended for high-risk patients. This recommendation follows the consensus statement: Guidelines for Management of Incidental Pulmonary Nodules Detected on CT Images: From the Fleischner Society 2017; Radiology 2017; 284:228-243. Small nonobstructive left renal calculus. No hydronephrosis or renal obstruction is noted. Mild prostatic enlargement. Aortic Atherosclerosis (ICD10-I70.0) and Emphysema (ICD10-J43.9). Electronically Signed   By: Marijo Conception, M.D.   On: 04/02/2018 21:34   Dg Chest Port 1 View  Result Date: 04/02/2018 CLINICAL DATA:  Weakness EXAM: PORTABLE CHEST 1 VIEW COMPARISON:  November 04, 2017 FINDINGS: Lungs are clear. Heart is borderline prominent with pulmonary vascularity normal. No adenopathy. There is aortic atherosclerosis. No evident bone lesions. IMPRESSION: Heart borderline prominent. No edema or consolidation. There is aortic atherosclerosis. Aortic Atherosclerosis (ICD10-I70.0). Electronically Signed   By:  Lowella Grip III M.D.   On: 04/02/2018 14:36    Assessment and Plan:   1.  Pancytopenia red cell macrocytosis 2.  Renal insufficiency 3.  Diabetes 4.  Hemoccult positive stool  Upper endoscopy 04/03/2018- chronic gastritis, duodenitis 5.  Right lower lobe 7 mm subpleural nodule on chest CT 04/02/2018  Andrew [name] was admitted with severe symptomatic anemia.  He has pancytopenia with red cell macrocytosis.  The vitamin B12 level is normal.  There is abnormal morphology of the red cells, white cells, and platelets on the peripheral blood smear from hospital admission.  I suspect a primary bone marrow process.  The differential diagnosis includes a myelodysplastic syndrome, leukemia, multiple myeloma, lymphoma, and an infiltrative bone marrow process.  Recommendations: 1.  Diagnostic bone marrow biopsy with bone marrow aspirate to be sent for flow cytometry and cytogenetics analysis 2.  Outpatient follow-up will be scheduled in the hematology clinic  Betsy Coder, MD 04/03/2018, 7:15 PM

## 2018-04-04 ENCOUNTER — Inpatient Hospital Stay (HOSPITAL_COMMUNITY): Payer: BLUE CROSS/BLUE SHIELD | Admitting: Anesthesiology

## 2018-04-04 ENCOUNTER — Encounter (HOSPITAL_COMMUNITY)
Admission: EM | Disposition: A | Discharge status: Home / Self Care | Payer: Self-pay | Source: Home / Self Care | Attending: Internal Medicine

## 2018-04-04 ENCOUNTER — Encounter (HOSPITAL_COMMUNITY): Payer: Self-pay | Admitting: Physician Assistant

## 2018-04-04 ENCOUNTER — Inpatient Hospital Stay (HOSPITAL_COMMUNITY): Payer: BLUE CROSS/BLUE SHIELD

## 2018-04-04 DIAGNOSIS — J439 Emphysema, unspecified: Secondary | ICD-10-CM | POA: Diagnosis not present

## 2018-04-04 DIAGNOSIS — D649 Anemia, unspecified: Secondary | ICD-10-CM | POA: Diagnosis not present

## 2018-04-04 DIAGNOSIS — E0800 Diabetes mellitus due to underlying condition with hyperosmolarity without nonketotic hyperglycemic-hyperosmolar coma (NKHHC): Secondary | ICD-10-CM | POA: Diagnosis not present

## 2018-04-04 DIAGNOSIS — D61818 Other pancytopenia: Secondary | ICD-10-CM | POA: Diagnosis not present

## 2018-04-04 HISTORY — PX: COLONOSCOPY WITH PROPOFOL: SHX5780

## 2018-04-04 LAB — CBC
HCT: 22.2 % — ABNORMAL LOW (ref 39.0–52.0)
HEMATOCRIT: 22 % — AB (ref 39.0–52.0)
HEMOGLOBIN: 7.2 g/dL — AB (ref 13.0–17.0)
HEMOGLOBIN: 7.2 g/dL — AB (ref 13.0–17.0)
MCH: 32.7 pg (ref 26.0–34.0)
MCH: 33.3 pg (ref 26.0–34.0)
MCHC: 32.4 g/dL (ref 30.0–36.0)
MCHC: 32.7 g/dL (ref 30.0–36.0)
MCV: 100.9 fL — ABNORMAL HIGH (ref 78.0–100.0)
MCV: 101.9 fL — ABNORMAL HIGH (ref 78.0–100.0)
Platelets: 80 10*3/uL — ABNORMAL LOW (ref 150–400)
Platelets: 80 10*3/uL — ABNORMAL LOW (ref 150–400)
RBC: 2.2 MIL/uL — ABNORMAL LOW (ref 4.22–5.81)
RBC: 2.16 MIL/uL — ABNORMAL LOW (ref 4.22–5.81)
RDW: 22.9 % — ABNORMAL HIGH (ref 11.5–15.5)
RDW: 23.1 % — AB (ref 11.5–15.5)
WBC: 2.9 10*3/uL — ABNORMAL LOW (ref 4.0–10.5)
WBC: 2.4 10*3/uL — ABNORMAL LOW (ref 4.0–10.5)

## 2018-04-04 LAB — HEMOGLOBIN AND HEMATOCRIT, BLOOD
HCT: 26.6 % — ABNORMAL LOW (ref 39.0–52.0)
HEMOGLOBIN: 8.4 g/dL — AB (ref 13.0–17.0)

## 2018-04-04 LAB — BASIC METABOLIC PANEL
Anion gap: 9 (ref 5–15)
BUN: 14 mg/dL (ref 8–23)
CALCIUM: 9 mg/dL (ref 8.9–10.3)
CO2: 26 mmol/L (ref 22–32)
Chloride: 105 mmol/L (ref 98–111)
Creatinine, Ser: 1.2 mg/dL (ref 0.61–1.24)
Glucose, Bld: 93 mg/dL (ref 70–99)
Potassium: 4.3 mmol/L (ref 3.5–5.1)
SODIUM: 140 mmol/L (ref 135–145)

## 2018-04-04 LAB — GLUCOSE, CAPILLARY
GLUCOSE-CAPILLARY: 74 mg/dL (ref 70–99)
Glucose-Capillary: 86 mg/dL (ref 70–99)
Glucose-Capillary: 77 mg/dL (ref 70–99)

## 2018-04-04 LAB — PREPARE RBC (CROSSMATCH)

## 2018-04-04 LAB — MRSA PCR SCREENING: MRSA BY PCR: NEGATIVE

## 2018-04-04 SURGERY — COLONOSCOPY WITH PROPOFOL
Anesthesia: Monitor Anesthesia Care

## 2018-04-04 MED ORDER — PROPOFOL 10 MG/ML IV BOLUS
INTRAVENOUS | Status: DC | PRN
  Administered 2018-04-04: 40 mg via INTRAVENOUS
  Administered 2018-04-04 (×5): 20 mg via INTRAVENOUS

## 2018-04-04 MED ORDER — SODIUM CHLORIDE 0.9% IV SOLUTION: Freq: Once | INTRAVENOUS | Status: DC

## 2018-04-04 MED ORDER — PANTOPRAZOLE SODIUM 40 MG PO TBEC: 40.0000 mg | DELAYED_RELEASE_TABLET | Freq: Two times a day (BID) | ORAL | Status: DC

## 2018-04-04 MED ORDER — PANTOPRAZOLE SODIUM 40 MG PO TBEC: 40.0000 mg | DELAYED_RELEASE_TABLET | Freq: Two times a day (BID) | ORAL | 0 refills | Status: AC

## 2018-04-04 MED ORDER — FENTANYL CITRATE (PF) 100 MCG/2ML IJ SOLN
INTRAMUSCULAR | Status: AC
  Filled 2018-04-04: qty 4

## 2018-04-04 MED ORDER — FENTANYL CITRATE (PF) 100 MCG/2ML IJ SOLN
INTRAMUSCULAR | Status: AC | PRN
  Administered 2018-04-04: 50 ug via INTRAVENOUS

## 2018-04-04 MED ORDER — MIDAZOLAM HCL 5 MG/5ML IJ SOLN
INTRAMUSCULAR | Status: AC | PRN
  Administered 2018-04-04: 1 mg via INTRAVENOUS

## 2018-04-04 MED ORDER — MIDAZOLAM HCL 2 MG/2ML IJ SOLN
INTRAMUSCULAR | Status: AC
  Filled 2018-04-04: qty 4

## 2018-04-04 MED ORDER — PROPOFOL 500 MG/50ML IV EMUL
INTRAVENOUS | Status: DC | PRN
  Administered 2018-04-04: 75 ug/kg/min via INTRAVENOUS

## 2018-04-04 SURGICAL SUPPLY — 22 items

## 2018-04-04 NOTE — Op Note (Signed)
[facility] Patient Name: Andrew [name] Procedure Date : 04/04/2018 MRN: [id number] Attending MD: Otis Brace , MD Date of Birth: 02/12/1951 CSN: [id number] Age: 67 Admit Type: Inpatient Procedure:                Colonoscopy Indications:              Heme positive stool, Unexplained iron deficiency                            anemia Providers:                Otis Brace, MD, Burtis Junes, RN, Cherylynn Ridges,                            Technician, Claybon Jabs CRNA, CRNA Referring MD:              Medicines:                Sedation Administered by an Anesthesia Professional Complications:            No immediate complications. Estimated Blood Loss:     Estimated blood loss: none. Procedure:                Pre-Anesthesia Assessment:                           - Prior to the procedure, a History and Physical                            was performed, and patient medications and                            allergies were reviewed. The patient's tolerance of                            previous anesthesia was also reviewed. The risks                            and benefits of the procedure and the sedation                            options and risks were discussed with the patient.                            All questions were answered, and informed consent                            was obtained. Prior Anticoagulants: The patient has                            taken no previous anticoagulant or antiplatelet                            agents. ASA Grade Assessment: III - A patient with  severe systemic disease. After reviewing the risks                            and benefits, the patient was deemed in                            satisfactory condition to undergo the procedure.                           After obtaining informed consent, the colonoscope                            was passed under direct vision. Throughout the    procedure, the patient's blood pressure, pulse, and                            oxygen saturations were monitored continuously. The                            EC-[id number] ([id number]) scope was introduced through                            the anus and advanced to the the terminal ileum,                            with identification of the appendiceal orifice and                            IC valve. The colonoscopy was performed without                            difficulty. The patient tolerated the procedure                            well. The quality of the bowel preparation was fair. Scope In: 2:32:43 PM Scope Out: 2:51:27 PM Scope Withdrawal Time: 0 hours 11 minutes 45 seconds  Total Procedure Duration: 0 hours 18 minutes 44 seconds  Findings:      The perianal and digital rectal examinations were normal.      The terminal ileum appeared normal.      A moderate amount of liquid stool was found in the sigmoid colon, in the       descending colon, in the transverse colon, in the ascending colon and in       the cecum, interfering with visualization. Lavage of the area was       performed, resulting in clearance with fair visualization.      A few medium-mouthed diverticula were found in the sigmoid colon.      Internal hemorrhoids were found during retroflexion. The hemorrhoids       were small. Impression:               - Preparation of the colon was fair.                           - The examined portion of the ileum was normal.                           -  Stool in the sigmoid colon, in the descending                            colon, in the transverse colon, in the ascending                            colon and in the cecum.                           - Diverticulosis in the sigmoid colon.                           - Internal hemorrhoids.                           - No specimens collected. Recommendation:           - Return patient to hospital ward for ongoing care.                            - Resume previous diet.                           - Continue present medications. Procedure Code(s):        --- Professional ---                           [phone], Colonoscopy, flexible; diagnostic, including                            collection of specimen(s) by brushing or washing,                            when performed (separate procedure) Diagnosis Code(s):        --- Professional ---                           K64.8, Other hemorrhoids                           R19.5, Other fecal abnormalities                           D50.9, Iron deficiency anemia, unspecified                           K57.30, Diverticulosis of large intestine without                            perforation or abscess without bleeding CPT copyright 2017 American Medical Association. All rights reserved. The codes documented in this report are preliminary and upon coder review may  be revised to meet current compliance requirements. Otis Brace, MD Otis Brace, MD 04/04/2018 2:59:01 PM Number of Addenda: 0

## 2018-04-04 NOTE — Progress Notes (Signed)
IP PROGRESS NOTE  Subjective:   Andrew [name] has no complaint.  Objective: Vital signs in last 24 hours: Blood pressure 138/63, pulse 78, temperature 98.5 F (36.9 C), temperature source Oral, resp. rate (!) 27, height '5\' 7"'$  (1.702 m), weight 177 lb 0.7 oz (80.3 kg), SpO2 100 %.  Intake/Output from previous day: 07/11 0701 - 07/12 0700 In: 985.9 [I.V.:985.9] Out: -   Physical Exam: Not performed today Lab Results: Recent Labs    04/04/18 0027 04/04/18 0610  WBC 2.9* 2.4*  HGB 7.2* 7.2*  HCT 22.2* 22.0*  PLT 80* 80*    BMET Recent Labs    04/03/18 0802 04/04/18 0610  NA 140 140  K 4.7 4.3  CL 108 105  CO2 23 26  GLUCOSE 127* 93  BUN 22 14  CREATININE 1.26* 1.20  CALCIUM 9.1 9.0    No results found for: CEA1  Studies/Results: Ct Abdomen Pelvis Wo Contrast  Result Date: 04/02/2018 CLINICAL DATA:  Dyspnea on exertion, abdominal distention. EXAM: CT CHEST, ABDOMEN AND PELVIS WITHOUT CONTRAST TECHNIQUE: Multidetector CT imaging of the chest, abdomen and pelvis was performed following the standard protocol without IV contrast. COMPARISON:  None. FINDINGS: CT CHEST FINDINGS Cardiovascular: Atherosclerosis of thoracic aorta is noted without aneurysm formation. Normal cardiac size. No pericardial effusion. Mediastinum/Nodes: No enlarged mediastinal, hilar, or axillary lymph nodes. Thyroid gland, trachea, and esophagus demonstrate no significant findings. Lungs/Pleura: No pneumothorax or pleural effusion is noted. Emphysematous disease is noted in the upper lobes bilaterally. 7 mm subpleural nodule is noted in right lower lobe best seen on image number 99 of series 4. No acute consolidative process is noted. Musculoskeletal: No chest wall mass or suspicious bone lesions identified. CT ABDOMEN PELVIS FINDINGS Hepatobiliary: No focal liver abnormality is seen. No gallstones, gallbladder wall thickening, or biliary dilatation. Pancreas: Unremarkable. No pancreatic ductal dilatation or  surrounding inflammatory changes. Spleen: Normal in size without focal abnormality. Adrenals/Urinary Tract: Adrenal glands appear normal. Small nonobstructive left renal calculus is noted. No hydronephrosis or renal obstruction is noted. No ureteral calculi is noted. Urinary bladder is unremarkable. Stomach/Bowel: Stomach is within normal limits. Appendix appears normal. No evidence of bowel wall thickening, distention, or inflammatory changes. Vascular/Lymphatic: Aortic atherosclerosis. No enlarged abdominal or pelvic lymph nodes. Reproductive: Mild prostatic enlargement is noted. Other: No abdominal wall hernia or abnormality. No abdominopelvic ascites. Musculoskeletal: No acute or significant osseous findings. IMPRESSION: 7 mm subpleural nodule seen in right lower lobe. Non-contrast chest CT at 6-12 months is recommended. If the nodule is stable at time of repeat CT, then future CT at 18-24 months (from today's scan) is considered optional for low-risk patients, but is recommended for high-risk patients. This recommendation follows the consensus statement: Guidelines for Management of Incidental Pulmonary Nodules Detected on CT Images: From the Fleischner Society 2017; Radiology 2017; 284:228-243. Small nonobstructive left renal calculus. No hydronephrosis or renal obstruction is noted. Mild prostatic enlargement. Aortic Atherosclerosis (ICD10-I70.0) and Emphysema (ICD10-J43.9). Electronically Signed   By: Marijo Conception, M.D.   On: 04/02/2018 21:34   Ct Chest Wo Contrast  Result Date: 04/02/2018 CLINICAL DATA:  Dyspnea on exertion, abdominal distention. EXAM: CT CHEST, ABDOMEN AND PELVIS WITHOUT CONTRAST TECHNIQUE: Multidetector CT imaging of the chest, abdomen and pelvis was performed following the standard protocol without IV contrast. COMPARISON:  None. FINDINGS: CT CHEST FINDINGS Cardiovascular: Atherosclerosis of thoracic aorta is noted without aneurysm formation. Normal cardiac size. No pericardial  effusion. Mediastinum/Nodes: No enlarged mediastinal, hilar, or axillary lymph nodes. Thyroid  gland, trachea, and esophagus demonstrate no significant findings. Lungs/Pleura: No pneumothorax or pleural effusion is noted. Emphysematous disease is noted in the upper lobes bilaterally. 7 mm subpleural nodule is noted in right lower lobe best seen on image number 99 of series 4. No acute consolidative process is noted. Musculoskeletal: No chest wall mass or suspicious bone lesions identified. CT ABDOMEN PELVIS FINDINGS Hepatobiliary: No focal liver abnormality is seen. No gallstones, gallbladder wall thickening, or biliary dilatation. Pancreas: Unremarkable. No pancreatic ductal dilatation or surrounding inflammatory changes. Spleen: Normal in size without focal abnormality. Adrenals/Urinary Tract: Adrenal glands appear normal. Small nonobstructive left renal calculus is noted. No hydronephrosis or renal obstruction is noted. No ureteral calculi is noted. Urinary bladder is unremarkable. Stomach/Bowel: Stomach is within normal limits. Appendix appears normal. No evidence of bowel wall thickening, distention, or inflammatory changes. Vascular/Lymphatic: Aortic atherosclerosis. No enlarged abdominal or pelvic lymph nodes. Reproductive: Mild prostatic enlargement is noted. Other: No abdominal wall hernia or abnormality. No abdominopelvic ascites. Musculoskeletal: No acute or significant osseous findings. IMPRESSION: 7 mm subpleural nodule seen in right lower lobe. Non-contrast chest CT at 6-12 months is recommended. If the nodule is stable at time of repeat CT, then future CT at 18-24 months (from today's scan) is considered optional for low-risk patients, but is recommended for high-risk patients. This recommendation follows the consensus statement: Guidelines for Management of Incidental Pulmonary Nodules Detected on CT Images: From the Fleischner Society 2017; Radiology 2017; 284:228-243. Small nonobstructive left renal  calculus. No hydronephrosis or renal obstruction is noted. Mild prostatic enlargement. Aortic Atherosclerosis (ICD10-I70.0) and Emphysema (ICD10-J43.9). Electronically Signed   By: Marijo Conception, M.D.   On: 04/02/2018 21:34   Ct Bone Marrow Biopsy  Result Date: 04/04/2018 INDICATION: 67 year old male with a history of anemia EXAM: CT BIOPSY BONE MARROW MEDICATIONS: None. ANESTHESIA/SEDATION: Moderate (conscious) sedation was employed during this procedure. A total of Versed 1.0 mg and Fentanyl 50 mcg was administered intravenously. Moderate Sedation Time: 10 minutes. The patient's level of consciousness and vital signs were monitored continuously by radiology nursing throughout the procedure under my direct supervision. FLUOROSCOPY TIME:  CT COMPLICATIONS: None PROCEDURE: The procedure risks, benefits, and alternatives were explained to the patient. Questions regarding the procedure were encouraged and answered. The patient understands and consents to the procedure. Scout CT of the pelvis was performed for surgical planning purposes. The posterior pelvis was prepped with chlorhexidinein a sterile fashion, and a sterile drape was applied covering the operative field. A sterile gown and sterile gloves were used for the procedure. Local anesthesia was provided with 1% Lidocaine. We targeted the right posterior iliac bone for biopsy. The skin and subcutaneous tissues were infiltrated with 1% lidocaine without epinephrine. A small stab incision was made with an 11 blade scalpel, and an 11 gauge Murphy needle was advanced with CT guidance to the posterior cortex. Manual forced was used to advance the needle through the posterior cortex and the stylet was removed. A bone marrow aspirate was retrieved and passed to a cytotechnologist in the room. The Murphy needle was then advanced without the stylet for a core biopsy. The core biopsy was retrieved and also passed to a cytotechnologist. Manual pressure was used for  hemostasis and a sterile dressing was placed. No complications were encountered no significant blood loss was encountered. Patient tolerated the procedure well and remained hemodynamically stable throughout. IMPRESSION: Status post CT-guided bone marrow biopsy, with tissue specimen sent to pathology for complete histopathologic analysis Signed, Dulcy Fanny. Earleen Newport,  DO [facility] Radiology Electronically Signed   By: Corrie Mckusick D.O.   On: 04/04/2018 14:32    Medications: I have reviewed the patient's current medications.  Assessment/Plan:  1.  Pancytopenia red cell macrocytosis 2.  Renal insufficiency 3.  Diabetes 4.  Hemoccult positive stool  Upper endoscopy 04/03/2018- chronic gastritis, duodenitis 5.  Right lower lobe 7 mm subpleural nodule on chest CT 04/02/2018   Andrew [name] appears stable.  Hemoglobin is stable.  He is scheduled for a bone marrow biopsy and colonoscopy today.  Recommendations: 1.  Bone marrow biopsy today 2.  Consider transfusing 1 additional unit of packed red blood cells prior to discharge 3.  Outpatient follow-up at the Cancer center will be scheduled for the week of 04/07/2018  Please call hematology over the weekend as needed.   LOS: 2 days   Betsy Coder, MD   04/04/2018, 4:04 PM

## 2018-04-04 NOTE — Brief Op Note (Signed)
04/02/2018 - 04/04/2018  2:59 PM  PATIENT:  Andrew [name]  67 y.o. male  PRE-OPERATIVE DIAGNOSIS:  Anemia, heme positive stool  POST-OPERATIVE DIAGNOSIS:  hemrrhoid, diverticulosis  PROCEDURE:  Procedure(s): COLONOSCOPY WITH PROPOFOL (N/A)  SURGEON:  Surgeon(s) and Role:    * Tamiah Dysart, MD - Primary  Findings ----------- - Endoscopy showed fair prep, diverticulosis and hemorrhoids. No evidence of active bleeding.  Recommendations ------------------------ - Advance diet - Monitor H&H - No further inpatient GI workup planned. - He should continue his routine colon cancer screening surveillance because of fair prep during today's colonoscopy. - GI will sign off. Call us back if needed  Otis Brace MD, [place] 04/04/2018, 3:00 PM  Contact #  [phone]

## 2018-04-04 NOTE — Transfer of Care (Signed)
Immediate Anesthesia Transfer of Care Note  Patient: Andrew [name]  Procedure(s) Performed: COLONOSCOPY WITH PROPOFOL (N/A )  Patient Location: Endoscopy Unit  Anesthesia Type:MAC  Level of Consciousness: awake and patient cooperative  Airway & Oxygen Therapy: Patient Spontanous Breathing  Post-op Assessment: Report given to RN, Post -op Vital signs reviewed and stable and Patient moving all extremities  Post vital signs: Reviewed and stable  Last Vitals:  Vitals Value Taken Time  BP    Temp    Pulse    Resp    SpO2      Last Pain:  Vitals:   04/04/18 1324  TempSrc: Oral  PainSc:          Complications: No apparent anesthesia complications

## 2018-04-04 NOTE — Progress Notes (Signed)
PROGRESS NOTE    Andrew [name]  ZDG:[id number] DOB: 1951-09-13 DOA: 04/02/2018 PCP: [name], MD    Brief Narrative: 67 year old male with a history of diabetes, COPD, hypertension presents with shortness of breath, dizziness for a few months now. On arrival to ED he was found to be hypotensive and tachycardic. Lab work work revealed severe profound anemia with pancytopenia. He underwent 4 units of prbc transfusion and repeat h&H is around 7.  Meanwhile GI and Hematology consulted for severe anemia, with guaiac positive stools and pancytopenia.     Assessment & Plan:   Principal Problem:   Pancytopenia ([place]) Active Problems:   HTN (hypertension)   COPD (chronic obstructive pulmonary disease) (HCC)   DM (diabetes mellitus) (HCC)   Allergic rhinitis    Pancytopenia:  LDH wnl.  Wbc count is around 2.4.  Smear available .  CT guided Bone marrow Biopsy done, recommend to follow up the results with Dr [name] as outpatient.     Severe anemia:  Anemia of chronic disease. Anemia panel reviewed.  guaiac positive stools.  melena few days ago.  On PPI IV, GI consulted for possible EGD and colonoscopy. EGD unremarkable, plan for colonoscopy today.  Transfuse to keep hemoglobin greater than 7. As per Dr [name] recommendations , 1 more unit of PRBC transfusion ordered.     Acute renal failure with hyperkalemia  Suspect pre renal. CT abd does not show any hydronephrosis.  Resolved with hydratin.     Hypotension  Resolved with IV fluids.    COPD:  No wheezing heard.  Resume Duo Neb treatments    Hyperkalemia : Resolved with hydration.   Abnormal free t4, but TSH is wnl.    Diabetes Mellitus:  CBG (last 3)  Recent Labs    04/03/18 1633 04/03/18 2131 04/04/18 0936  GLUCAP 102* 91 86   A1c is 7.3, probably false in view of the severe anemia. Recommend checking in 6 weeks.  Resume SSI for now. No changes.   Intentional weight loss: CT chest and abdomen  , pelvis shows 7 mm subpleural nodule seen in right lower lobe. Non-contrast chest CT at 6-12 months is recommended. If the nodule is stable at time of repeat CT, then future CT at 18-24 months (from today's scan) is considered optional for low-risk patients, but is recommended for high-risk patients  DVT prophylaxis: SCD'S Code Status: full code.  Family Communication: none at bedside.  Disposition Plan: pending evaluation by GI with a colonoscopy.    Consultants:   Gastroenterology Dr Alessandra Bevels  IR Consult for Bone Marrow Biopsy  Dr [name] for Hematology.    Procedures: Echocardiogram.   EGd  And colonoscopy Dr Alessandra Bevels    Antimicrobials: none.    Subjective: No chest pain or sob, no nausea, vomiting or abdominal pain.    Objective: Vitals:   04/04/18 1109 04/04/18 1115 04/04/18 1120 04/04/18 1125  BP: (!) 147/59 134/63 124/62 123/68  Pulse: 84 84 88 89  Resp: '17 18 19 18  '$ Temp:      TempSrc:      SpO2: 100% 100% 100% 100%  Weight:      Height:        Intake/Output Summary (Last 24 hours) at 04/04/2018 1149 Last data filed at 04/03/2018 1639 Gross per 24 hour  Intake 985.88 ml  Output -  Net 985.88 ml   Filed Weights   04/02/18 1815 04/03/18 0500 04/04/18 0500  Weight: 83.5 kg (184 lb 1.4 oz) 80.6 kg (  177 lb 11.2 oz) 81.8 kg (180 lb 5.4 oz)    Examination:  General exam: Appears calm and comfortable not in any kind of distress Respiratory system: Clear to auscultation. Respiratory effort normal.no wheezing or rhonchi.  Cardiovascular system: S1 & S2 heard, RRR. No JVD, murmurs,  Gastrointestinal system: Abdomen is soft NT ND BS+ Central nervous system: Alert and oriented. No focal neurological deficits. Extremities: Symmetric 5 x 5 power. Skin: No rashes, lesions or ulcers Psychiatry:  Mood & affect appropriate.     Data Reviewed: I have personally reviewed following labs and imaging studies  CBC: Recent Labs  Lab 04/02/18 1459  04/03/18 0802 04/03/18 1506 04/04/18 0027 04/04/18 0610  WBC 2.5* 2.4* 2.5* 2.9* 2.4*  NEUTROABS 1.1*  --   --   --   --   HGB 3.6* 7.8* 7.8* 7.2* 7.2*  HCT 11.4* 23.9* 23.8* 22.2* 22.0*  MCV 131.0* 99.6 101.3* 100.9* 101.9*  PLT 102* 83* 86* 80* 80*   Basic Metabolic Panel: Recent Labs  Lab 04/02/18 1413 04/02/18 1459 04/02/18 1854 04/03/18 0802 04/04/18 0610  NA 136 136  --  140 140  K 5.5* 5.6*  --  4.7 4.3  CL 106 107  --  108 105  CO2 21* 23  --  23 26  GLUCOSE 118* 112*  --  127* 93  BUN 39* 40*  --  22 14  CREATININE 2.08* 2.31*  --  1.26* 1.20  CALCIUM 9.0 8.9  --  9.1 9.0  MG  --   --  3.0*  --   --    GFR: Estimated Creatinine Clearance: 63.7 mL/min (by C-G formula based on SCr of 1.2 mg/dL). Liver Function Tests: Recent Labs  Lab 04/02/18 1459  AST 11*  ALT 11  ALKPHOS 68  BILITOT 0.4  PROT 7.4  ALBUMIN 3.7   No results for input(s): LIPASE, AMYLASE in the last 168 hours. No results for input(s): AMMONIA in the last 168 hours. Coagulation Profile: Recent Labs  Lab 04/03/18 0802  INR 1.08   Cardiac Enzymes: Recent Labs  Lab 04/02/18 1854 04/03/18 0802  TROPONINI <0.03 <0.03   BNP (last 3 results) No results for input(s): PROBNP in the last 8760 hours. HbA1C: Recent Labs    04/02/18 1854  HGBA1C 7.3*   CBG: Recent Labs  Lab 04/03/18 0730 04/03/18 1213 04/03/18 1633 04/03/18 2131 04/04/18 0936  GLUCAP 108* 106* 102* 91 86   Lipid Profile: No results for input(s): CHOL, HDL, LDLCALC, TRIG, CHOLHDL, LDLDIRECT in the last 72 hours. Thyroid Function Tests: Recent Labs    04/02/18 1854  TSH 0.532  FREET4 0.73*   Anemia Panel: Recent Labs    04/02/18 1622 04/02/18 1624 04/02/18 1854  VITAMINB12 1,519*  --  1,162*  FOLATE  --  12.3 9.7  FERRITIN 81  --  86  TIBC 515*  --  512*  IRON 148  --  133  RETICCTPCT 2.5  --  2.3   Sepsis Labs: No results for input(s): PROCALCITON, LATICACIDVEN in the last 168 hours.  Recent  Results (from the past 240 hour(s))  MRSA PCR Screening     Status: None   Collection Time: 04/03/18 10:40 PM  Result Value Ref Range Status   MRSA by PCR NEGATIVE NEGATIVE Final    Comment:        The GeneXpert MRSA Assay (FDA approved for NASAL specimens only), is one component of a comprehensive MRSA colonization surveillance program. It is not  intended to diagnose MRSA infection nor to guide or monitor treatment for MRSA infections. Performed at [facility], [place] [address] [place], [place] [address]          Radiology Studies: Ct Abdomen Pelvis Wo Contrast  Result Date: 04/02/2018 CLINICAL DATA:  Dyspnea on exertion, abdominal distention. EXAM: CT CHEST, ABDOMEN AND PELVIS WITHOUT CONTRAST TECHNIQUE: Multidetector CT imaging of the chest, abdomen and pelvis was performed following the standard protocol without IV contrast. COMPARISON:  None. FINDINGS: CT CHEST FINDINGS Cardiovascular: Atherosclerosis of thoracic aorta is noted without aneurysm formation. Normal cardiac size. No pericardial effusion. Mediastinum/Nodes: No enlarged mediastinal, hilar, or axillary lymph nodes. Thyroid gland, trachea, and esophagus demonstrate no significant findings. Lungs/Pleura: No pneumothorax or pleural effusion is noted. Emphysematous disease is noted in the upper lobes bilaterally. 7 mm subpleural nodule is noted in right lower lobe best seen on image number 99 of series 4. No acute consolidative process is noted. Musculoskeletal: No chest wall mass or suspicious bone lesions identified. CT ABDOMEN PELVIS FINDINGS Hepatobiliary: No focal liver abnormality is seen. No gallstones, gallbladder wall thickening, or biliary dilatation. Pancreas: Unremarkable. No pancreatic ductal dilatation or surrounding inflammatory changes. Spleen: Normal in size without focal abnormality. Adrenals/Urinary Tract: Adrenal glands appear normal. Small nonobstructive left renal calculus is noted. No hydronephrosis  or renal obstruction is noted. No ureteral calculi is noted. Urinary bladder is unremarkable. Stomach/Bowel: Stomach is within normal limits. Appendix appears normal. No evidence of bowel wall thickening, distention, or inflammatory changes. Vascular/Lymphatic: Aortic atherosclerosis. No enlarged abdominal or pelvic lymph nodes. Reproductive: Mild prostatic enlargement is noted. Other: No abdominal wall hernia or abnormality. No abdominopelvic ascites. Musculoskeletal: No acute or significant osseous findings. IMPRESSION: 7 mm subpleural nodule seen in right lower lobe. Non-contrast chest CT at 6-12 months is recommended. If the nodule is stable at time of repeat CT, then future CT at 18-24 months (from today's scan) is considered optional for low-risk patients, but is recommended for high-risk patients. This recommendation follows the consensus statement: Guidelines for Management of Incidental Pulmonary Nodules Detected on CT Images: From the Fleischner Society 2017; Radiology 2017; 284:228-243. Small nonobstructive left renal calculus. No hydronephrosis or renal obstruction is noted. Mild prostatic enlargement. Aortic Atherosclerosis (ICD10-I70.0) and Emphysema (ICD10-J43.9). Electronically Signed   By: Marijo Conception, M.D.   On: 04/02/2018 21:34   Ct Chest Wo Contrast  Result Date: 04/02/2018 CLINICAL DATA:  Dyspnea on exertion, abdominal distention. EXAM: CT CHEST, ABDOMEN AND PELVIS WITHOUT CONTRAST TECHNIQUE: Multidetector CT imaging of the chest, abdomen and pelvis was performed following the standard protocol without IV contrast. COMPARISON:  None. FINDINGS: CT CHEST FINDINGS Cardiovascular: Atherosclerosis of thoracic aorta is noted without aneurysm formation. Normal cardiac size. No pericardial effusion. Mediastinum/Nodes: No enlarged mediastinal, hilar, or axillary lymph nodes. Thyroid gland, trachea, and esophagus demonstrate no significant findings. Lungs/Pleura: No pneumothorax or pleural  effusion is noted. Emphysematous disease is noted in the upper lobes bilaterally. 7 mm subpleural nodule is noted in right lower lobe best seen on image number 99 of series 4. No acute consolidative process is noted. Musculoskeletal: No chest wall mass or suspicious bone lesions identified. CT ABDOMEN PELVIS FINDINGS Hepatobiliary: No focal liver abnormality is seen. No gallstones, gallbladder wall thickening, or biliary dilatation. Pancreas: Unremarkable. No pancreatic ductal dilatation or surrounding inflammatory changes. Spleen: Normal in size without focal abnormality. Adrenals/Urinary Tract: Adrenal glands appear normal. Small nonobstructive left renal calculus is noted. No hydronephrosis or renal obstruction is noted. No ureteral  calculi is noted. Urinary bladder is unremarkable. Stomach/Bowel: Stomach is within normal limits. Appendix appears normal. No evidence of bowel wall thickening, distention, or inflammatory changes. Vascular/Lymphatic: Aortic atherosclerosis. No enlarged abdominal or pelvic lymph nodes. Reproductive: Mild prostatic enlargement is noted. Other: No abdominal wall hernia or abnormality. No abdominopelvic ascites. Musculoskeletal: No acute or significant osseous findings. IMPRESSION: 7 mm subpleural nodule seen in right lower lobe. Non-contrast chest CT at 6-12 months is recommended. If the nodule is stable at time of repeat CT, then future CT at 18-24 months (from today's scan) is considered optional for low-risk patients, but is recommended for high-risk patients. This recommendation follows the consensus statement: Guidelines for Management of Incidental Pulmonary Nodules Detected on CT Images: From the Fleischner Society 2017; Radiology 2017; 284:228-243. Small nonobstructive left renal calculus. No hydronephrosis or renal obstruction is noted. Mild prostatic enlargement. Aortic Atherosclerosis (ICD10-I70.0) and Emphysema (ICD10-J43.9). Electronically Signed   By: Marijo Conception,  M.D.   On: 04/02/2018 21:34   Dg Chest Port 1 View  Result Date: 04/02/2018 CLINICAL DATA:  Weakness EXAM: PORTABLE CHEST 1 VIEW COMPARISON:  November 04, 2017 FINDINGS: Lungs are clear. Heart is borderline prominent with pulmonary vascularity normal. No adenopathy. There is aortic atherosclerosis. No evident bone lesions. IMPRESSION: Heart borderline prominent. No edema or consolidation. There is aortic atherosclerosis. Aortic Atherosclerosis (ICD10-I70.0). Electronically Signed   By: Lowella Grip III M.D.   On: 04/02/2018 14:36        Scheduled Meds: . sodium chloride   Intravenous Once  . fentaNYL      . insulin aspart  0-9 Units Subcutaneous TID WC  . midazolam       Continuous Infusions: . sodium chloride    . pantoprozole (PROTONIX) infusion Stopped (04/04/18 1015)     LOS: 2 days    Time spent: 35 minutes.     Hosie Poisson, MD [facility] Pager [phone]   If 7PM-7AM, please contact night-coverage [email or web address] Password [facility] 04/04/2018, 11:49 AM

## 2018-04-04 NOTE — H&P (Signed)
Chief Complaint: Profound Anemia  Referring Physician(s): Ladell Pier  Supervising Physician: Aletta Edouard  Patient Status: [facility] - In-pt  History of Present Illness: Andrew [name] is a 67 y.o. male history of diabetes, COPD, hypertension who presented to the ED with shortness of breath, and dizziness.  He was found to be profoundly anemic with a hemoglobin of 3.6 on admission.  We are asked to perform a bone marrow biopsy for evaluation.  He is NPO.  Past Medical History:  Diagnosis Date  . Abdominal bloating    probable lactose intolorence  . Anemia 04/03/2018  . Asthma   . COPD (chronic obstructive pulmonary disease) (HCC)    mild improvement  . DM (diabetes mellitus) ([place])   . Hearing impairment    asymptomatic  . HTN (hypertension)   . Tobacco use   . Vitamin D deficiency     Past Surgical History:  Procedure Laterality Date  . BIOPSY  04/03/2018   Procedure: BIOPSY;  Surgeon: Otis Brace, MD;  Location: [facility];  Service: Gastroenterology;;  . ESOPHAGOGASTRODUODENOSCOPY (EGD) WITH PROPOFOL N/A 04/03/2018   Procedure: ESOPHAGOGASTRODUODENOSCOPY (EGD) WITH PROPOFOL;  Surgeon: Otis Brace, MD;  Location: [place];  Service: Gastroenterology;  Laterality: N/A;  . EYE SURGERY      Allergies: Shellfish allergy  Medications: Prior to Admission medications   Medication Sig Start Date End Date Taking? Authorizing Provider  amLODipine (NORVASC) 10 MG tablet Take 10 mg by mouth daily.   Yes [provider]  aspirin 81 MG tablet Take 81 mg by mouth daily.   Yes [provider]  benazepril (LOTENSIN) 40 MG tablet Take 40 mg by mouth daily. 03/11/18  Yes [provider]  cholecalciferol (VITAMIN D) 1000 UNITS tablet Take 1,000 Units by mouth daily.   Yes [provider]  dorzolamide-timolol (COSOPT) 22.3-6.8 MG/ML ophthalmic solution Place 1 drop into both eyes 2 (two) times daily. 02/22/18  Yes [provider]  gemfibrozil (LOPID) 600 MG tablet Take 600 mg by mouth 2 (two) times daily. 03/11/18  Yes [provider]  metoprolol (LOPRESSOR) 50 MG tablet Take 25 mg by mouth daily.    Yes [provider]  tetrahydrozoline-zinc (VISINE-AC) 0.05-0.25 % ophthalmic solution 2 drops as needed.   Yes [provider]  TRAVATAN Z 0.004 % SOLN ophthalmic solution Place 1 drop into both eyes every evening. 02/22/18  Yes [provider]  triamterene-hydrochlorothiazide (MAXZIDE-25) 37.5-25 MG per tablet Take 1 tablet by mouth daily.   Yes [provider]  VENTOLIN HFA 108 (90 Base) MCG/ACT inhaler Inhale 1-2 puffs into the lungs 4 (four) times daily as needed. 02/15/18  Yes [provider]     History reviewed. No pertinent family history.  Social History   Socioeconomic History  . Marital status: Single    Spouse name: Not on file  . Number of children: Not on file  . Years of education: Not on file  . Highest education level: Not on file  Occupational History  . Not on file  Social Needs  . Financial resource strain: Not on file  . Food insecurity:    Worry: Not on file    Inability: Not on file  . Transportation needs:    Medical: Not on file    Non-medical: Not on file  Tobacco Use  . Smoking status: Former Smoker    Packs/day: 0.25    Types: Cigarettes    Last attempt to quit: 03/02/2018  Years since quitting: 0.0  . Smokeless tobacco: Never Used  Substance and Sexual Activity  . Alcohol use: Yes    Frequency: Never    Comment: occ  . Drug use: Never  . Sexual activity: Not on file  Lifestyle  . Physical activity:    Days per week: Not on file    Minutes per session: Not on file  . Stress: Not on file  Relationships  . Social connections:    Talks on phone: Not on file    Gets together: Not on file    Attends religious service: Not on file    Active member of club or organization: Not on file    Attends meetings of  clubs or organizations: Not on file    Relationship status: Not on file  Other Topics Concern  . Not on file  Social History Narrative  . Not on file     Review of Systems: A 12 point ROS discussed and pertinent positives are indicated in the HPI above.  All other systems are negative.  Review of Systems  Vital Signs: BP (!) 113/49 (BP Location: Left Arm)   Pulse 72   Temp 99.4 F (37.4 C) (Oral)   Resp (!) 21   Ht '5\' 7"'$  (1.702 m)   Wt 180 lb 5.4 oz (81.8 kg)   SpO2 97%   BMI 28.24 kg/m   Physical Exam  Constitutional: He is oriented to person, place, and time. He appears well-developed.  HENT:  Head: Normocephalic and atraumatic.  Eyes: EOM are normal.  Neck: Normal range of motion.  Cardiovascular: Normal rate, regular rhythm and normal heart sounds.  Pulmonary/Chest: Effort normal.  Abdominal: Soft.  Musculoskeletal: Normal range of motion.  Neurological: He is alert and oriented to person, place, and time.  Skin: Skin is warm and dry.  Psychiatric: He has a normal mood and affect. His behavior is normal. Judgment and thought content normal.  Vitals reviewed.   Imaging: Ct Abdomen Pelvis Wo Contrast  Result Date: 04/02/2018 CLINICAL DATA:  Dyspnea on exertion, abdominal distention. EXAM: CT CHEST, ABDOMEN AND PELVIS WITHOUT CONTRAST TECHNIQUE: Multidetector CT imaging of the chest, abdomen and pelvis was performed following the standard protocol without IV contrast. COMPARISON:  None. FINDINGS: CT CHEST FINDINGS Cardiovascular: Atherosclerosis of thoracic aorta is noted without aneurysm formation. Normal cardiac size. No pericardial effusion. Mediastinum/Nodes: No enlarged mediastinal, hilar, or axillary lymph nodes. Thyroid gland, trachea, and esophagus demonstrate no significant findings. Lungs/Pleura: No pneumothorax or pleural effusion is noted. Emphysematous disease is noted in the upper lobes bilaterally. 7 mm subpleural nodule is noted in right lower lobe best  seen on image number 99 of series 4. No acute consolidative process is noted. Musculoskeletal: No chest wall mass or suspicious bone lesions identified. CT ABDOMEN PELVIS FINDINGS Hepatobiliary: No focal liver abnormality is seen. No gallstones, gallbladder wall thickening, or biliary dilatation. Pancreas: Unremarkable. No pancreatic ductal dilatation or surrounding inflammatory changes. Spleen: Normal in size without focal abnormality. Adrenals/Urinary Tract: Adrenal glands appear normal. Small nonobstructive left renal calculus is noted. No hydronephrosis or renal obstruction is noted. No ureteral calculi is noted. Urinary bladder is unremarkable. Stomach/Bowel: Stomach is within normal limits. Appendix appears normal. No evidence of bowel wall thickening, distention, or inflammatory changes. Vascular/Lymphatic: Aortic atherosclerosis. No enlarged abdominal or pelvic lymph nodes. Reproductive: Mild prostatic enlargement is noted. Other: No abdominal wall hernia or abnormality. No abdominopelvic ascites. Musculoskeletal: No acute or significant osseous findings. IMPRESSION: 7 mm subpleural nodule seen  in right lower lobe. Non-contrast chest CT at 6-12 months is recommended. If the nodule is stable at time of repeat CT, then future CT at 18-24 months (from today's scan) is considered optional for low-risk patients, but is recommended for high-risk patients. This recommendation follows the consensus statement: Guidelines for Management of Incidental Pulmonary Nodules Detected on CT Images: From the Fleischner Society 2017; Radiology 2017; 284:228-243. Small nonobstructive left renal calculus. No hydronephrosis or renal obstruction is noted. Mild prostatic enlargement. Aortic Atherosclerosis (ICD10-I70.0) and Emphysema (ICD10-J43.9). Electronically Signed   By: Marijo Conception, M.D.   On: 04/02/2018 21:34   Ct Chest Wo Contrast  Result Date: 04/02/2018 CLINICAL DATA:  Dyspnea on exertion, abdominal distention.  EXAM: CT CHEST, ABDOMEN AND PELVIS WITHOUT CONTRAST TECHNIQUE: Multidetector CT imaging of the chest, abdomen and pelvis was performed following the standard protocol without IV contrast. COMPARISON:  None. FINDINGS: CT CHEST FINDINGS Cardiovascular: Atherosclerosis of thoracic aorta is noted without aneurysm formation. Normal cardiac size. No pericardial effusion. Mediastinum/Nodes: No enlarged mediastinal, hilar, or axillary lymph nodes. Thyroid gland, trachea, and esophagus demonstrate no significant findings. Lungs/Pleura: No pneumothorax or pleural effusion is noted. Emphysematous disease is noted in the upper lobes bilaterally. 7 mm subpleural nodule is noted in right lower lobe best seen on image number 99 of series 4. No acute consolidative process is noted. Musculoskeletal: No chest wall mass or suspicious bone lesions identified. CT ABDOMEN PELVIS FINDINGS Hepatobiliary: No focal liver abnormality is seen. No gallstones, gallbladder wall thickening, or biliary dilatation. Pancreas: Unremarkable. No pancreatic ductal dilatation or surrounding inflammatory changes. Spleen: Normal in size without focal abnormality. Adrenals/Urinary Tract: Adrenal glands appear normal. Small nonobstructive left renal calculus is noted. No hydronephrosis or renal obstruction is noted. No ureteral calculi is noted. Urinary bladder is unremarkable. Stomach/Bowel: Stomach is within normal limits. Appendix appears normal. No evidence of bowel wall thickening, distention, or inflammatory changes. Vascular/Lymphatic: Aortic atherosclerosis. No enlarged abdominal or pelvic lymph nodes. Reproductive: Mild prostatic enlargement is noted. Other: No abdominal wall hernia or abnormality. No abdominopelvic ascites. Musculoskeletal: No acute or significant osseous findings. IMPRESSION: 7 mm subpleural nodule seen in right lower lobe. Non-contrast chest CT at 6-12 months is recommended. If the nodule is stable at time of repeat CT, then  future CT at 18-24 months (from today's scan) is considered optional for low-risk patients, but is recommended for high-risk patients. This recommendation follows the consensus statement: Guidelines for Management of Incidental Pulmonary Nodules Detected on CT Images: From the Fleischner Society 2017; Radiology 2017; 284:228-243. Small nonobstructive left renal calculus. No hydronephrosis or renal obstruction is noted. Mild prostatic enlargement. Aortic Atherosclerosis (ICD10-I70.0) and Emphysema (ICD10-J43.9). Electronically Signed   By: Marijo Conception, M.D.   On: 04/02/2018 21:34   Dg Chest Port 1 View  Result Date: 04/02/2018 CLINICAL DATA:  Weakness EXAM: PORTABLE CHEST 1 VIEW COMPARISON:  November 04, 2017 FINDINGS: Lungs are clear. Heart is borderline prominent with pulmonary vascularity normal. No adenopathy. There is aortic atherosclerosis. No evident bone lesions. IMPRESSION: Heart borderline prominent. No edema or consolidation. There is aortic atherosclerosis. Aortic Atherosclerosis (ICD10-I70.0). Electronically Signed   By: Lowella Grip III M.D.   On: 04/02/2018 14:36    Labs:  CBC: Recent Labs    04/03/18 0802 04/03/18 1506 04/04/18 0027 04/04/18 0610  WBC 2.4* 2.5* 2.9* 2.4*  HGB 7.8* 7.8* 7.2* 7.2*  HCT 23.9* 23.8* 22.2* 22.0*  PLT 83* 86* 80* 80*    COAGS: Recent Labs  04/03/18 0802  INR 1.08    BMP: Recent Labs    04/02/18 1413 04/02/18 1459 04/03/18 0802 04/04/18 0610  NA 136 136 140 140  K 5.5* 5.6* 4.7 4.3  CL 106 107 108 105  CO2 21* '23 23 26  '$ GLUCOSE 118* 112* 127* 93  BUN 39* 40* 22 14  CALCIUM 9.0 8.9 9.1 9.0  CREATININE 2.08* 2.31* 1.26* 1.20  GFRNONAA 32* 28* 59* >60  GFRAA 37* 33* >60 >60    LIVER FUNCTION TESTS: Recent Labs    04/02/18 1459  BILITOT 0.4  AST 11*  ALT 11  ALKPHOS 68  PROT 7.4  ALBUMIN 3.7    TUMOR MARKERS: No results for input(s): AFPTM, CEA, CA199, CHROMGRNA in the last 8760 hours.  Assessment and  Plan:  Profound Anemia  Will proceed with bone marrow biopsy, hopefully today if schedule allows.  Risks and benefits discussed with the patient including, but not limited to bleeding, infection, damage to adjacent structures or low yield requiring additional tests.  All of the patient's questions were answered, patient is agreeable to proceed. Consent signed and in chart.  Thank you for this interesting consult.  I greatly enjoyed meeting Andrew [name] and look forward to participating in their care.  A copy of this report was sent to the requesting provider on this date.  Electronically Signed: Murrell Redden, PA-C   04/04/2018, 9:04 AM      I spent a total of 20 Minutes in face to face in clinical consultation, greater than 50% of which was counseling/coordinating care for bone marrow biopsy.

## 2018-04-04 NOTE — Progress Notes (Signed)
[facility] Progress Note  Andrew [name] 67 y.o. [birth year]  CC:  Symptomatic anemia, occult blood positive stool, pancytopenia   Subjective: no further bleeding episodes. Hemoglobin stable. Awaiting colonoscopy today   Objective: Vital signs in last 24 hours: Vitals:   04/04/18 0419 04/04/18 0531  BP: (!) 113/49   Pulse: 72   Resp: (!) 21   Temp:  99.4 F (37.4 C)  SpO2: 97%     Physical Exam:  Gen. Alert/oriented 3. Not in acute distress. Heart/rate and rhythm regular. Abdomen. Soft, nontender, nondistended, bowel sounds present. Lower extremity showed no edema.  Lab Results: Recent Labs    04/02/18 1854 04/03/18 0802 04/04/18 0610  NA  --  140 140  K  --  4.7 4.3  CL  --  108 105  CO2  --  23 26  GLUCOSE  --  127* 93  BUN  --  22 14  CREATININE  --  1.26* 1.20  CALCIUM  --  9.1 9.0  MG 3.0*  --   --    Recent Labs    04/02/18 1459  AST 11*  ALT 11  ALKPHOS 68  BILITOT 0.4  PROT 7.4  ALBUMIN 3.7   Recent Labs    04/02/18 1459  04/04/18 0027 04/04/18 0610  WBC 2.5*   < > 2.9* 2.4*  NEUTROABS 1.1*  --   --   --   HGB 3.6*   < > 7.2* 7.2*  HCT 11.4*   < > 22.2* 22.0*  MCV 131.0*   < > 100.9* 101.9*  PLT 102*   < > 80* 80*   < > = values in this interval not displayed.   Recent Labs    04/03/18 0802  LABPROT 13.9  INR 1.08      Assessment/Plan: - symptomatic anemia with hemoglobin of 3.8 on admission.occult blood positive stool. EGD 07/11 - showed gastritis and duodenitis. Biopsies pending. - 1 episode of melena 2 days ago. No evidence of active bleeding on EGD yesterday. - pancytopenia - most likely Primary bone marrow abnormality. Currently followed by hematology. Plan for bone marrow biopsy. - History of COPD  Recommendations ------------------------- - colonoscopy today given significant anemia with hemoglobin 3.8 , occult blood positive stool and EGD negative for active bleeding. - Further workup for pancytopenia  per primary team/hematology.  Risks (bleeding, infection, bowel perforation that could require surgery, sedation-related changes in cardiopulmonary systems), benefits (identification and possible treatment of source of symptoms, exclusion of certain causes of symptoms), and alternatives (watchful waiting, radiographic imaging studies, empiric medical treatment)  were explained to patient and family in detail and patient wishes to proceed.   Otis Brace MD, [place] 04/04/2018, 10:07 AM  Contact #  [phone]

## 2018-04-04 NOTE — Procedures (Signed)
Interventional Radiology Procedure Note  Procedure: CT guided aspirate and core biopsy of right posterior iliac bone Complications: None Recommendations: - Bedrest supine x 1 hrs - OTC's PRN  Pain - Follow biopsy results  Signed,  Camila Norville S. Benjimin Hadden, DO    

## 2018-04-04 NOTE — Anesthesia Preprocedure Evaluation (Signed)
Anesthesia Evaluation  Patient identified by MRN, date of birth, ID band Patient awake    Reviewed: Allergy & Precautions, NPO status , Patient's Chart, lab work & pertinent test results  Airway Mallampati: II  TM Distance: >3 FB Neck ROM: Full    Dental  (+) Poor Dentition, Dental Advisory Given, Missing   Pulmonary asthma , former smoker,    breath sounds clear to auscultation       Cardiovascular hypertension,  Rhythm:Regular Rate:Normal     Neuro/Psych    GI/Hepatic negative GI ROS, Neg liver ROS,   Endo/Other  diabetes  Renal/GU negative Renal ROS     Musculoskeletal   Abdominal   Peds  Hematology  (+) anemia ,   Anesthesia Other Findings   Reproductive/Obstetrics                             Anesthesia Physical Anesthesia Plan  ASA: III  Anesthesia Plan: MAC   Post-op Pain Management:    Induction: Intravenous  PONV Risk Score and Plan: 2 and Treatment may vary due to age or medical condition  Airway Management Planned: Natural Airway and Simple Face Mask  Additional Equipment:   Intra-op Plan:   Post-operative Plan:   Informed Consent: I have reviewed the patients History and Physical, chart, labs and discussed the procedure including the risks, benefits and alternatives for the proposed anesthesia with the patient or authorized representative who has indicated his/her understanding and acceptance.   Dental advisory given  Plan Discussed with: CRNA  Anesthesia Plan Comments:         Anesthesia Quick Evaluation

## 2018-04-06 ENCOUNTER — Encounter (HOSPITAL_COMMUNITY): Payer: Self-pay | Admitting: Gastroenterology

## 2018-04-06 LAB — BPAM RBC
BLOOD PRODUCT EXPIRATION DATE: 201908102359
BLOOD PRODUCT EXPIRATION DATE: 201908102359
BLOOD PRODUCT EXPIRATION DATE: 201908102359
Blood Product Expiration Date: 201908102359
Blood Product Expiration Date: 201908102359
Blood Product Expiration Date: 201908102359
ISSUE DATE / TIME: 201907101942
ISSUE DATE / TIME: 201907102204
ISSUE DATE / TIME: 201907110033
ISSUE DATE / TIME: 201907110407
ISSUE DATE / TIME: 201907121002
UNIT TYPE AND RH: 5100
UNIT TYPE AND RH: 5100
UNIT TYPE AND RH: 5100
Unit Type and Rh: 5100
Unit Type and Rh: 5100
Unit Type and Rh: 5100

## 2018-04-06 LAB — TYPE AND SCREEN
ABO/RH(D): O POS
ANTIBODY SCREEN: NEGATIVE
UNIT DIVISION: 0
UNIT DIVISION: 0
UNIT DIVISION: 0
Unit division: 0
Unit division: 0
Unit division: 0

## 2018-04-07 ENCOUNTER — Telehealth: Payer: Self-pay | Admitting: Oncology

## 2018-04-07 NOTE — Telephone Encounter (Signed)
Pt has been scheduled to see Dr. [name] for a hospital follow up on 7/18 at 1030am with labs at [place]. Pt has been notified of these appointments.

## 2018-04-07 NOTE — Anesthesia Postprocedure Evaluation (Signed)
Anesthesia Post Note  Patient: Andrew [name]  Procedure(s) Performed: COLONOSCOPY WITH PROPOFOL (N/A )     Patient location during evaluation: PACU Anesthesia Type: MAC Level of consciousness: awake and alert Pain management: pain level controlled Vital Signs Assessment: post-procedure vital signs reviewed and stable Respiratory status: spontaneous breathing, nonlabored ventilation, respiratory function stable and patient connected to nasal cannula oxygen Cardiovascular status: stable and blood pressure returned to baseline Postop Assessment: no apparent nausea or vomiting Anesthetic complications: no    Last Vitals:  Vitals:   04/04/18 1500 04/04/18 1510  BP: (!) 144/54 138/63  Pulse: 79 78  Resp: 19 (!) 27  Temp:    SpO2: 99% 100%    Last Pain:  Vitals:   04/04/18 1510  TempSrc:   PainSc: 0-No pain                 Milana Salay S

## 2018-04-09 NOTE — Discharge Summary (Addendum)
Physician Discharge Summary  Andrew [name] UXN:[id number] DOB: June 01, 1951 DOA: 04/02/2018  PCP: Andrew Blocker, MD  Admit date: 04/02/2018 Discharge date: 04/04/2018  Admitted From: Home.  Disposition: Home.   Recommendations for Outpatient Follow-up:  1. Follow up with PCP in 1-2 weeks 2. Please obtain BMP/CBC in one week Please follow up with Dr Andrew [name] as recommended on 7/18.  Follow up with PCP regarding the pulmonary nodule in the right lower lobe of the lung with a CT chest in 6 months.    Discharge Condition: stable.  CODE STATUS: full code.  Diet recommendation: Heart Healthy Brief/Interim Summary: 67 year old male with a history of diabetes, COPD, hypertension presents with shortness of breath, dizziness for a few months now. On arrival to ED he was found to be hypotensive and tachycardic. Lab work work revealed severe profound anemia with pancytopenia. He underwent 4 units of prbc transfusion and repeat h&H is around 7.  Meanwhile GI and Hematology consulted for severe anemia, with guaiac positive stools and pancytopenia.     Discharge Diagnoses:  Principal Problem:   Pancytopenia ([place]) Active Problems:   HTN (hypertension)   COPD (chronic obstructive pulmonary disease) (HCC)   DM (diabetes mellitus) (HCC)   Allergic rhinitis  Pancytopenia:  LDH wnl.  Wbc count is around 2.4.  Smear available .  CT guided Bone marrow Biopsy done, recommend to follow up the results with Dr Andrew [name] as outpatient.     Severe anemia/ anemia of chronic blood loss:    Anemia of chronic disease. Anemia panel reviewed.  guaiac positive stools.  melena few days ago.  He was started on PPI IV, GI consulted for possible EGD and colonoscopy. He underwent EGD showing chronic gastritis and duodenitis. Biopsies were taken.  colonoscopy shows diverticulosis. He was cleared by GI and no other follow up needed.he was recommended to follow up the biopsy results.  Transfuse to keep hemoglobin  greater than 7. A total of 5 units of prbc transfusions were given. His H&H on discharge is  Around 8.    Acute renal failure with hyperkalemia  Suspect pre renal. CT abd does not show any hydronephrosis.  Resolved with hydratin.     Hypotension  Resolved with IV fluids.    COPD:  No wheezing heard.  Resume Duo Neb treatments    Hyperkalemia : Resolved with hydration.   Abnormal free t4, but TSH is wnl.    Diabetes Mellitus:  CBG (last 3)  RecentLabs(last2labs)  Recent Labs    04/03/18 1633 04/03/18 2131 04/04/18 0936  GLUCAP 102* 91 86     A1c is 7.3, probably false in view of the severe anemia. Recommend checking in 6 weeks.  Resume SSI for now. No changes.   Intentional weight loss: CT chest and abdomen , pelvis shows 7 mm subpleural nodule seen in right lower lobe. Non-contrast chest CT at 6-12 months is recommended. If the nodule is stable at time of repeat CT, then future CT at 18-24 months (from today's scan) is considered optional for low-risk patients, but is recommended for high-risk patients      Discharge Instructions  Discharge Instructions    Diet - low sodium heart healthy   Complete by:  As directed    Discharge instructions   Complete by:  As directed    Please follow up with PCP in one week.  Please follow up with Dr Andrew [name] as recommended on Wednesday. We have held your aspirin, please hold for 1 week and discuss  with PCP before resuming it.     Allergies as of 04/04/2018      Reactions   Shellfish Allergy    " I BREAK OUT IN HIVES "      Medication List    STOP taking these medications   aspirin 81 MG tablet     TAKE these medications   amLODipine 10 MG tablet Commonly known as:  NORVASC Take 10 mg by mouth daily.   benazepril 40 MG tablet Commonly known as:  LOTENSIN Take 40 mg by mouth daily.   cholecalciferol 1000 units tablet Commonly known as:  VITAMIN D Take 1,000 Units by mouth daily.    dorzolamide-timolol 22.3-6.8 MG/ML ophthalmic solution Commonly known as:  COSOPT Place 1 drop into both eyes 2 (two) times daily.   gemfibrozil 600 MG tablet Commonly known as:  LOPID Take 600 mg by mouth 2 (two) times daily.   metoprolol tartrate 50 MG tablet Commonly known as:  LOPRESSOR Take 25 mg by mouth daily.   pantoprazole 40 MG tablet Commonly known as:  PROTONIX Take 1 tablet (40 mg total) by mouth 2 (two) times daily.   tetrahydrozoline-zinc 0.05-0.25 % ophthalmic solution Commonly known as:  VISINE-AC 2 drops as needed.   TRAVATAN Z 0.004 % Soln ophthalmic solution Generic drug:  Travoprost (BAK Free) Place 1 drop into both eyes every evening.   triamterene-hydrochlorothiazide 37.5-25 MG tablet Commonly known as:  MAXZIDE-25 Take 1 tablet by mouth daily.   VENTOLIN HFA 108 (90 Base) MCG/ACT inhaler Generic drug:  albuterol Inhale 1-2 puffs into the lungs 4 (four) times daily as needed.      Follow-up Information    Andrew Blocker, MD. Schedule an appointment as soon as possible for a visit in 1 week(s).   Specialty:  Internal Medicine Contact information: [address] [place] [address] [phone]        Ladell Pier, MD Follow up on 04/09/2018.   Specialty:  Oncology Contact information: [place] [address] [phone]          Allergies  Allergen Reactions  . Shellfish Allergy     " I BREAK OUT IN HIVES "    Consultations:  Gastroenterology   Hematology.    Procedures/Studies: Ct Abdomen Pelvis Wo Contrast  Result Date: 04/02/2018 CLINICAL DATA:  Dyspnea on exertion, abdominal distention. EXAM: CT CHEST, ABDOMEN AND PELVIS WITHOUT CONTRAST TECHNIQUE: Multidetector CT imaging of the chest, abdomen and pelvis was performed following the standard protocol without IV contrast. COMPARISON:  None. FINDINGS: CT CHEST FINDINGS Cardiovascular: Atherosclerosis of thoracic aorta is noted  without aneurysm formation. Normal cardiac size. No pericardial effusion. Mediastinum/Nodes: No enlarged mediastinal, hilar, or axillary lymph nodes. Thyroid gland, trachea, and esophagus demonstrate no significant findings. Lungs/Pleura: No pneumothorax or pleural effusion is noted. Emphysematous disease is noted in the upper lobes bilaterally. 7 mm subpleural nodule is noted in right lower lobe best seen on image number 99 of series 4. No acute consolidative process is noted. Musculoskeletal: No chest wall mass or suspicious bone lesions identified. CT ABDOMEN PELVIS FINDINGS Hepatobiliary: No focal liver abnormality is seen. No gallstones, gallbladder wall thickening, or biliary dilatation. Pancreas: Unremarkable. No pancreatic ductal dilatation or surrounding inflammatory changes. Spleen: Normal in size without focal abnormality. Adrenals/Urinary Tract: Adrenal glands appear normal. Small nonobstructive left renal calculus is noted. No hydronephrosis or renal obstruction is noted. No ureteral calculi is noted. Urinary bladder is unremarkable. Stomach/Bowel: Stomach is within normal  limits. Appendix appears normal. No evidence of bowel wall thickening, distention, or inflammatory changes. Vascular/Lymphatic: Aortic atherosclerosis. No enlarged abdominal or pelvic lymph nodes. Reproductive: Mild prostatic enlargement is noted. Other: No abdominal wall hernia or abnormality. No abdominopelvic ascites. Musculoskeletal: No acute or significant osseous findings. IMPRESSION: 7 mm subpleural nodule seen in right lower lobe. Non-contrast chest CT at 6-12 months is recommended. If the nodule is stable at time of repeat CT, then future CT at 18-24 months (from today's scan) is considered optional for low-risk patients, but is recommended for high-risk patients. This recommendation follows the consensus statement: Guidelines for Management of Incidental Pulmonary Nodules Detected on CT Images: From the Fleischner Society  2017; Radiology 2017; 284:228-243. Small nonobstructive left renal calculus. No hydronephrosis or renal obstruction is noted. Mild prostatic enlargement. Aortic Atherosclerosis (ICD10-I70.0) and Emphysema (ICD10-J43.9). Electronically Signed   By: Marijo Conception, M.D.   On: 04/02/2018 21:34   Ct Chest Wo Contrast  Result Date: 04/02/2018 CLINICAL DATA:  Dyspnea on exertion, abdominal distention. EXAM: CT CHEST, ABDOMEN AND PELVIS WITHOUT CONTRAST TECHNIQUE: Multidetector CT imaging of the chest, abdomen and pelvis was performed following the standard protocol without IV contrast. COMPARISON:  None. FINDINGS: CT CHEST FINDINGS Cardiovascular: Atherosclerosis of thoracic aorta is noted without aneurysm formation. Normal cardiac size. No pericardial effusion. Mediastinum/Nodes: No enlarged mediastinal, hilar, or axillary lymph nodes. Thyroid gland, trachea, and esophagus demonstrate no significant findings. Lungs/Pleura: No pneumothorax or pleural effusion is noted. Emphysematous disease is noted in the upper lobes bilaterally. 7 mm subpleural nodule is noted in right lower lobe best seen on image number 99 of series 4. No acute consolidative process is noted. Musculoskeletal: No chest wall mass or suspicious bone lesions identified. CT ABDOMEN PELVIS FINDINGS Hepatobiliary: No focal liver abnormality is seen. No gallstones, gallbladder wall thickening, or biliary dilatation. Pancreas: Unremarkable. No pancreatic ductal dilatation or surrounding inflammatory changes. Spleen: Normal in size without focal abnormality. Adrenals/Urinary Tract: Adrenal glands appear normal. Small nonobstructive left renal calculus is noted. No hydronephrosis or renal obstruction is noted. No ureteral calculi is noted. Urinary bladder is unremarkable. Stomach/Bowel: Stomach is within normal limits. Appendix appears normal. No evidence of bowel wall thickening, distention, or inflammatory changes. Vascular/Lymphatic: Aortic  atherosclerosis. No enlarged abdominal or pelvic lymph nodes. Reproductive: Mild prostatic enlargement is noted. Other: No abdominal wall hernia or abnormality. No abdominopelvic ascites. Musculoskeletal: No acute or significant osseous findings. IMPRESSION: 7 mm subpleural nodule seen in right lower lobe. Non-contrast chest CT at 6-12 months is recommended. If the nodule is stable at time of repeat CT, then future CT at 18-24 months (from today's scan) is considered optional for low-risk patients, but is recommended for high-risk patients. This recommendation follows the consensus statement: Guidelines for Management of Incidental Pulmonary Nodules Detected on CT Images: From the Fleischner Society 2017; Radiology 2017; 284:228-243. Small nonobstructive left renal calculus. No hydronephrosis or renal obstruction is noted. Mild prostatic enlargement. Aortic Atherosclerosis (ICD10-I70.0) and Emphysema (ICD10-J43.9). Electronically Signed   By: Marijo Conception, M.D.   On: 04/02/2018 21:34   Dg Chest Port 1 View  Result Date: 04/02/2018 CLINICAL DATA:  Weakness EXAM: PORTABLE CHEST 1 VIEW COMPARISON:  November 04, 2017 FINDINGS: Lungs are clear. Heart is borderline prominent with pulmonary vascularity normal. No adenopathy. There is aortic atherosclerosis. No evident bone lesions. IMPRESSION: Heart borderline prominent. No edema or consolidation. There is aortic atherosclerosis. Aortic Atherosclerosis (ICD10-I70.0). Electronically Signed   By: Lowella Grip III M.D.   On: 04/02/2018  14:36   Ct Bone Marrow Biopsy  Result Date: 04/04/2018 INDICATION: 67 year old male with a history of anemia EXAM: CT BIOPSY BONE MARROW MEDICATIONS: None. ANESTHESIA/SEDATION: Moderate (conscious) sedation was employed during this procedure. A total of Versed 1.0 mg and Fentanyl 50 mcg was administered intravenously. Moderate Sedation Time: 10 minutes. The patient's level of consciousness and vital signs were monitored  continuously by radiology nursing throughout the procedure under my direct supervision. FLUOROSCOPY TIME:  CT COMPLICATIONS: None PROCEDURE: The procedure risks, benefits, and alternatives were explained to the patient. Questions regarding the procedure were encouraged and answered. The patient understands and consents to the procedure. Scout CT of the pelvis was performed for surgical planning purposes. The posterior pelvis was prepped with chlorhexidinein a sterile fashion, and a sterile drape was applied covering the operative field. A sterile gown and sterile gloves were used for the procedure. Local anesthesia was provided with 1% Lidocaine. We targeted the right posterior iliac bone for biopsy. The skin and subcutaneous tissues were infiltrated with 1% lidocaine without epinephrine. A small stab incision was made with an 11 blade scalpel, and an 11 gauge Murphy needle was advanced with CT guidance to the posterior cortex. Manual forced was used to advance the needle through the posterior cortex and the stylet was removed. A bone marrow aspirate was retrieved and passed to a cytotechnologist in the room. The Murphy needle was then advanced without the stylet for a core biopsy. The core biopsy was retrieved and also passed to a cytotechnologist. Manual pressure was used for hemostasis and a sterile dressing was placed. No complications were encountered no significant blood loss was encountered. Patient tolerated the procedure well and remained hemodynamically stable throughout. IMPRESSION: Status post CT-guided bone marrow biopsy, with tissue specimen sent to pathology for complete histopathologic analysis Signed, Dulcy Fanny. Earleen Newport, DO [facility] Electronically Signed   By: Corrie Mckusick D.O.   On: 04/04/2018 14:32       Subjective: No chest pain or sob, no nausea, vomiting, dizziness or bone pain.   Discharge Exam: Vitals:   04/04/18 1500  04/04/18 1510  BP: (!) 144/54 138/63  Pulse: 79 78  Resp: 19 (!) 27  Temp:    SpO2: 99% 100%   Vitals:   04/04/18 1458 04/04/18 1459 04/04/18 1500 04/04/18 1510  BP: (!) 181/47 (!) 144/54 (!) 144/54 138/63  Pulse: 99 91 79 78  Resp: '20 20 19 '$ (!) 27  Temp:  98.5 F (36.9 C)    TempSrc:  Oral    SpO2:  100% 99% 100%  Weight:      Height:        General: Pt is alert, awake, not in acute distress Cardiovascular: RRR, S1/S2 +, no rubs, no gallops Respiratory: CTA bilaterally, no wheezing, no rhonchi Abdominal: Soft, NT, ND, bowel sounds + Extremities: no edema, no cyanosis    The results of significant diagnostics from this hospitalization (including imaging, microbiology, ancillary and laboratory) are listed below for reference.     Microbiology: Recent Results (from the past 240 hour(s))  MRSA PCR Screening     Status: None   Collection Time: 04/03/18 10:40 PM  Result Value Ref Range Status   MRSA by PCR NEGATIVE NEGATIVE Final    Comment:        The GeneXpert MRSA Assay (FDA approved for NASAL specimens only), is one component of a comprehensive MRSA colonization surveillance program. It is not intended to diagnose MRSA infection nor  to guide or monitor treatment for MRSA infections. Performed at [facility], [place] [address], [place], [place] [address]      Labs: BNP (last 3 results) No results for input(s): BNP in the last 8760 hours. Basic Metabolic Panel: Recent Labs  Lab 04/02/18 1413 04/02/18 1459 04/02/18 1854 04/03/18 0802 04/04/18 0610  NA 136 136  --  140 140  K 5.5* 5.6*  --  4.7 4.3  CL 106 107  --  108 105  CO2 21* 23  --  23 26  GLUCOSE 118* 112*  --  127* 93  BUN 39* 40*  --  22 14  CREATININE 2.08* 2.31*  --  1.26* 1.20  CALCIUM 9.0 8.9  --  9.1 9.0  MG  --   --  3.0*  --   --    Liver Function Tests: Recent Labs  Lab 04/02/18 1459  AST 11*  ALT 11  ALKPHOS 68  BILITOT 0.4  PROT 7.4  ALBUMIN 3.7   No results for  input(s): LIPASE, AMYLASE in the last 168 hours. No results for input(s): AMMONIA in the last 168 hours. CBC: Recent Labs  Lab 04/02/18 1459 04/03/18 0802 04/03/18 1506 04/04/18 0027 04/04/18 0610 04/04/18 1541  WBC 2.5* 2.4* 2.5* 2.9* 2.4*  --   NEUTROABS 1.1*  --   --   --   --   --   HGB 3.6* 7.8* 7.8* 7.2* 7.2* 8.4*  HCT 11.4* 23.9* 23.8* 22.2* 22.0* 26.6*  MCV 131.0* 99.6 101.3* 100.9* 101.9*  --   PLT 102* 83* 86* 80* 80*  --    Cardiac Enzymes: Recent Labs  Lab 04/02/18 1854 04/03/18 0802  TROPONINI <0.03 <0.03   BNP: Invalid input(s): POCBNP CBG: Recent Labs  Lab 04/03/18 1633 04/03/18 2131 04/04/18 0936 04/04/18 1216 04/04/18 1651  GLUCAP 102* 91 86 77 74   D-Dimer No results for input(s): DDIMER in the last 72 hours. Hgb A1c No results for input(s): HGBA1C in the last 72 hours. Lipid Profile No results for input(s): CHOL, HDL, LDLCALC, TRIG, CHOLHDL, LDLDIRECT in the last 72 hours. Thyroid function studies No results for input(s): TSH, T4TOTAL, T3FREE, THYROIDAB in the last 72 hours.  Invalid input(s): FREET3 Anemia work up No results for input(s): VITAMINB12, FOLATE, FERRITIN, TIBC, IRON, RETICCTPCT in the last 72 hours. Urinalysis No results found for: COLORURINE, APPEARANCEUR, [place], [place], [place], [place], [place], [place], PROTEINUR, UROBILINOGEN, NITRITE, LEUKOCYTESUR Sepsis Labs Invalid input(s): PROCALCITONIN,  WBC,  LACTICIDVEN Microbiology Recent Results (from the past 240 hour(s))  MRSA PCR Screening     Status: None   Collection Time: 04/03/18 10:40 PM  Result Value Ref Range Status   MRSA by PCR NEGATIVE NEGATIVE Final    Comment:        The GeneXpert MRSA Assay (FDA approved for NASAL specimens only), is one component of a comprehensive MRSA colonization surveillance program. It is not intended to diagnose MRSA infection nor to guide or monitor treatment for MRSA infections. Performed at [facility],  [place] [address] [place],  [address]      Time coordinating discharge:32  minutes  SIGNED:   Hosie Poisson, MD  [facility] 04/09/2018, 8:30 AM Pager   If 7PM-7AM, please contact night-coverage [email or web address] Password [email or web address]

## 2018-04-10 ENCOUNTER — Telehealth: Payer: Self-pay

## 2018-04-10 ENCOUNTER — Encounter: Payer: Self-pay | Admitting: Oncology

## 2018-04-10 ENCOUNTER — Other Ambulatory Visit: Payer: Self-pay

## 2018-04-10 ENCOUNTER — Inpatient Hospital Stay: Payer: BLUE CROSS/BLUE SHIELD | Attending: Oncology | Admitting: Oncology

## 2018-04-10 ENCOUNTER — Inpatient Hospital Stay: Payer: BLUE CROSS/BLUE SHIELD

## 2018-04-10 VITALS — BP 125/62 | HR 65 | Temp 98.1°F | Resp 17 | Ht 67.0 in | Wt 176.9 lb

## 2018-04-10 DIAGNOSIS — E119 Type 2 diabetes mellitus without complications: Secondary | ICD-10-CM | POA: Insufficient documentation

## 2018-04-10 DIAGNOSIS — D7589 Other specified diseases of blood and blood-forming organs: Secondary | ICD-10-CM | POA: Insufficient documentation

## 2018-04-10 DIAGNOSIS — K921 Melena: Secondary | ICD-10-CM | POA: Insufficient documentation

## 2018-04-10 DIAGNOSIS — D469 Myelodysplastic syndrome, unspecified: Secondary | ICD-10-CM | POA: Insufficient documentation

## 2018-04-10 DIAGNOSIS — D61818 Other pancytopenia: Secondary | ICD-10-CM

## 2018-04-10 DIAGNOSIS — N289 Disorder of kidney and ureter, unspecified: Secondary | ICD-10-CM | POA: Insufficient documentation

## 2018-04-10 DIAGNOSIS — Z79899 Other long term (current) drug therapy: Secondary | ICD-10-CM | POA: Insufficient documentation

## 2018-04-10 DIAGNOSIS — R911 Solitary pulmonary nodule: Secondary | ICD-10-CM | POA: Insufficient documentation

## 2018-04-10 LAB — CBC WITH DIFFERENTIAL (CANCER CENTER ONLY)
BASOS ABS: 0.1 10*3/uL (ref 0.0–0.1)
Basophils Relative: 4 %
Eosinophils Absolute: 0.1 10*3/uL (ref 0.0–0.5)
Eosinophils Relative: 4 %
HEMATOCRIT: 26.4 % — AB (ref 38.4–49.9)
HEMOGLOBIN: 8.6 g/dL — AB (ref 13.0–17.1)
LYMPHS PCT: 41 %
Lymphs Abs: 0.8 10*3/uL — ABNORMAL LOW (ref 0.9–3.3)
MCH: 30.3 pg (ref 27.2–33.4)
MCHC: 32.6 g/dL (ref 32.0–36.0)
MCV: 93 fL (ref 79.3–98.0)
Monocytes Absolute: 0.4 10*3/uL (ref 0.1–0.9)
Monocytes Relative: 18 %
NEUTROS ABS: 0.7 10*3/uL — AB (ref 1.5–6.5)
Neutrophils Relative %: 33 %
Platelet Count: 50 10*3/uL — ABNORMAL LOW (ref 140–400)
RBC: 2.84 MIL/uL — AB (ref 4.20–5.82)
RDW: 22.8 % — ABNORMAL HIGH (ref 11.0–14.6)
WBC: 2 10*3/uL — AB (ref 4.0–10.3)

## 2018-04-10 LAB — SAMPLE TO BLOOD BANK

## 2018-04-10 NOTE — Telephone Encounter (Signed)
Printed avs and calender of upcoming appointment. Per 7/18 los 

## 2018-04-10 NOTE — Progress Notes (Signed)
  [place] OFFICE PROGRESS NOTE   Diagnosis: Pancytopenia  INTERVAL HISTORY:   I saw Andrew [name] when he was hospitalized with severe anemia last week.  He was transfused with multiple units of packed red blood cells.  He reports feeling much better. A bone marrow biopsy 04/04/2018 revealed a hypercellular marrow with dyspoietic changes, mainly involving the granulocytic and megakaryocytic lines.  Blasts were not increased in number.  Cytogenetics and a FISH panel are pending.  Flow cytometry did not show an increased blast population or monoclonal population.  He reports drinking approximately 3 beers each day.  Objective:  Vital signs in last 24 hours:  Blood pressure 125/62, pulse 65, temperature 98.1 F (36.7 C), temperature source Oral, resp. rate 17, height '5\' 7"'$  (8.502 m), weight 176 lb 14.4 oz (80.2 kg), SpO2 100 %.    Resp: Lungs clear bilaterally Cardio: Regular rate and rhythm GI: No hepatosplenomegaly Vascular: No leg edema   Lab Results:  Lab Results  Component Value Date   WBC 2.0 (L) 04/10/2018   HGB 8.6 (L) 04/10/2018   HCT 26.4 (L) 04/10/2018   MCV 93.0 04/10/2018   PLT 50 (L) 04/10/2018   NEUTROABS 0.7 (L) 04/10/2018    CMP  Lab Results  Component Value Date   NA 140 04/04/2018   K 4.3 04/04/2018   CL 105 04/04/2018   CO2 26 04/04/2018   GLUCOSE 93 04/04/2018   BUN 14 04/04/2018   CREATININE 1.20 04/04/2018   CALCIUM 9.0 04/04/2018   PROT 7.4 04/02/2018   ALBUMIN 3.7 04/02/2018   AST 11 (L) 04/02/2018   ALT 11 04/02/2018   ALKPHOS 68 04/02/2018   BILITOT 0.4 04/02/2018   GFRNONAA >60 04/04/2018   GFRAA >60 04/04/2018     Medications: I have reviewed the patient's current medications.   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  I doubt the hematologic findings are related to alcohol use, but this is possible.  He will discontinue alcohol.  He knows to contact us for bleeding or a fever.  He will also contact us for recurrent symptoms of anemia.  He will return for a CBC on 04/16/2018.  The hemoglobin is stable today.  We we will follow-up on results from the bone marrow cytogenetics and FISH panel.  He will return for an office visit on 04/21/2018.  I will refer him to the leukemia service at [facility] for recommendations regarding management of the myelodysplasia.  25 minutes were spent with the patient today.  The majority of the time was used for counseling and coordination of care.  Betsy Coder, MD  04/10/2018  11:10 AM

## 2018-04-16 ENCOUNTER — Inpatient Hospital Stay: Payer: BLUE CROSS/BLUE SHIELD

## 2018-04-16 ENCOUNTER — Encounter (HOSPITAL_COMMUNITY): Payer: Self-pay | Admitting: Oncology

## 2018-04-16 DIAGNOSIS — D61818 Other pancytopenia: Secondary | ICD-10-CM

## 2018-04-16 LAB — CBC WITH DIFFERENTIAL (CANCER CENTER ONLY)
BASOS ABS: 0.1 10*3/uL (ref 0.0–0.1)
BASOS PCT: 3 %
EOS ABS: 0.1 10*3/uL (ref 0.0–0.5)
Eosinophils Relative: 3 %
HEMATOCRIT: 23.6 % — AB (ref 38.4–49.9)
HEMOGLOBIN: 7.9 g/dL — AB (ref 13.0–17.1)
Lymphocytes Relative: 44 %
Lymphs Abs: 0.8 10*3/uL — ABNORMAL LOW (ref 0.9–3.3)
MCH: 30.5 pg (ref 27.2–33.4)
MCHC: 33.5 g/dL (ref 32.0–36.0)
MCV: 91.1 fL (ref 79.3–98.0)
MONOS PCT: 12 %
Monocytes Absolute: 0.2 10*3/uL (ref 0.1–0.9)
NEUTROS ABS: 0.7 10*3/uL — AB (ref 1.5–6.5)
NEUTROS PCT: 38 %
Platelet Count: 52 10*3/uL — ABNORMAL LOW (ref 140–400)
RBC: 2.59 MIL/uL — AB (ref 4.20–5.82)
RDW: 22.2 % — ABNORMAL HIGH (ref 11.0–14.6)
WBC: 1.8 10*3/uL — AB (ref 4.0–10.3)

## 2018-04-16 LAB — SAMPLE TO BLOOD BANK

## 2018-04-17 ENCOUNTER — Telehealth: Payer: Self-pay

## 2018-04-17 ENCOUNTER — Telehealth: Payer: Self-pay | Admitting: Nurse Practitioner

## 2018-04-17 ENCOUNTER — Other Ambulatory Visit: Payer: Self-pay

## 2018-04-17 NOTE — Telephone Encounter (Addendum)
Pt voiced understanding of message below    LVM for pt to return call to clinic    ----- Message from Ladell Pier, MD sent at 04/16/2018  3:19 PM EDT ----- Please call patient, hemoglobin is slightly lower, call for symptoms of anemia, and blood bank sample with office visit next week

## 2018-04-17 NOTE — Telephone Encounter (Signed)
Pt aware of apt for lab add on 7/29

## 2018-04-21 ENCOUNTER — Inpatient Hospital Stay (HOSPITAL_BASED_OUTPATIENT_CLINIC_OR_DEPARTMENT_OTHER): Payer: BLUE CROSS/BLUE SHIELD | Admitting: Nurse Practitioner

## 2018-04-21 ENCOUNTER — Ambulatory Visit: Payer: BLUE CROSS/BLUE SHIELD

## 2018-04-21 ENCOUNTER — Encounter: Payer: Self-pay | Admitting: Nurse Practitioner

## 2018-04-21 ENCOUNTER — Telehealth: Payer: Self-pay | Admitting: Oncology

## 2018-04-21 ENCOUNTER — Inpatient Hospital Stay: Payer: BLUE CROSS/BLUE SHIELD

## 2018-04-21 VITALS — BP 113/52 | HR 72 | Temp 99.1°F | Resp 18 | Ht 67.0 in | Wt 175.7 lb

## 2018-04-21 DIAGNOSIS — D7589 Other specified diseases of blood and blood-forming organs: Secondary | ICD-10-CM

## 2018-04-21 DIAGNOSIS — D469 Myelodysplastic syndrome, unspecified: Secondary | ICD-10-CM

## 2018-04-21 DIAGNOSIS — Z79899 Other long term (current) drug therapy: Secondary | ICD-10-CM

## 2018-04-21 DIAGNOSIS — R911 Solitary pulmonary nodule: Secondary | ICD-10-CM

## 2018-04-21 DIAGNOSIS — D61818 Other pancytopenia: Secondary | ICD-10-CM

## 2018-04-21 DIAGNOSIS — N289 Disorder of kidney and ureter, unspecified: Secondary | ICD-10-CM

## 2018-04-21 DIAGNOSIS — E119 Type 2 diabetes mellitus without complications: Secondary | ICD-10-CM

## 2018-04-21 DIAGNOSIS — K921 Melena: Secondary | ICD-10-CM

## 2018-04-21 LAB — CBC WITH DIFFERENTIAL (CANCER CENTER ONLY)
BASOS PCT: 5 %
Basophils Absolute: 0.1 10*3/uL (ref 0.0–0.1)
Eosinophils Absolute: 0.1 10*3/uL (ref 0.0–0.5)
Eosinophils Relative: 3 %
HEMATOCRIT: 20.6 % — AB (ref 38.4–49.9)
HEMOGLOBIN: 6.9 g/dL — AB (ref 13.0–17.1)
Lymphocytes Relative: 41 %
Lymphs Abs: 0.8 10*3/uL — ABNORMAL LOW (ref 0.9–3.3)
MCH: 30.3 pg (ref 27.2–33.4)
MCHC: 33.3 g/dL (ref 32.0–36.0)
MCV: 90.7 fL (ref 79.3–98.0)
MONO ABS: 0.4 10*3/uL (ref 0.1–0.9)
MONOS PCT: 23 %
NEUTROS ABS: 0.5 10*3/uL — AB (ref 1.5–6.5)
NEUTROS PCT: 28 %
Platelet Count: 41 10*3/uL — ABNORMAL LOW (ref 140–400)
RBC: 2.27 MIL/uL — ABNORMAL LOW (ref 4.20–5.82)
RDW: 25.8 % — AB (ref 11.0–14.6)
WBC Count: 1.9 10*3/uL — ABNORMAL LOW (ref 4.0–10.3)

## 2018-04-21 LAB — SAMPLE TO BLOOD BANK

## 2018-04-21 NOTE — Progress Notes (Addendum)
  [place] OFFICE PROGRESS NOTE   Diagnosis: Pancytopenia  INTERVAL HISTORY:   Andrew [name] returns as scheduled.  He reports that he feels well.  He denies shortness of breath.  He has a good energy level.  No chest pain.  He occasionally notes gum bleeding after brushing his teeth.  No other bleeding.  No fever.  He has occasional sweats.  No cough.  No urinary symptoms.  Objective:  Vital signs in last 24 hours:  Blood pressure (!) 113/52, pulse 72, temperature 99.1 F (37.3 C), temperature source Oral, resp. rate 18, height _0  (1.702 m), weight 175 lb 11.2 oz (79.7 kg), SpO2 100 %.    HEENT: No thrush or ulcers. Resp: Distant breath sounds.  No respiratory distress. Cardio: Regular rate and rhythm. GI: Abdomen soft and nontender.  No hepatospleno megaly. Vascular: No leg edema.   Lab Results:  Lab Results  Component Value Date   WBC 1.9 (L) 04/21/2018   HGB 6.9 (LL) 04/21/2018   HCT 20.6 (L) 04/21/2018   MCV 90.7 04/21/2018   PLT 41 (L) 04/21/2018   NEUTROABS 0.5 (L) 04/21/2018    Imaging:  No results found.  Medications: I have reviewed the patient's current medications.  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 2.Renal insufficiency 3.Diabetes 4.Hemoccult positive stool  Upper endoscopy 04/03/2018-chronic gastritis, duodenitis  Colonoscopy 04/04/2018- diverticulosis in the sigmoid colon 5.Right lower lobe 7 mm subpleural nodule on chest CT 04/02/2018    Disposition: [name] appears stable.  We reviewed the CBC from today.  He has progressive anemia.  We are making arrangements for a blood transfusion in the next 1 to 2 days.  He understands he is at increased  risk for infection and bleeding and will contact the office with signs/symptoms of either.  Dr. [name] reviewed the cytogenetics report from the recent bone marrow.  A referral has been made to the leukemia service at [facility] for recommendations regarding management of the myelodysplasia.  He will return for lab and follow-up in approximately 2 weeks.  He will contact the office in the interim as outlined above or with any other problems.  Patient seen with Dr. [name].    [name] ANP/GNP-BC   04/21/2018  3:10 PM This was a shared visit with [name].  Andrew [name] has persistent pancytopenia.  The hemoglobin is lower.  He will be scheduled for a red cell transfusion this week. The cytogenetics from the bone marrow biopsy returned with a complex karyotype including chromosome 5 and chromosome 7 abnormalities.  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  He has been referred to the leukemia service at [facility] for treatment recommendations.  [name], MD

## 2018-04-21 NOTE — Telephone Encounter (Signed)
Appointments scheduled AVS/Calendar printed per 7/29 los °

## 2018-04-22 ENCOUNTER — Inpatient Hospital Stay: Payer: BLUE CROSS/BLUE SHIELD

## 2018-04-22 ENCOUNTER — Other Ambulatory Visit: Payer: Self-pay

## 2018-04-22 ENCOUNTER — Telehealth: Payer: Self-pay | Admitting: Oncology

## 2018-04-22 DIAGNOSIS — D61818 Other pancytopenia: Secondary | ICD-10-CM

## 2018-04-22 DIAGNOSIS — Z720 Tobacco use: Secondary | ICD-10-CM

## 2018-04-22 DIAGNOSIS — G4730 Sleep apnea, unspecified: Secondary | ICD-10-CM

## 2018-04-22 LAB — ABO/RH: ABO/RH(D): O POS

## 2018-04-22 LAB — PREPARE RBC (CROSSMATCH)

## 2018-04-22 MED ORDER — SODIUM CHLORIDE 0.9% IV SOLUTION
250.0000 mL | Freq: Once | INTRAVENOUS | Status: AC
  Filled 2018-04-22: qty 250

## 2018-04-22 MED ORDER — SODIUM CHLORIDE 0.9% FLUSH
10.0000 mL | INTRAVENOUS | Status: AC | PRN
  Filled 2018-04-22: qty 10

## 2018-04-22 NOTE — Telephone Encounter (Signed)
Pt appt with Dr. Florene Glen at [facility] id 05/02/18 @ 1:45-arrival, 2:00-labs and 2:30-Dr. Florene Glen. Call pt left vm in ref to appt.

## 2018-04-22 NOTE — Patient Instructions (Signed)

## 2018-04-23 ENCOUNTER — Telehealth: Payer: Self-pay

## 2018-04-23 LAB — BPAM RBC
BLOOD PRODUCT EXPIRATION DATE: 201909032359
BLOOD PRODUCT EXPIRATION DATE: 201909032359
ISSUE DATE / TIME: 201907300901
ISSUE DATE / TIME: 201907300901
UNIT TYPE AND RH: 5100
Unit Type and Rh: 5100

## 2018-04-23 LAB — TYPE AND SCREEN
ABO/RH(D): O POS
ANTIBODY SCREEN: NEGATIVE
UNIT DIVISION: 0
Unit division: 0

## 2018-04-23 NOTE — Telephone Encounter (Signed)
Received call from pt stating "im returning a call". This RN conveyed information from [employer] regarding appt with Dr. Florene Glen, made pt aware of appts. Pt voiced understanding.

## 2018-05-06 ENCOUNTER — Inpatient Hospital Stay (HOSPITAL_BASED_OUTPATIENT_CLINIC_OR_DEPARTMENT_OTHER): Payer: Medicare Other | Admitting: Oncology

## 2018-05-06 ENCOUNTER — Inpatient Hospital Stay: Payer: Medicare Other | Attending: Oncology

## 2018-05-06 ENCOUNTER — Telehealth: Payer: Self-pay | Admitting: Oncology

## 2018-05-06 ENCOUNTER — Inpatient Hospital Stay: Payer: Medicare Other

## 2018-05-06 ENCOUNTER — Other Ambulatory Visit: Payer: Self-pay | Admitting: Nurse Practitioner

## 2018-05-06 VITALS — BP 116/56 | HR 78 | Temp 98.9°F | Resp 17

## 2018-05-06 DIAGNOSIS — D61818 Other pancytopenia: Secondary | ICD-10-CM | POA: Insufficient documentation

## 2018-05-06 DIAGNOSIS — D7589 Other specified diseases of blood and blood-forming organs: Secondary | ICD-10-CM | POA: Diagnosis not present

## 2018-05-06 DIAGNOSIS — D469 Myelodysplastic syndrome, unspecified: Secondary | ICD-10-CM

## 2018-05-06 DIAGNOSIS — R195 Other fecal abnormalities: Secondary | ICD-10-CM | POA: Insufficient documentation

## 2018-05-06 DIAGNOSIS — N289 Disorder of kidney and ureter, unspecified: Secondary | ICD-10-CM | POA: Diagnosis not present

## 2018-05-06 DIAGNOSIS — R918 Other nonspecific abnormal finding of lung field: Secondary | ICD-10-CM

## 2018-05-06 DIAGNOSIS — K1379 Other lesions of oral mucosa: Secondary | ICD-10-CM | POA: Insufficient documentation

## 2018-05-06 DIAGNOSIS — R5081 Fever presenting with conditions classified elsewhere: Secondary | ICD-10-CM | POA: Diagnosis not present

## 2018-05-06 DIAGNOSIS — Z5111 Encounter for antineoplastic chemotherapy: Secondary | ICD-10-CM | POA: Insufficient documentation

## 2018-05-06 DIAGNOSIS — Z79899 Other long term (current) drug therapy: Secondary | ICD-10-CM

## 2018-05-06 DIAGNOSIS — E119 Type 2 diabetes mellitus without complications: Secondary | ICD-10-CM | POA: Diagnosis not present

## 2018-05-06 DIAGNOSIS — Z7189 Other specified counseling: Secondary | ICD-10-CM | POA: Insufficient documentation

## 2018-05-06 DIAGNOSIS — K5730 Diverticulosis of large intestine without perforation or abscess without bleeding: Secondary | ICD-10-CM | POA: Insufficient documentation

## 2018-05-06 LAB — CBC WITH DIFFERENTIAL (CANCER CENTER ONLY)
BASOS ABS: 0.1 10*3/uL (ref 0.0–0.1)
BASOS PCT: 4 %
Eosinophils Absolute: 0.1 10*3/uL (ref 0.0–0.5)
Eosinophils Relative: 2 %
HEMATOCRIT: 18.7 % — AB (ref 38.4–49.9)
HEMOGLOBIN: 6.3 g/dL — AB (ref 13.0–17.1)
Lymphocytes Relative: 64 %
Lymphs Abs: 1.5 10*3/uL (ref 0.9–3.3)
MCH: 28.5 pg (ref 27.2–33.4)
MCHC: 33.7 g/dL (ref 32.0–36.0)
MCV: 84.6 fL (ref 79.3–98.0)
MONO ABS: 0.3 10*3/uL (ref 0.1–0.9)
Monocytes Relative: 15 %
NEUTROS ABS: 0.3 10*3/uL — AB (ref 1.5–6.5)
NEUTROS PCT: 15 %
Platelet Count: 19 10*3/uL — ABNORMAL LOW (ref 140–400)
RBC: 2.21 MIL/uL — AB (ref 4.20–5.82)
RDW: 18.4 % — AB (ref 11.0–14.6)
WBC: 2.3 10*3/uL — AB (ref 4.0–10.3)

## 2018-05-06 LAB — CMP (CANCER CENTER ONLY)
ALBUMIN: 3.3 g/dL — AB (ref 3.5–5.0)
ALT: 7 U/L (ref 0–44)
ANION GAP: 13 (ref 5–15)
AST: 9 U/L — ABNORMAL LOW (ref 15–41)
Alkaline Phosphatase: 87 U/L (ref 38–126)
BILIRUBIN TOTAL: 0.5 mg/dL (ref 0.3–1.2)
BUN: 33 mg/dL — ABNORMAL HIGH (ref 8–23)
CO2: 19 mmol/L — AB (ref 22–32)
Calcium: 9.5 mg/dL (ref 8.9–10.3)
Chloride: 100 mmol/L (ref 98–111)
Creatinine: 1.51 mg/dL — ABNORMAL HIGH (ref 0.61–1.24)
GFR, Est AFR Am: 55 mL/min — ABNORMAL LOW (ref >60)
GFR, Estimated: 47 mL/min — ABNORMAL LOW (ref >60)
GLUCOSE: 126 mg/dL — AB (ref 70–99)
POTASSIUM: 4.2 mmol/L (ref 3.5–5.1)
SODIUM: 132 mmol/L — AB (ref 135–145)
TOTAL PROTEIN: 8.7 g/dL — AB (ref 6.5–8.1)

## 2018-05-06 LAB — PREPARE RBC (CROSSMATCH)

## 2018-05-06 LAB — SAMPLE TO BLOOD BANK

## 2018-05-06 MED ORDER — ONDANSETRON HCL 8 MG PO TABS
8.0000 mg | ORAL_TABLET | Freq: Once | ORAL | Status: AC
  Administered 2018-05-06: 8 mg via ORAL

## 2018-05-06 MED ORDER — SODIUM CHLORIDE 0.9% IV SOLUTION
250.0000 mL | Freq: Once | INTRAVENOUS | Status: AC
  Administered 2018-05-06: 250 mL via INTRAVENOUS
  Filled 2018-05-06: qty 250

## 2018-05-06 MED ORDER — ONDANSETRON HCL 8 MG PO TABS
ORAL_TABLET | ORAL | Status: AC
  Filled 2018-05-06: qty 1

## 2018-05-06 MED ORDER — AZACITIDINE CHEMO SQ INJECTION
150.0000 mg | Freq: Once | INTRAMUSCULAR | Status: AC
  Administered 2018-05-06: 150 mg via SUBCUTANEOUS
  Filled 2018-05-06: qty 6

## 2018-05-06 NOTE — Progress Notes (Signed)
[facility] OFFICE PROGRESS NOTE   Diagnosis: Myelodysplasia  INTERVAL HISTORY:   Andrew [name] returns for a scheduled visit.  He saw Dr. Florene Glen on 05/02/2018.  The hemoglobin returned at 7.5, platelets 15,000, absolute neutrophil count 0.2.  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  No increase in blasts was noted.  Dr. Florene Glen recommends treatment with azacytidine.  Andrew [name] reports feeling well.  No fever.  He has some bleeding when he brushes his teeth.  No other bleeding.   Objective:  Vital signs in last 24 hours:  Blood pressure (!) 99/42, pulse 72, temperature 98.2 F (36.8 C), temperature source Oral, resp. rate 16, height '5\' 7"'$  (1.702 m), weight 173 lb 4.8 oz (78.6 kg), SpO2 100 %.    HEENT: Small ulcer at the upper right anterior gumline without active bleeding.  No thrush. Resp: Lungs clear bilaterally Cardio: Regular rate and rhythm GI: No hepatosplenomegaly, nontender Vascular: No leg edema  Skin: No petechiae or ecchymoses  Portacath/PICC-without erythema  Lab Results:  Lab Results  Component Value Date   WBC 2.3 (L) 05/06/2018   HGB 6.3 (LL) 05/06/2018   HCT 18.7 (L) 05/06/2018   MCV 84.6 05/06/2018   PLT 19 (L) 05/06/2018   NEUTROABS 0.3 (LL) 05/06/2018    CMP  Lab Results  Component Value Date   NA 132 (L) 05/06/2018   K 4.2 05/06/2018   CL 100 05/06/2018   CO2 19 (L) 05/06/2018   GLUCOSE 126 (H) 05/06/2018   BUN 33 (H) 05/06/2018   CREATININE 1.51 (H) 05/06/2018   CALCIUM 9.5 05/06/2018   PROT 8.7 (H) 05/06/2018   ALBUMIN 3.3 (L) 05/06/2018   AST 9 (L) 05/06/2018   ALT 7 05/06/2018   ALKPHOS 87 05/06/2018   BILITOT 0.5 05/06/2018   GFRNONAA 47 (L) 05/06/2018   GFRAA 55 (L) 05/06/2018    Medications: I have reviewed the patient's current medications.   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 2.Renal insufficiency 3.Diabetes 4.Hemoccult positive stool  Upper endoscopy 04/03/2018-chronic gastritis, duodenitis  Colonoscopy 04/04/2018-diverticulosis in the sigmoid colon 5.Right lower lobe 7 mm subpleural nodule on chest CT 04/02/2018     Disposition: Andrew [name] has been diagnosed with myelodysplasia.  He has high risk disease based on the cytopenias and cytogenetics.  He has a significant risk of developing acute leukemia over the next few years.  I discussed the prognosis and treatment options with Andrew [name] and his daughter.  He agrees to treatment with 5 azacytidine.  We reviewed potential toxicities associated with 5-azacytidine including the chance for rash, diarrhea, nausea, and hematologic toxicity.  He understands the potential for infection and bleeding.  He will receive 2 units of packed red blood cells today.  He will begin 5 azacytidine today.  Andrew [name] will return for a CBC on 05/12/2018.  We will transfuse packed red blood cells and platelets as needed.  He will be scheduled for an office visit 05/20/2018.  Andrew [name] will contact us for a fever or bleeding.  We will contact Dr. Marlou Sa to consider adjusting the antihypertensive regimen as his blood pressure has been low on multiple office visits here.  45 minutes were spent with  the patient today.  The majority of the time was used for counseling and coordination of care.  Betsy Coder, MD  05/06/2018  1:54 PM

## 2018-05-06 NOTE — Progress Notes (Signed)
START ON PATHWAY REGIMEN - MDS     A cycle is every 28 days:     Azacitidine   **Always confirm dose/schedule in your pharmacy ordering system**  Patient Characteristics: Higher-Risk (IPSS-R Score > 3.5), First Line, Transplant Candidate WHO Disease Classification: MDS-MLD Bone Marrow Blasts (percent): ? 2% Cytogenetic Category: Poor Platelets (x 10^9/L): < 50 Absolute Neutrophil Count (x 10^9/L): < 0.8 Line of Therapy: First Line IPSS-R Risk Category: High IPSS-R Risk Score: 6 Check here if patient's risk score was calculated prior to the International Prognostic Scoring System-Revised (IPSS-R): false Hemoglobin (g/dl): < 8 Patient Characteristics: Transplant Candidate Intent of Therapy: Curative Intent, Not Discussed with Patient

## 2018-05-06 NOTE — Telephone Encounter (Signed)
Scheduled appt per 8/13 los - gave patient AVS and calender per los.  

## 2018-05-06 NOTE — Progress Notes (Signed)
Per dr. [name] ok to treat with hgb 6.3, plt 19, ANC 0.3 & creat. 1.51.

## 2018-05-06 NOTE — Patient Instructions (Addendum)
[facility] Discharge Instructions for Patients Receiving Chemotherapy  Today you received the following chemotherapy agents:  Vidaza.  To help prevent nausea and vomiting after your treatment, we encourage you to take your nausea medication as directed.   If you develop nausea and vomiting that is not controlled by your nausea medication, call the clinic.   BELOW ARE SYMPTOMS THAT SHOULD BE REPORTED IMMEDIATELY:  *FEVER GREATER THAN 100.5 F  *CHILLS WITH OR WITHOUT FEVER  NAUSEA AND VOMITING THAT IS NOT CONTROLLED WITH YOUR NAUSEA MEDICATION  *UNUSUAL SHORTNESS OF BREATH  *UNUSUAL BRUISING OR BLEEDING  TENDERNESS IN MOUTH AND THROAT WITH OR WITHOUT PRESENCE OF ULCERS  *URINARY PROBLEMS  *BOWEL PROBLEMS  UNUSUAL RASH Items with * indicate a potential emergency and should be followed up as soon as possible.  Feel free to call the clinic should you have any questions or concerns. The clinic phone number is [phone].  Please show the [facility] at check-in to the Emergency Department and triage nurse.    Azacitidine suspension for injection (subcutaneous use) What is this medicine? AZACITIDINE (ay [place]) is a chemotherapy drug. This medicine reduces the growth of cancer cells and can suppress the immune system. It is used for treating myelodysplastic syndrome or some types of leukemia. This medicine may be used for other purposes; ask your health care provider or pharmacist if you have questions. COMMON BRAND NAME(S): Vidaza What should I tell my health care provider before I take this medicine? They need to know if you have any of these conditions: -kidney disease -liver disease -liver tumors -an unusual or allergic reaction to azacitidine, mannitol, other medicines, foods, dyes, or preservatives -pregnant or trying to get pregnant -breast-feeding How should I use this medicine? This medicine is for injection under the skin. It is  administered in a hospital or clinic by a specially trained health care professional. Talk to your pediatrician regarding the use of this medicine in children. While this drug may be prescribed for selected conditions, precautions do apply. Overdosage: If you think you have taken too much of this medicine contact a poison control center or emergency room at once. NOTE: This medicine is only for you. Do not share this medicine with others. What if I miss a dose? It is important not to miss your dose. Call your doctor or health care professional if you are unable to keep an appointment. What may interact with this medicine? Interactions have not been studied. Give your health care provider a list of all the medicines, herbs, non-prescription drugs, or dietary supplements you use. Also tell them if you smoke, drink alcohol, or use illegal drugs. Some items may interact with your medicine. This list may not describe all possible interactions. Give your health care provider a list of all the medicines, herbs, non-prescription drugs, or dietary supplements you use. Also tell them if you smoke, drink alcohol, or use illegal drugs. Some items may interact with your medicine. What should I watch for while using this medicine? Visit your doctor for checks on your progress. This drug may make you feel generally unwell. This is not uncommon, as chemotherapy can affect healthy cells as well as cancer cells. Report any side effects. Continue your course of treatment even though you feel ill unless your doctor tells you to stop. In some cases, you may be given additional medicines to help with side effects. Follow all directions for their use. Call your doctor or health  care professional for advice if you get a fever, chills or sore throat, or other symptoms of a cold or flu. Do not treat yourself. This drug decreases your body's ability to fight infections. Try to avoid being around people who are sick. This  medicine may increase your risk to bruise or bleed. Call your doctor or health care professional if you notice any unusual bleeding. You may need blood work done while you are taking this medicine. Do not become pregnant while taking this medicine and for 6 months after the last dose. Women should inform their doctor if they wish to become pregnant or think they might be pregnant. Men should not father a child while taking this medicine and for 3 months after the last dose. There is a potential for serious side effects to an unborn child. Talk to your health care professional or pharmacist for more information. Do not breast-feed an infant while taking this medicine and for 1 week after the last dose. This medicine may interfere with the ability to have a child. Talk with your doctor or health care professional if you are concerned about your fertility. What side effects may I notice from receiving this medicine? Side effects that you should report to your doctor or health care professional as soon as possible: -allergic reactions like skin rash, itching or hives, swelling of the face, lips, or tongue -low blood counts - this medicine may decrease the number of white blood cells, red blood cells and platelets. You may be at increased risk for infections and bleeding. -signs of infection - fever or chills, cough, sore throat, pain passing urine -signs of decreased platelets or bleeding - bruising, pinpoint red spots on the skin, black, tarry stools, blood in the urine -signs of decreased red blood cells - unusually weak or tired, fainting spells, lightheadedness -signs and symptoms of kidney injury like trouble passing urine or change in the amount of urine -signs and symptoms of liver injury like dark yellow or brown urine; general ill feeling or flu-like symptoms; light-colored stools; loss of appetite; nausea; right upper belly pain; unusually weak or tired; yellowing of the eyes or skin Side effects  that usually do not require medical attention (report to your doctor or health care professional if they continue or are bothersome): -constipation -diarrhea -nausea, vomiting -pain or redness at the injection site -unusually weak or tired This list may not describe all possible side effects. Call your doctor for medical advice about side effects. You may report side effects to FDA at 1-800-FDA-1088. Where should I keep my medicine? This drug is given in a hospital or clinic and will not be stored at home. NOTE: This sheet is a summary. It may not cover all possible information. If you have questions about this medicine, talk to your doctor, pharmacist, or health care provider.  2018 Elsevier/Gold Standard (2016-10-09 14:37:51)    Blood Transfusion, Adult A blood transfusion is a procedure in which you receive donated blood, including plasma, platelets, and red blood cells, through an IV tube. You may need a blood transfusion because of illness, surgery, or injury. The blood may come from a donor. You may also be able to donate blood for yourself (autologous blood donation) before a surgery if you know that you might require a blood transfusion. The blood given in a transfusion is made up of different types of cells. You may receive:  Red blood cells. These carry oxygen to the cells in the body.  White blood  cells. These help you fight infections.  Platelets. These help your blood to clot.  Plasma. This is the liquid part of your blood and it helps with fluid imbalances.  If you have hemophilia or another clotting disorder, you may also receive other types of blood products. Tell a health care provider about:  Any allergies you have.  All medicines you are taking, including vitamins, herbs, eye drops, creams, and over-the-counter medicines.  Any problems you or family members have had with anesthetic medicines.  Any blood disorders you have.  Any surgeries you have had.  Any  medical conditions you have, including any recent fever or cold symptoms.  Whether you are pregnant or may be pregnant.  Any previous reactions you have had during a blood transfusion. What are the risks? Generally, this is a safe procedure. However, problems may occur, including:  Having an allergic reaction to something in the donated blood. Hives and itching may be symptoms of this type of reaction.  Fever. This may be a reaction to the white blood cells in the transfused blood. Nausea or chest pain may accompany a fever.  Iron overload. This can happen from having many transfusions.  Transfusion-related acute lung injury (TRALI). This is a rare reaction that causes lung damage. The cause is not known.TRALI can occur within hours of a transfusion or several days later.  Sudden (acute) or delayed hemolytic reactions. This happens if your blood does not match the cells in your transfusion. Your body's defense system (immune system) may try to attack the new cells. This complication is rare. The symptoms include fever, chills, nausea, and low back pain or chest pain.  Infection or disease transmission. This is rare.  What happens before the procedure?  You will have a blood test to determine your blood type. This is necessary to know what kind of blood your body will accept and to match it to the donor blood.  If you are going to have a planned surgery, you may be able to do an autologous blood donation. This may be done in case you need to have a transfusion.  If you have had an allergic reaction to a transfusion in the past, you may be given medicine to help prevent a reaction. This medicine may be given to you by mouth or through an IV tube.  You will have your temperature, blood pressure, and pulse monitored before the transfusion.  Follow instructions from your health care provider about eating and drinking restrictions.  Ask your health care provider about: ? Changing or  stopping your regular medicines. This is especially important if you are taking diabetes medicines or blood thinners. ? Taking medicines such as aspirin and ibuprofen. These medicines can thin your blood. Do not take these medicines before your procedure if your health care provider instructs you not to. What happens during the procedure?  An IV tube will be inserted into one of your veins.  The bag of donated blood will be attached to your IV tube. The blood will then enter through your vein.  Your temperature, blood pressure, and pulse will be monitored regularly during the transfusion. This monitoring is done to detect early signs of a transfusion reaction.  If you have any signs or symptoms of a reaction, your transfusion will be stopped and you may be given medicine.  When the transfusion is complete, your IV tube will be removed.  Pressure may be applied to the IV site for a few minutes.  A  bandage (dressing) will be applied. The procedure may vary among health care providers and hospitals. What happens after the procedure?  Your temperature, blood pressure, heart rate, breathing rate, and blood oxygen level will be monitored often.  Your blood may be tested to see how you are responding to the transfusion.  You may be warmed with fluids or blankets to maintain a normal body temperature. Summary  A blood transfusion is a procedure in which you receive donated blood, including plasma, platelets, and red blood cells, through an IV tube.  Your temperature, blood pressure, and pulse will be monitored before, during, and after the transfusion.  Your blood may be tested after the transfusion to see how your body has responded. This information is not intended to replace advice given to you by your health care provider. Make sure you discuss any questions you have with your health care provider. Document Released: 09/07/2000 Document Revised: 06/07/2016 Document Reviewed:  06/07/2016 Elsevier Interactive Patient Education  [employer].

## 2018-05-07 ENCOUNTER — Inpatient Hospital Stay: Payer: Medicare Other

## 2018-05-07 ENCOUNTER — Other Ambulatory Visit: Payer: Self-pay | Admitting: Nurse Practitioner

## 2018-05-07 VITALS — BP 123/64 | HR 82 | Temp 98.8°F | Resp 17

## 2018-05-07 DIAGNOSIS — D469 Myelodysplastic syndrome, unspecified: Secondary | ICD-10-CM | POA: Diagnosis not present

## 2018-05-07 DIAGNOSIS — D61818 Other pancytopenia: Secondary | ICD-10-CM

## 2018-05-07 LAB — TYPE AND SCREEN
ABO/RH(D): O POS
ANTIBODY SCREEN: NEGATIVE
UNIT DIVISION: 0
Unit division: 0

## 2018-05-07 LAB — BPAM RBC
BLOOD PRODUCT EXPIRATION DATE: 201909082359
Blood Product Expiration Date: 201909082359
ISSUE DATE / TIME: 201908131003
ISSUE DATE / TIME: 201908131003
UNIT TYPE AND RH: 5100
Unit Type and Rh: 5100

## 2018-05-07 MED ORDER — ONDANSETRON HCL 8 MG PO TABS
8.0000 mg | ORAL_TABLET | Freq: Once | ORAL | Status: AC
  Administered 2018-05-07: 8 mg via ORAL

## 2018-05-07 MED ORDER — ONDANSETRON HCL 8 MG PO TABS
ORAL_TABLET | ORAL | Status: AC
  Filled 2018-05-07: qty 1

## 2018-05-07 MED ORDER — PROCHLORPERAZINE MALEATE 5 MG PO TABS: 5.0000 mg | ORAL_TABLET | Freq: Four times a day (QID) | ORAL | 0 refills | Status: DC | PRN

## 2018-05-07 MED ORDER — PROCHLORPERAZINE MALEATE 5 MG PO TABS: 5.0000 mg | ORAL_TABLET | Freq: Four times a day (QID) | ORAL | 1 refills | Status: DC | PRN

## 2018-05-07 MED ORDER — AZACITIDINE CHEMO SQ INJECTION
78.0000 mg/m2 | Freq: Once | INTRAMUSCULAR | Status: AC
  Administered 2018-05-07: 150 mg via SUBCUTANEOUS
  Filled 2018-05-07: qty 6

## 2018-05-07 NOTE — Patient Instructions (Signed)
[facility] Discharge Instructions for Patients Receiving Chemotherapy  Today you received the following chemotherapy agents Vidaza  To help prevent nausea and vomiting after your treatment, we encourage you to take your nausea medication as directed   If you develop nausea and vomiting that is not controlled by your nausea medication, call the clinic.   BELOW ARE SYMPTOMS THAT SHOULD BE REPORTED IMMEDIATELY:  *FEVER GREATER THAN 100.5 F  *CHILLS WITH OR WITHOUT FEVER  NAUSEA AND VOMITING THAT IS NOT CONTROLLED WITH YOUR NAUSEA MEDICATION  *UNUSUAL SHORTNESS OF BREATH  *UNUSUAL BRUISING OR BLEEDING  TENDERNESS IN MOUTH AND THROAT WITH OR WITHOUT PRESENCE OF ULCERS  *URINARY PROBLEMS  *BOWEL PROBLEMS  UNUSUAL RASH Items with * indicate a potential emergency and should be followed up as soon as possible.  Feel free to call the clinic should you have any questions or concerns. The clinic phone number is [phone].  Please show the CHEMO ALERT CARD at check-in to the Emergency Department and triage nurse.  Azacitidine suspension for injection (subcutaneous use) What is this medicine? AZACITIDINE (ay za SITE i deen) is a chemotherapy drug. This medicine reduces the growth of cancer cells and can suppress the immune system. It is used for treating myelodysplastic syndrome or some types of leukemia. This medicine may be used for other purposes; ask your health care provider or pharmacist if you have questions. COMMON BRAND NAME(S): Vidaza What should I tell my health care provider before I take this medicine? They need to know if you have any of these conditions: -kidney disease -liver disease -liver tumors -an unusual or allergic reaction to azacitidine, mannitol, other medicines, foods, dyes, or preservatives -pregnant or trying to get pregnant -breast-feeding How should I use this medicine? This medicine is for injection under the skin. It is  administered in a hospital or clinic by a specially trained health care professional. Talk to your pediatrician regarding the use of this medicine in children. While this drug may be prescribed for selected conditions, precautions do apply. Overdosage: If you think you have taken too much of this medicine contact a poison control center or emergency room at once. NOTE: This medicine is only for you. Do not share this medicine with others. What if I miss a dose? It is important not to miss your dose. Call your doctor or health care professional if you are unable to keep an appointment. What may interact with this medicine? Interactions have not been studied. Give your health care provider a list of all the medicines, herbs, non-prescription drugs, or dietary supplements you use. Also tell them if you smoke, drink alcohol, or use illegal drugs. Some items may interact with your medicine. This list may not describe all possible interactions. Give your health care provider a list of all the medicines, herbs, non-prescription drugs, or dietary supplements you use. Also tell them if you smoke, drink alcohol, or use illegal drugs. Some items may interact with your medicine. What should I watch for while using this medicine? Visit your doctor for checks on your progress. This drug may make you feel generally unwell. This is not uncommon, as chemotherapy can affect healthy cells as well as cancer cells. Report any side effects. Continue your course of treatment even though you feel ill unless your doctor tells you to stop. In some cases, you may be given additional medicines to help with side effects. Follow all directions for their use. Call your doctor or health care professional for   advice if you get a fever, chills or sore throat, or other symptoms of a cold or flu. Do not treat yourself. This drug decreases your body's ability to fight infections. Try to avoid being around people who are sick. This  medicine may increase your risk to bruise or bleed. Call your doctor or health care professional if you notice any unusual bleeding. You may need blood work done while you are taking this medicine. Do not become pregnant while taking this medicine and for 6 months after the last dose. Women should inform their doctor if they wish to become pregnant or think they might be pregnant. Men should not father a child while taking this medicine and for 3 months after the last dose. There is a potential for serious side effects to an unborn child. Talk to your health care professional or pharmacist for more information. Do not breast-feed an infant while taking this medicine and for 1 week after the last dose. This medicine may interfere with the ability to have a child. Talk with your doctor or health care professional if you are concerned about your fertility. What side effects may I notice from receiving this medicine? Side effects that you should report to your doctor or health care professional as soon as possible: -allergic reactions like skin rash, itching or hives, swelling of the face, lips, or tongue -low blood counts - this medicine may decrease the number of white blood cells, red blood cells and platelets. You may be at increased risk for infections and bleeding. -signs of infection - fever or chills, cough, sore throat, pain passing urine -signs of decreased platelets or bleeding - bruising, pinpoint red spots on the skin, black, tarry stools, blood in the urine -signs of decreased red blood cells - unusually weak or tired, fainting spells, lightheadedness -signs and symptoms of kidney injury like trouble passing urine or change in the amount of urine -signs and symptoms of liver injury like dark yellow or brown urine; general ill feeling or flu-like symptoms; light-colored stools; loss of appetite; nausea; right upper belly pain; unusually weak or tired; yellowing of the eyes or skin Side effects  that usually do not require medical attention (report to your doctor or health care professional if they continue or are bothersome): -constipation -diarrhea -nausea, vomiting -pain or redness at the injection site -unusually weak or tired This list may not describe all possible side effects. Call your doctor for medical advice about side effects. You may report side effects to FDA at 1-800-FDA-1088. Where should I keep my medicine? This drug is given in a hospital or clinic and will not be stored at home. NOTE: This sheet is a summary. It may not cover all possible information. If you have questions about this medicine, talk to your doctor, pharmacist, or health care provider.  2018 Elsevier/Gold Standard (2016-10-09 14:37:51)   

## 2018-05-08 ENCOUNTER — Inpatient Hospital Stay: Payer: Medicare Other

## 2018-05-08 VITALS — BP 109/58 | HR 77 | Temp 98.7°F | Resp 17

## 2018-05-08 DIAGNOSIS — D469 Myelodysplastic syndrome, unspecified: Secondary | ICD-10-CM

## 2018-05-08 DIAGNOSIS — D61818 Other pancytopenia: Secondary | ICD-10-CM

## 2018-05-08 MED ORDER — PROCHLORPERAZINE MALEATE 10 MG PO TABS
ORAL_TABLET | ORAL | Status: AC
  Filled 2018-05-08: qty 1

## 2018-05-08 MED ORDER — AZACITIDINE CHEMO SQ INJECTION
78.0000 mg/m2 | Freq: Once | INTRAMUSCULAR | Status: AC
  Administered 2018-05-08: 150 mg via SUBCUTANEOUS
  Filled 2018-05-08: qty 6

## 2018-05-08 MED ORDER — ONDANSETRON HCL 8 MG PO TABS
8.0000 mg | ORAL_TABLET | Freq: Once | ORAL | Status: AC
  Administered 2018-05-08: 8 mg via ORAL

## 2018-05-08 MED ORDER — ONDANSETRON HCL 8 MG PO TABS
ORAL_TABLET | ORAL | Status: AC
  Filled 2018-05-08: qty 1

## 2018-05-08 NOTE — Patient Instructions (Signed)
Cancer Center Discharge Instructions for Patients Receiving Chemotherapy  Today you received the following chemotherapy agents Vidaza  To help prevent nausea and vomiting after your treatment, we encourage you to take your nausea medication as directed  If you develop nausea and vomiting that is not controlled by your nausea medication, call the clinic.   BELOW ARE SYMPTOMS THAT SHOULD BE REPORTED IMMEDIATELY:  *FEVER GREATER THAN 100.5 F  *CHILLS WITH OR WITHOUT FEVER  NAUSEA AND VOMITING THAT IS NOT CONTROLLED WITH YOUR NAUSEA MEDICATION  *UNUSUAL SHORTNESS OF BREATH  *UNUSUAL BRUISING OR BLEEDING  TENDERNESS IN MOUTH AND THROAT WITH OR WITHOUT PRESENCE OF ULCERS  *URINARY PROBLEMS  *BOWEL PROBLEMS  UNUSUAL RASH Items with * indicate a potential emergency and should be followed up as soon as possible.  Feel free to call the clinic should you have any questions or concerns. The clinic phone number is [phone].  Please show the CHEMO ALERT CARD at check-in to the Emergency Department and triage nurse.   

## 2018-05-09 ENCOUNTER — Telehealth: Payer: Self-pay

## 2018-05-09 ENCOUNTER — Inpatient Hospital Stay: Payer: Medicare Other

## 2018-05-09 VITALS — BP 116/58 | HR 85 | Temp 98.9°F | Resp 17

## 2018-05-09 DIAGNOSIS — D469 Myelodysplastic syndrome, unspecified: Secondary | ICD-10-CM | POA: Diagnosis not present

## 2018-05-09 DIAGNOSIS — D61818 Other pancytopenia: Secondary | ICD-10-CM

## 2018-05-09 MED ORDER — AZACITIDINE CHEMO SQ INJECTION
78.0000 mg/m2 | Freq: Once | INTRAMUSCULAR | Status: AC
  Administered 2018-05-09: 150 mg via SUBCUTANEOUS
  Filled 2018-05-09: qty 6

## 2018-05-09 MED ORDER — ONDANSETRON HCL 8 MG PO TABS
8.0000 mg | ORAL_TABLET | Freq: Once | ORAL | Status: AC
  Administered 2018-05-09: 8 mg via ORAL

## 2018-05-09 MED ORDER — ONDANSETRON HCL 8 MG PO TABS
ORAL_TABLET | ORAL | Status: AC
  Filled 2018-05-09: qty 1

## 2018-05-09 NOTE — Telephone Encounter (Signed)
Spoke with McKenya at Dr. Randel Pigg office to inform her that pt may need his BP meds adjusted, per Dr. [name]. Pt's BP has been running slightly low. McKenya informed that pt has upcoming appt in the beginning of September. This RN voiced understanding, made MD aware.

## 2018-05-09 NOTE — Progress Notes (Signed)
Patient has bleeding gums that started prior to treatment and has not worsened or changed since treatment started per patient report. Dr. [name] made aware and assess the p[atient gums, instructed patient to use salt water rinses, biotene, and soft mouth swabs to keep the area clean. He also recommended that the patient follow up with a dentist within the next week and to call aver the weekend if the bleeding gets worse. Patient verbalized understanding and had no other questions or concerns.

## 2018-05-09 NOTE — Patient Instructions (Signed)
[facility] Discharge Instructions for Patients Receiving Chemotherapy  Today you received the following chemotherapy agents Vidaza  To help prevent nausea and vomiting after your treatment, we encourage you to take your nausea medication as directed  If you develop nausea and vomiting that is not controlled by your nausea medication, call the clinic.   BELOW ARE SYMPTOMS THAT SHOULD BE REPORTED IMMEDIATELY:  *FEVER GREATER THAN 100.5 F  *CHILLS WITH OR WITHOUT FEVER  NAUSEA AND VOMITING THAT IS NOT CONTROLLED WITH YOUR NAUSEA MEDICATION  *UNUSUAL SHORTNESS OF BREATH  *UNUSUAL BRUISING OR BLEEDING  TENDERNESS IN MOUTH AND THROAT WITH OR WITHOUT PRESENCE OF ULCERS  *URINARY PROBLEMS  *BOWEL PROBLEMS  UNUSUAL RASH Items with * indicate a potential emergency and should be followed up as soon as possible.  Feel free to call the clinic should you have any questions or concerns. The clinic phone number is [phone].  Please show the CHEMO ALERT CARD at check-in to the Emergency Department and triage nurse.   

## 2018-05-12 ENCOUNTER — Other Ambulatory Visit: Payer: Self-pay | Admitting: Nurse Practitioner

## 2018-05-12 ENCOUNTER — Inpatient Hospital Stay (HOSPITAL_BASED_OUTPATIENT_CLINIC_OR_DEPARTMENT_OTHER): Payer: Medicare Other | Admitting: Oncology

## 2018-05-12 ENCOUNTER — Inpatient Hospital Stay: Payer: Medicare Other

## 2018-05-12 ENCOUNTER — Telehealth: Payer: Self-pay | Admitting: Oncology

## 2018-05-12 ENCOUNTER — Other Ambulatory Visit: Payer: Self-pay

## 2018-05-12 VITALS — BP 152/84 | HR 100 | Temp 98.8°F | Resp 19

## 2018-05-12 DIAGNOSIS — R918 Other nonspecific abnormal finding of lung field: Secondary | ICD-10-CM | POA: Diagnosis not present

## 2018-05-12 DIAGNOSIS — D469 Myelodysplastic syndrome, unspecified: Secondary | ICD-10-CM

## 2018-05-12 DIAGNOSIS — E119 Type 2 diabetes mellitus without complications: Secondary | ICD-10-CM

## 2018-05-12 DIAGNOSIS — D61818 Other pancytopenia: Secondary | ICD-10-CM

## 2018-05-12 DIAGNOSIS — D7589 Other specified diseases of blood and blood-forming organs: Secondary | ICD-10-CM | POA: Diagnosis not present

## 2018-05-12 DIAGNOSIS — Z79899 Other long term (current) drug therapy: Secondary | ICD-10-CM

## 2018-05-12 DIAGNOSIS — N289 Disorder of kidney and ureter, unspecified: Secondary | ICD-10-CM

## 2018-05-12 LAB — CBC WITH DIFFERENTIAL (CANCER CENTER ONLY)
Basophils Absolute: 0 10*3/uL (ref 0.0–0.1)
Basophils Relative: 3 %
EOS ABS: 0 10*3/uL (ref 0.0–0.5)
EOS PCT: 1 %
HEMATOCRIT: 18.9 % — AB (ref 38.4–49.9)
Hemoglobin: 6.3 g/dL — CL (ref 13.0–17.1)
LYMPHS ABS: 0.8 10*3/uL — AB (ref 0.9–3.3)
Lymphocytes Relative: 57 %
MCH: 28.5 pg (ref 27.2–33.4)
MCHC: 33.3 g/dL (ref 32.0–36.0)
MCV: 85.5 fL (ref 79.3–98.0)
MONOS PCT: 21 %
Monocytes Absolute: 0.3 10*3/uL (ref 0.1–0.9)
Neutro Abs: 0.3 10*3/uL — CL (ref 1.5–6.5)
Neutrophils Relative %: 18 %
Platelet Count: 7 10*3/uL — CL (ref 140–400)
RBC: 2.21 MIL/uL — ABNORMAL LOW (ref 4.20–5.82)
RDW: 17.3 % — AB (ref 11.0–14.6)
WBC Count: 1.4 10*3/uL — ABNORMAL LOW (ref 4.0–10.3)

## 2018-05-12 LAB — SAMPLE TO BLOOD BANK

## 2018-05-12 LAB — PREPARE RBC (CROSSMATCH)

## 2018-05-12 MED ORDER — AZACITIDINE CHEMO SQ INJECTION
77.5000 mg/m2 | Freq: Once | INTRAMUSCULAR | Status: AC
  Administered 2018-05-12: 150 mg via SUBCUTANEOUS
  Filled 2018-05-12: qty 6

## 2018-05-12 MED ORDER — ONDANSETRON HCL 8 MG PO TABS
8.0000 mg | ORAL_TABLET | Freq: Once | ORAL | Status: AC
  Administered 2018-05-12: 8 mg via ORAL

## 2018-05-12 MED ORDER — ONDANSETRON HCL 8 MG PO TABS
ORAL_TABLET | ORAL | Status: AC
  Filled 2018-05-12: qty 1

## 2018-05-12 MED ORDER — SODIUM CHLORIDE 0.9% IV SOLUTION
250.0000 mL | Freq: Once | INTRAVENOUS | Status: AC
  Administered 2018-05-12: 250 mL via INTRAVENOUS
  Filled 2018-05-12: qty 250

## 2018-05-12 NOTE — Patient Instructions (Signed)
[facility] Discharge Instructions for Patients Receiving Chemotherapy  Today you received the following chemotherapy agents Vidaza.  To help prevent nausea and vomiting after your treatment, we encourage you to take your nausea medication as directed.   If you develop nausea and vomiting that is not controlled by your nausea medication, call the clinic.   BELOW ARE SYMPTOMS THAT SHOULD BE REPORTED IMMEDIATELY:  *FEVER GREATER THAN 100.5 F  *CHILLS WITH OR WITHOUT FEVER  NAUSEA AND VOMITING THAT IS NOT CONTROLLED WITH YOUR NAUSEA MEDICATION  *UNUSUAL SHORTNESS OF BREATH  *UNUSUAL BRUISING OR BLEEDING  TENDERNESS IN MOUTH AND THROAT WITH OR WITHOUT PRESENCE OF ULCERS  *URINARY PROBLEMS  *BOWEL PROBLEMS  UNUSUAL RASH Items with * indicate a potential emergency and should be followed up as soon as possible.  Feel free to call the clinic should you have any questions or concerns. The clinic phone number is [phone].  Please show the CHEMO ALERT CARD at check-in to the Emergency Department and triage nurse.    Blood Transfusion, Care After This sheet gives you information about how to care for yourself after your procedure. Your doctor may also give you more specific instructions. If you have problems or questions, contact your doctor. Follow these instructions at home:  Take over-the-counter and prescription medicines only as told by your doctor.  Go back to your normal activities as told by your doctor.  Follow instructions from your doctor about how to take care of the area where an IV tube was put into your vein (insertion site). Make sure you: ? Wash your hands with soap and water before you change your bandage (dressing). If there is no soap and water, use hand sanitizer. ? Change your bandage as told by your doctor.  Check your IV insertion site every day for signs of infection. Check for: ? More redness, swelling, or pain. ? More fluid or  blood. ? Warmth. ? Pus or a bad smell. Contact a doctor if:  You have more redness, swelling, or pain around the IV insertion site..  You have more fluid or blood coming from the IV insertion site.  Your IV insertion site feels warm to the touch.  You have pus or a bad smell coming from the IV insertion site.  Your pee (urine) turns pink, red, or brown.  You feel weak after doing your normal activities. Get help right away if:  You have signs of a serious allergic or body defense (immune) system reaction, including: ? Itchiness. ? Hives. ? Trouble breathing. ? Anxiety. ? Pain in your chest or lower back. ? Fever, flushing, and chills. ? Fast pulse. ? Rash. ? Watery poop (diarrhea). ? Throwing up (vomiting). ? Dark pee. ? Serious headache. ? Dizziness. ? Stiff neck. ? Yellow color in your face or the white parts of your eyes (jaundice). Summary  After a blood transfusion, return to your normal activities as told by your doctor.  Every day, check for signs of infection where the IV tube was put into your vein.  Some signs of infection are warm skin, more redness and pain, more fluid or blood, and pus or a bad smell where the needle went in.  Contact your doctor if you feel weak or have any unusual symptoms. This information is not intended to replace advice given to you by your health care provider. Make sure you discuss any questions you have with your health care provider. Document Released: 10/01/2014 Document Revised: 05/04/2016 Document   05/04/2016 Elsevier Interactive Patient Education  2017 [employer].

## 2018-05-12 NOTE — Telephone Encounter (Signed)
Spoke to patient regarding upcoming aug appts updates per 8/19 sch message  °

## 2018-05-12 NOTE — Progress Notes (Signed)
  [facility] OFFICE PROGRESS NOTE   Diagnosis: Myelodysplasia  INTERVAL HISTORY:   Mr. Freiberger returns for day 5 of 5 azacytidine.  He reports feeling well.  No fever or bleeding.  Objective:  Vital signs in last 24 hours:  There were no vitals taken for this visit.    HEENT: Multiple ecchymoses at the gums and left buccal mucosa.  No thrush.  There is mild oozing at the right lower anterior gumline. Resp: Lungs clear bilaterally Cardio: Regular rate and rhythm GI: No hepatosplenomegaly, nontender Vascular: No leg edema  Skin: No petechiae, small ecchymoses at abdominal wall injection sites    Lab Results:  Lab Results  Component Value Date   WBC 1.4 (L) 05/12/2018   HGB 6.3 (LL) 05/12/2018   HCT 18.9 (L) 05/12/2018   MCV 85.5 05/12/2018   PLT 7 (LL) 05/12/2018   NEUTROABS 0.3 (LL) 05/12/2018    CMP  Lab Results  Component Value Date   NA 132 (L) 05/06/2018   K 4.2 05/06/2018   CL 100 05/06/2018   CO2 19 (L) 05/06/2018   GLUCOSE 126 (H) 05/06/2018   BUN 33 (H) 05/06/2018   CREATININE 1.51 (H) 05/06/2018   CALCIUM 9.5 05/06/2018   PROT 8.7 (H) 05/06/2018   ALBUMIN 3.3 (L) 05/06/2018   AST 9 (L) 05/06/2018   ALT 7 05/06/2018   ALKPHOS 87 05/06/2018   BILITOT 0.5 05/06/2018   GFRNONAA 47 (L) 05/06/2018   GFRAA 55 (L) 05/06/2018    Medications: I have reviewed the patient's current medications.   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  Cycle 1  5- azacytidine 05/06/2018 2.Renal insufficiency 3.Diabetes 4.Hemoccult positive stool  Upper endoscopy 04/03/2018-chronic gastritis, duodenitis  Colonoscopy  04/04/2018-diverticulosis in the sigmoid colon 5.Right lower lobe 7 mm subpleural nodule on chest CT 04/02/2018     Disposition: Mr. Loftin has persistent severe pancytopenia.  He has mouth bleeding on exam today.  He will be transfused with packed red blood cells and platelets today.  We will obtain a posttransfusion platelet count to be sure he obtains an adequate platelet increment. He will return for a repeat CBC on 05/15/2018.  Mr. Toren has poor peripheral IV access.  He will be referred for placement of a Port-A-Cath.  He will return for an office visit as scheduled on 05/20/2018.    Betsy Coder, MD  05/12/2018  2:09 PM

## 2018-05-12 NOTE — Telephone Encounter (Signed)
Patient scheduled per 8/19 sch message.  °

## 2018-05-12 NOTE — Progress Notes (Signed)
Per Dr. [name], ok to tx today with low platelets, rbc, hgb, and ANC.   Per Dr. [name], pt will need platelets today and 90min post-transfusion platelet count, cbc on Thursday, and PRBC today or tomorrow (05/12/18).

## 2018-05-12 NOTE — Telephone Encounter (Signed)
Lab for 8/22 and f/u for 8/27 already on schedule. [facility] will call patient re port a cath. No other orders per 8/19 los.

## 2018-05-13 LAB — TYPE AND SCREEN
ABO/RH(D): O POS
ANTIBODY SCREEN: NEGATIVE
Unit division: 0

## 2018-05-13 LAB — BPAM PLATELET PHERESIS
BLOOD PRODUCT EXPIRATION DATE: 201908202359
ISSUE DATE / TIME: 201908191141
Unit Type and Rh: 5100

## 2018-05-13 LAB — BPAM RBC
BLOOD PRODUCT EXPIRATION DATE: 201909172359
ISSUE DATE / TIME: 201908191141
UNIT TYPE AND RH: 5100

## 2018-05-13 LAB — PREPARE PLATELET PHERESIS: Unit division: 0

## 2018-05-15 ENCOUNTER — Other Ambulatory Visit: Payer: Self-pay

## 2018-05-15 ENCOUNTER — Other Ambulatory Visit: Payer: Self-pay | Admitting: Nurse Practitioner

## 2018-05-15 ENCOUNTER — Inpatient Hospital Stay: Payer: Medicare Other

## 2018-05-15 ENCOUNTER — Telehealth: Payer: Self-pay | Admitting: Oncology

## 2018-05-15 DIAGNOSIS — D61818 Other pancytopenia: Secondary | ICD-10-CM

## 2018-05-15 DIAGNOSIS — D469 Myelodysplastic syndrome, unspecified: Secondary | ICD-10-CM

## 2018-05-15 LAB — CBC WITH DIFFERENTIAL (CANCER CENTER ONLY)
Basophils Absolute: 0 10*3/uL (ref 0.0–0.1)
Basophils Relative: 3 %
Eosinophils Absolute: 0 10*3/uL (ref 0.0–0.5)
Eosinophils Relative: 2 %
HEMATOCRIT: 19.7 % — AB (ref 38.4–49.9)
HEMOGLOBIN: 6.8 g/dL — AB (ref 13.0–17.1)
LYMPHS PCT: 67 %
Lymphs Abs: 0.9 10*3/uL (ref 0.9–3.3)
MCH: 29.4 pg (ref 27.2–33.4)
MCHC: 34.5 g/dL (ref 32.0–36.0)
MCV: 85.3 fL (ref 79.3–98.0)
MONO ABS: 0.2 10*3/uL (ref 0.1–0.9)
MONOS PCT: 19 %
NEUTROS ABS: 0.1 10*3/uL — AB (ref 1.5–6.5)
NEUTROS PCT: 9 %
Platelet Count: 17 10*3/uL — ABNORMAL LOW (ref 140–400)
RBC: 2.31 MIL/uL — ABNORMAL LOW (ref 4.20–5.82)
RDW: 15.9 % — AB (ref 11.0–14.6)
WBC Count: 1.3 10*3/uL — ABNORMAL LOW (ref 4.0–10.3)

## 2018-05-15 LAB — PREPARE RBC (CROSSMATCH)

## 2018-05-15 MED ORDER — SODIUM CHLORIDE 0.9% IV SOLUTION
250.0000 mL | Freq: Once | INTRAVENOUS | Status: AC
  Administered 2018-05-15: 250 mL via INTRAVENOUS
  Filled 2018-05-15: qty 250

## 2018-05-15 MED ORDER — HEPARIN SOD (PORK) LOCK FLUSH 100 UNIT/ML IV SOLN
500.0000 [IU] | Freq: Every day | INTRAVENOUS | Status: DC | PRN
  Filled 2018-05-15: qty 5

## 2018-05-15 MED ORDER — SODIUM CHLORIDE 0.9% FLUSH
10.0000 mL | INTRAVENOUS | Status: DC | PRN
  Filled 2018-05-15: qty 10

## 2018-05-15 NOTE — Telephone Encounter (Signed)
Pt scheduled per 8/22 sch message.

## 2018-05-15 NOTE — Patient Instructions (Signed)

## 2018-05-16 ENCOUNTER — Other Ambulatory Visit: Payer: Medicare Other

## 2018-05-16 LAB — BPAM RBC
BLOOD PRODUCT EXPIRATION DATE: 201909202359
ISSUE DATE / TIME: 201908221039
UNIT TYPE AND RH: 5100

## 2018-05-16 LAB — PREPARE PLATELET PHERESIS: Unit division: 0

## 2018-05-16 LAB — TYPE AND SCREEN
ABO/RH(D): O POS
Antibody Screen: NEGATIVE
Unit division: 0

## 2018-05-16 LAB — BPAM PLATELET PHERESIS
Blood Product Expiration Date: 201908230953
ISSUE DATE / TIME: 201908221037
Unit Type and Rh: 5100

## 2018-05-20 ENCOUNTER — Encounter: Payer: Self-pay | Admitting: Nurse Practitioner

## 2018-05-20 ENCOUNTER — Inpatient Hospital Stay: Payer: Medicare Other

## 2018-05-20 ENCOUNTER — Inpatient Hospital Stay (HOSPITAL_COMMUNITY): Payer: Medicare Other

## 2018-05-20 ENCOUNTER — Encounter (HOSPITAL_COMMUNITY): Payer: Self-pay

## 2018-05-20 ENCOUNTER — Other Ambulatory Visit: Payer: Self-pay

## 2018-05-20 ENCOUNTER — Inpatient Hospital Stay (HOSPITAL_COMMUNITY)
Admission: AD | Admit: 2018-05-20 | Discharge: 2018-06-03 | DRG: 812 | Disposition: A | Discharge status: Home Health | Payer: Medicare Other | Attending: Oncology | Admitting: Oncology

## 2018-05-20 ENCOUNTER — Inpatient Hospital Stay (HOSPITAL_BASED_OUTPATIENT_CLINIC_OR_DEPARTMENT_OTHER): Payer: Medicare Other | Admitting: Nurse Practitioner

## 2018-05-20 VITALS — BP 127/61 | HR 90 | Temp 101.4°F | Resp 18 | Ht 67.0 in | Wt 166.3 lb

## 2018-05-20 DIAGNOSIS — K029 Dental caries, unspecified: Secondary | ICD-10-CM | POA: Diagnosis present

## 2018-05-20 DIAGNOSIS — Z9221 Personal history of antineoplastic chemotherapy: Secondary | ICD-10-CM | POA: Diagnosis not present

## 2018-05-20 DIAGNOSIS — K045 Chronic apical periodontitis: Secondary | ICD-10-CM | POA: Diagnosis present

## 2018-05-20 DIAGNOSIS — I10 Essential (primary) hypertension: Secondary | ICD-10-CM | POA: Diagnosis present

## 2018-05-20 DIAGNOSIS — K5900 Constipation, unspecified: Secondary | ICD-10-CM | POA: Diagnosis not present

## 2018-05-20 DIAGNOSIS — M264 Malocclusion, unspecified: Secondary | ICD-10-CM | POA: Diagnosis present

## 2018-05-20 DIAGNOSIS — D469 Myelodysplastic syndrome, unspecified: Secondary | ICD-10-CM

## 2018-05-20 DIAGNOSIS — N289 Disorder of kidney and ureter, unspecified: Secondary | ICD-10-CM | POA: Diagnosis present

## 2018-05-20 DIAGNOSIS — D61818 Other pancytopenia: Secondary | ICD-10-CM | POA: Diagnosis present

## 2018-05-20 DIAGNOSIS — I700 Atherosclerosis of aorta: Secondary | ICD-10-CM | POA: Diagnosis present

## 2018-05-20 DIAGNOSIS — K0889 Other specified disorders of teeth and supporting structures: Secondary | ICD-10-CM | POA: Diagnosis not present

## 2018-05-20 DIAGNOSIS — Z95828 Presence of other vascular implants and grafts: Secondary | ICD-10-CM | POA: Diagnosis not present

## 2018-05-20 DIAGNOSIS — R5081 Fever presenting with conditions classified elsewhere: Secondary | ICD-10-CM | POA: Diagnosis present

## 2018-05-20 DIAGNOSIS — Z87891 Personal history of nicotine dependence: Secondary | ICD-10-CM | POA: Diagnosis not present

## 2018-05-20 DIAGNOSIS — N2889 Other specified disorders of kidney and ureter: Secondary | ICD-10-CM

## 2018-05-20 DIAGNOSIS — Z79899 Other long term (current) drug therapy: Secondary | ICD-10-CM

## 2018-05-20 DIAGNOSIS — R195 Other fecal abnormalities: Secondary | ICD-10-CM | POA: Diagnosis not present

## 2018-05-20 DIAGNOSIS — K5730 Diverticulosis of large intestine without perforation or abscess without bleeding: Secondary | ICD-10-CM | POA: Diagnosis not present

## 2018-05-20 DIAGNOSIS — K0530 Chronic periodontitis, unspecified: Secondary | ICD-10-CM

## 2018-05-20 DIAGNOSIS — J449 Chronic obstructive pulmonary disease, unspecified: Secondary | ICD-10-CM | POA: Diagnosis present

## 2018-05-20 DIAGNOSIS — D7589 Other specified diseases of blood and blood-forming organs: Secondary | ICD-10-CM | POA: Diagnosis present

## 2018-05-20 DIAGNOSIS — Z91013 Allergy to seafood: Secondary | ICD-10-CM

## 2018-05-20 DIAGNOSIS — K089 Disorder of teeth and supporting structures, unspecified: Secondary | ICD-10-CM

## 2018-05-20 DIAGNOSIS — R911 Solitary pulmonary nodule: Secondary | ICD-10-CM

## 2018-05-20 DIAGNOSIS — K921 Melena: Secondary | ICD-10-CM | POA: Diagnosis not present

## 2018-05-20 DIAGNOSIS — F17211 Nicotine dependence, cigarettes, in remission: Secondary | ICD-10-CM | POA: Diagnosis not present

## 2018-05-20 DIAGNOSIS — D709 Neutropenia, unspecified: Secondary | ICD-10-CM

## 2018-05-20 DIAGNOSIS — H9202 Otalgia, left ear: Secondary | ICD-10-CM | POA: Diagnosis not present

## 2018-05-20 DIAGNOSIS — K1379 Other lesions of oral mucosa: Secondary | ICD-10-CM | POA: Diagnosis not present

## 2018-05-20 DIAGNOSIS — Z5111 Encounter for antineoplastic chemotherapy: Secondary | ICD-10-CM | POA: Diagnosis not present

## 2018-05-20 DIAGNOSIS — E1129 Type 2 diabetes mellitus with other diabetic kidney complication: Secondary | ICD-10-CM

## 2018-05-20 DIAGNOSIS — K911 Postgastric surgery syndromes: Secondary | ICD-10-CM | POA: Diagnosis not present

## 2018-05-20 DIAGNOSIS — J45909 Unspecified asthma, uncomplicated: Secondary | ICD-10-CM | POA: Diagnosis present

## 2018-05-20 DIAGNOSIS — H9190 Unspecified hearing loss, unspecified ear: Secondary | ICD-10-CM | POA: Diagnosis present

## 2018-05-20 DIAGNOSIS — E119 Type 2 diabetes mellitus without complications: Secondary | ICD-10-CM | POA: Diagnosis present

## 2018-05-20 DIAGNOSIS — K2951 Unspecified chronic gastritis with bleeding: Secondary | ICD-10-CM | POA: Diagnosis not present

## 2018-05-20 DIAGNOSIS — Z720 Tobacco use: Secondary | ICD-10-CM | POA: Diagnosis present

## 2018-05-20 DIAGNOSIS — K5909 Other constipation: Secondary | ICD-10-CM

## 2018-05-20 DIAGNOSIS — K056 Periodontal disease, unspecified: Secondary | ICD-10-CM | POA: Diagnosis not present

## 2018-05-20 DIAGNOSIS — R918 Other nonspecific abnormal finding of lung field: Secondary | ICD-10-CM | POA: Diagnosis not present

## 2018-05-20 LAB — URINALYSIS, COMPLETE (UACMP) WITH MICROSCOPIC
BACTERIA UA: NONE SEEN
Bilirubin Urine: NEGATIVE
Glucose, UA: NEGATIVE mg/dL
Hgb urine dipstick: NEGATIVE
Ketones, ur: NEGATIVE mg/dL
Leukocytes, UA: NEGATIVE
Nitrite: NEGATIVE
Protein, ur: 100 mg/dL — AB
Specific Gravity, Urine: 1.016 (ref 1.005–1.030)
pH: 5 (ref 5.0–8.0)

## 2018-05-20 LAB — CMP (CANCER CENTER ONLY)
ALT: 8 U/L (ref 0–44)
ANION GAP: 10 (ref 5–15)
AST: 9 U/L — ABNORMAL LOW (ref 15–41)
Albumin: 2.8 g/dL — ABNORMAL LOW (ref 3.5–5.0)
Alkaline Phosphatase: 79 U/L (ref 38–126)
BUN: 25 mg/dL — ABNORMAL HIGH (ref 8–23)
CHLORIDE: 100 mmol/L (ref 98–111)
CO2: 25 mmol/L (ref 22–32)
Calcium: 10.2 mg/dL (ref 8.9–10.3)
Creatinine: 1.3 mg/dL — ABNORMAL HIGH (ref 0.61–1.24)
GFR, EST NON AFRICAN AMERICAN: 56 mL/min — AB (ref >60)
Glucose, Bld: 126 mg/dL — ABNORMAL HIGH (ref 70–99)
Potassium: 4.7 mmol/L (ref 3.5–5.1)
SODIUM: 135 mmol/L (ref 135–145)
Total Bilirubin: 0.4 mg/dL (ref 0.3–1.2)
Total Protein: 8.9 g/dL — ABNORMAL HIGH (ref 6.5–8.1)

## 2018-05-20 LAB — CBC WITH DIFFERENTIAL (CANCER CENTER ONLY)
BASOS PCT: 2 %
Basophils Absolute: 0 10*3/uL (ref 0.0–0.1)
EOS ABS: 0 10*3/uL (ref 0.0–0.5)
EOS PCT: 0 %
HCT: 22.8 % — ABNORMAL LOW (ref 38.4–49.9)
Hemoglobin: 7.6 g/dL — ABNORMAL LOW (ref 13.0–17.1)
Lymphocytes Relative: 59 %
Lymphs Abs: 1.4 10*3/uL (ref 0.9–3.3)
MCH: 29 pg (ref 27.2–33.4)
MCHC: 33.3 g/dL (ref 32.0–36.0)
MCV: 87 fL (ref 79.3–98.0)
MONO ABS: 0.8 10*3/uL (ref 0.1–0.9)
MONOS PCT: 32 %
NEUTROS PCT: 7 %
Neutro Abs: 0.2 10*3/uL — CL (ref 1.5–6.5)
PLATELETS: 18 10*3/uL — AB (ref 140–400)
RBC: 2.62 MIL/uL — ABNORMAL LOW (ref 4.20–5.82)
RDW: 15.9 % — AB (ref 11.0–14.6)
WBC Count: 2.4 10*3/uL — ABNORMAL LOW (ref 4.0–10.3)

## 2018-05-20 LAB — SAMPLE TO BLOOD BANK

## 2018-05-20 MED ORDER — METOPROLOL TARTRATE 25 MG PO TABS
25.0000 mg | ORAL_TABLET | Freq: Every day | ORAL | Status: DC
  Administered 2018-05-22: 25 mg via ORAL
  Filled 2018-05-20 (×2): qty 1

## 2018-05-20 MED ORDER — TRIAMTERENE-HCTZ 37.5-25 MG PO TABS
1.0000 | ORAL_TABLET | Freq: Every day | ORAL | Status: DC
  Filled 2018-05-20: qty 1

## 2018-05-20 MED ORDER — PANTOPRAZOLE SODIUM 40 MG PO TBEC
40.0000 mg | DELAYED_RELEASE_TABLET | Freq: Two times a day (BID) | ORAL | Status: DC
  Administered 2018-05-20 – 2018-06-03 (×28): 40 mg via ORAL
  Filled 2018-05-20 (×29): qty 1

## 2018-05-20 MED ORDER — PROCHLORPERAZINE MALEATE 10 MG PO TABS: 5.0000 mg | ORAL_TABLET | Freq: Four times a day (QID) | ORAL | Status: DC | PRN

## 2018-05-20 MED ORDER — AMLODIPINE BESYLATE 5 MG PO TABS
5.0000 mg | ORAL_TABLET | Freq: Every day | ORAL | Status: DC
  Administered 2018-05-22 – 2018-06-03 (×13): 5 mg via ORAL
  Filled 2018-05-20 (×14): qty 1

## 2018-05-20 MED ORDER — LATANOPROST 0.005 % OP SOLN
1.0000 [drp] | Freq: Every day | OPHTHALMIC | Status: DC
  Administered 2018-05-20 – 2018-06-02 (×13): 1 [drp] via OPHTHALMIC
  Filled 2018-05-20 (×2): qty 2.5

## 2018-05-20 MED ORDER — DORZOLAMIDE HCL-TIMOLOL MAL 2-0.5 % OP SOLN
1.0000 [drp] | Freq: Two times a day (BID) | OPHTHALMIC | Status: DC
  Administered 2018-05-20 – 2018-06-03 (×28): 1 [drp] via OPHTHALMIC
  Filled 2018-05-20 (×2): qty 10

## 2018-05-20 MED ORDER — SODIUM CHLORIDE 0.9 % IV SOLN
INTRAVENOUS | Status: DC
  Administered 2018-05-20: 13:00:00 via INTRAVENOUS
  Filled 2018-05-20 (×2): qty 250

## 2018-05-20 MED ORDER — BENAZEPRIL HCL 20 MG PO TABS
40.0000 mg | ORAL_TABLET | Freq: Every day | ORAL | Status: DC
  Filled 2018-05-20: qty 2

## 2018-05-20 MED ORDER — ALBUTEROL SULFATE (2.5 MG/3ML) 0.083% IN NEBU
3.0000 mL | INHALATION_SOLUTION | Freq: Four times a day (QID) | RESPIRATORY_TRACT | Status: DC | PRN
  Administered 2018-05-29: 3 mL via RESPIRATORY_TRACT
  Filled 2018-05-20: qty 3

## 2018-05-20 MED ORDER — SODIUM CHLORIDE 0.9 % IV SOLN
2.0000 g | Freq: Once | INTRAVENOUS | Status: AC
  Administered 2018-05-20: 2 g via INTRAVENOUS
  Filled 2018-05-20: qty 2

## 2018-05-20 MED ORDER — MONTELUKAST SODIUM 10 MG PO TABS
10.0000 mg | ORAL_TABLET | Freq: Every day | ORAL | Status: DC
  Administered 2018-05-20 – 2018-06-02 (×14): 10 mg via ORAL
  Filled 2018-05-20 (×15): qty 1

## 2018-05-20 MED ORDER — SODIUM CHLORIDE 0.9 % IV SOLN
INTRAVENOUS | Status: DC
  Administered 2018-05-20 – 2018-06-03 (×11): via INTRAVENOUS

## 2018-05-20 MED ORDER — SODIUM CHLORIDE 0.9 % IV SOLN
2.0000 g | Freq: Three times a day (TID) | INTRAVENOUS | Status: DC
  Administered 2018-05-20 – 2018-05-21 (×3): 2 g via INTRAVENOUS
  Filled 2018-05-20 (×5): qty 2

## 2018-05-20 NOTE — Progress Notes (Signed)
Pt transferred via wheel chair to [facility] room 1509. Report given to Raquel Sarna, RN. MD made aware of transfer to floor.

## 2018-05-20 NOTE — Progress Notes (Signed)
  [facility] OFFICE PROGRESS NOTE   Diagnosis: Myelodysplasia  INTERVAL HISTORY:   Andrew [name] returns as scheduled.  He completed cycle one 5-azacytidine beginning 05/06/2018.  He denies nausea/vomiting.  No mouth sores.  He thinks a previous mouth sore has nearly healed.  No diarrhea.  Poor appetite.  No cough or shortness of breath.  No urinary symptoms.  He has had intermittent chills for the past 2 days.  He has a fever in the office today.  He has not checked his temperature at home.  He denies any bleeding.  Objective:  Vital signs in last 24 hours:  Blood pressure 127/61, pulse 90, temperature (!) 101.4 F (38.6 C), resp. rate 18, height '5\' 7"'$  (1.702 m), weight 166 lb 4.8 oz (75.4 kg), SpO2 100 %.    HEENT: Small ulceration right upper anterior gumline.  Erythema/ulceration right lower anterior gumline. Resp: Lungs clear bilaterally. Cardio: Regular rate and rhythm. GI: Abdomen soft and nontender.  No hepatosplenomegaly. Vascular: No leg edema. Neuro: Alert and oriented. Skin: Scattered areas of hyperpigmentation/dryness over the anterior abdominal wall.   Lab Results:  Lab Results  Component Value Date   WBC 2.4 (L) 05/20/2018   HGB 7.6 (L) 05/20/2018   HCT 22.8 (L) 05/20/2018   MCV 87.0 05/20/2018   PLT 18 (L) 05/20/2018   NEUTROABS 0.2 (LL) 05/20/2018    Imaging:  No results found.  Medications: I have reviewed the patient's current medications.  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  Cycle 1  5- azacytidine 05/06/2018 2.Renal insufficiency 3.Diabetes 4.Hemoccult positive stool  Upper endoscopy 04/03/2018-chronic  gastritis, duodenitis  Colonoscopy 04/04/2018-diverticulosis in the sigmoid colon 5.Right lower lobe 7 mm subpleural nodule on chest CT 04/02/2018 6.  Hospitalization with febrile neutropenia 05/20/2018  Disposition: Andrew [name] completed cycle one 5-azacytidine beginning 05/06/2018.  He presents today for routine follow-up.  He is febrile.  He has persistent severe neutropenia.  We are making arrangements for hospitalization.  We will obtain a urine culture, blood cultures and begin IV antibiotics with a dose of cefepime while he is in the office.  Patient seen with Dr. [name].  [name] ANP/GNP-BC   05/20/2018  10:17 AM

## 2018-05-20 NOTE — H&P (Addendum)
Admission History and Physical      Chief Complaint: Fever, chills HPI: [name] is a 67 year old man recently diagnosed with MDS.  He completed cycle one 5-azacytidine beginning 05/06/2018.  He presents to the office today for routine follow-up.  He reports having chills intermittently over the past 2 days.  Temperature in our office 101.4.  He has not checked his temperature at home.  He denies cough and shortness of breath.  No urinary symptoms.  Previous mouth sores are better.  He denies any bleeding.  No nausea or vomiting.  No diarrhea.    Past Medical History:  Diagnosis Date  . Abdominal bloating    probable lactose intolorence  . Anemia 04/03/2018  . Asthma   . COPD (chronic obstructive pulmonary disease) ([facility])    mild improvement  . DM (diabetes mellitus) ([facility])   . Hearing impairment    asymptomatic  . HTN (hypertension)   . Tobacco use   . Vitamin D deficiency     Past Surgical History:  Procedure Laterality Date  . BIOPSY  04/03/2018   Procedure: BIOPSY;  Surgeon: Otis Brace, MD;  Location: [facility];  Service: Gastroenterology;;  . COLONOSCOPY WITH PROPOFOL N/A 04/04/2018   Procedure: COLONOSCOPY WITH PROPOFOL;  Surgeon: Otis Brace, MD;  Location: [facility];  Service: Gastroenterology;  Laterality: N/A;  . ESOPHAGOGASTRODUODENOSCOPY (EGD) WITH PROPOFOL N/A 04/03/2018   Procedure: ESOPHAGOGASTRODUODENOSCOPY (EGD) WITH PROPOFOL;  Surgeon: Otis Brace, MD;  Location: [facility];  Service: Gastroenterology;  Laterality: N/A;  . EYE SURGERY      Medications Prior to Admission  Medication Sig Dispense Refill  . amLODipine (NORVASC) 10 MG tablet Take 5 mg by mouth daily.     . benazepril (LOTENSIN) 40 MG tablet Take 40 mg by mouth daily.  6  . cholecalciferol (VITAMIN D) 1000 UNITS tablet Take 1,000 Units by mouth daily.    . dorzolamide-timolol (COSOPT) 22.3-6.8 MG/ML ophthalmic solution Place 1 drop into both eyes 2 (two) times daily.  3  .  gemfibrozil (LOPID) 600 MG tablet Take 600 mg by mouth 2 (two) times daily.  6  . metoprolol (LOPRESSOR) 50 MG tablet Take 25 mg by mouth daily.     . montelukast (SINGULAIR) 10 MG tablet TK 1 T PO D  1  . pantoprazole (PROTONIX) 40 MG tablet Take 1 tablet (40 mg total) by mouth 2 (two) times daily. 60 tablet 0  . prochlorperazine (COMPAZINE) 5 MG tablet Take 1 tablet (5 mg total) by mouth every 6 (six) hours as needed for nausea or vomiting. 30 tablet 1  . tetrahydrozoline-zinc (VISINE-AC) 0.05-0.25 % ophthalmic solution 2 drops as needed.    . TRAVATAN Z 0.004 % SOLN ophthalmic solution Place 1 drop into both eyes every evening.  4  . triamterene-hydrochlorothiazide (MAXZIDE-25) 37.5-25 MG per tablet Take 1 tablet by mouth daily.    . VENTOLIN HFA 108 (90 Base) MCG/ACT inhaler Inhale 1-2 puffs into the lungs 4 (four) times daily as needed.  3   Scheduled Meds: Continuous Infusions: PRN Meds:.  Allergies  Allergen Reactions  . Shellfish Allergy     " I BREAK OUT IN HIVES "    No family history on file.   reports that he quit smoking about 2 months ago. His smoking use included cigarettes. He smoked 0.25 packs per day. He has never used smokeless tobacco. He reports that he drinks alcohol. He reports that he does not use drugs.  ROS: Chills intermittently over the past  few days.  No fever at home.  Diminished appetite.  No shortness of breath or cough.  No bleeding.  He denies shortness of breath and cough.  No urinary symptoms.  No nausea or vomiting.  No diarrhea. Previous mouth sores are better.  Physical:  There were no vitals taken for this visit.  General: Male in no acute distress. HEENT: Small ulceration right upper anterior gumline.  Erythema/ulceration right lower anterior gumline with associated tenderness. Chest: Few scattered wheezes.  No respiratory distress. Cardiovascular: Regular rate and rhythm. Abdomen: Abdomen soft and nontender.  No  hepatosplenomegaly. Extremities: No leg edema. Neuro: Alert and oriented. Skin: Scattered areas of hyperpigmentation/dryness over the anterior abdominal wall.  Labs:  Results for orders placed or performed in visit on 05/20/18 (from the past 48 hour(s))  CBC with Differential ([facility] Only)     Status: Abnormal   Collection Time: 05/20/18  9:04 AM  Result Value Ref Range   WBC Count 2.4 (L) 4.0 - 10.3 K/uL   RBC 2.62 (L) 4.20 - 5.82 MIL/uL   Hemoglobin 7.6 (L) 13.0 - 17.1 g/dL   HCT 22.8 (L) 38.4 - 49.9 %   MCV 87.0 79.3 - 98.0 fL   MCH 29.0 27.2 - 33.4 pg   MCHC 33.3 32.0 - 36.0 g/dL   RDW 15.9 (H) 11.0 - 14.6 %   Platelet Count 18 (L) 140 - 400 K/uL   Neutrophils Relative % 7 %   Neutro Abs 0.2 (LL) 1.5 - 6.5 K/uL    Comment: CRITICAL RESULT CALLED TO, READ BACK BY AND VERIFIED WITH: MELIA WILKES RN'@0940'$  L GALLOWAY MT     Lymphocytes Relative 59 %   Lymphs Abs 1.4 0.9 - 3.3 K/uL   Monocytes Relative 32 %   Monocytes Absolute 0.8 0.1 - 0.9 K/uL   Eosinophils Relative 0 %   Eosinophils Absolute 0.0 0.0 - 0.5 K/uL   Basophils Relative 2 %   Basophils Absolute 0.0 0.0 - 0.1 K/uL   Smear Review few myelo,atyp and smdg seen     Comment: amended report. notified Melia at 1034. Performed at [facility], [place] [address], [place], [place] [address] CORRECTED ON 08/27 AT 3785: PREVIOUSLY REPORTED AS few myelo,atyp and smdg seen   CMP ([facility] only)     Status: Abnormal   Collection Time: 05/20/18  9:04 AM  Result Value Ref Range   Sodium 135 135 - 145 mmol/L   Potassium 4.7 3.5 - 5.1 mmol/L   Chloride 100 98 - 111 mmol/L   CO2 25 22 - 32 mmol/L   Glucose, Bld 126 (H) 70 - 99 mg/dL   BUN 25 (H) 8 - 23 mg/dL   Creatinine 1.30 (H) 0.61 - 1.24 mg/dL   Calcium 10.2 8.9 - 10.3 mg/dL   Total Protein 8.9 (H) 6.5 - 8.1 g/dL   Albumin 2.8 (L) 3.5 - 5.0 g/dL   AST 9 (L) 15 - 41 U/L   ALT 8 0 - 44 U/L   Alkaline Phosphatase 79 38 - 126 U/L   Total  Bilirubin 0.4 0.3 - 1.2 mg/dL   GFR, Est Non Af Am 56 (L) >60 mL/min   GFR, Est AFR Am >60 >60 mL/min    Comment: (NOTE) The eGFR has been calculated using the CKD EPI equation. This calculation has not been validated in all clinical situations. eGFR's persistently <60 mL/min signify possible Chronic Kidney Disease.    Anion gap 10 5 - 15  Comment: Performed at [facility], [place] [address], [place], [place] [address]  Sample to Blood Bank     Status: None   Collection Time: 05/20/18  9:05 AM  Result Value Ref Range   Blood Bank Specimen SAMPLE AVAILABLE FOR TESTING    Sample Expiration      05/23/2018 Performed at [facility], [place] [address] [place], [place] [address]    No results found.  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  Cycle 1  5- azacytidine 05/06/2018 2.Renal insufficiency 3.Diabetes 4.Hemoccult positive stool  Upper endoscopy 04/03/2018-chronic gastritis, duodenitis  Colonoscopy 04/04/2018-diverticulosis in the sigmoid colon 5.Right lower lobe 7 mm subpleural nodule on chest CT 04/02/2018 6.  Febrile neutropenia 05/20/2018  Plan: Andrew [name] is a 67 year old man with recently diagnosed MDS.  He completed cycle one 5-azacytidine beginning 05/06/2018.  He presents today for routine follow-up and is found to be febrile in the setting of severe neutropenia.  He will be admitted to the hospital for further evaluation/IV antibiotics.  We have obtained blood cultures and urine cultures.  He received the first dose of cefepime at the [facility].  Plan for chest x-ray upon admission.  Repeat CBC in a.m.   [name] ANP/GNP-BC 05/20/2018, 1:36 PM   Mr. Rakestraw was interviewed and examined.  He will be admitted for evaluation and management of febrile neutropenia.  The most likely source for infection is dental given the recent gum ulcers and bleeding.  No other apparent source for infection.  The mouth bleeding appears significantly improved compared to when I saw him last week.  The platelet count has stabilized following transfusion last week.  We will check a CBC on 05/21/2018 and consider adding G-CSF support.

## 2018-05-20 NOTE — Progress Notes (Signed)
Pharmacy Antibiotic Note  Andrew [name] is a 67 y.o. male admitted on 05/20/2018 with febrile neutropenia.  Pharmacy has been consulted for cefepime dosing. Cefepime 2 gm given at [facility] at 1240, WBC 2.4, ANC 0.2, SCr 1.3, CrCl 53 ml/min Temp 101.4  Plan:   Cefepime 2 gm x 1 given at cancer center then cefepime 2 gm IV q8h  F/u renal function, WBC, temp, culture data  Temp (24hrs), Avg:101.4 F (38.6 C), Min:101.4 F (38.6 C), Max:101.4 F (38.6 C)  Recent Labs  Lab 05/15/18 0746 05/20/18 0904  WBC 1.3* 2.4*  CREATININE  --  1.30*    Estimated Creatinine Clearance: 53 mL/min (A) (by C-G formula based on SCr of 1.3 mg/dL (H)).    Allergies  Allergen Reactions  . Shellfish Allergy     " I BREAK OUT IN HIVES "    Antimicrobials this admission: 8/27 cefepime>> Dose adjustments this admission:  Microbiology results: 8/27 BCx2>> 8/27 Ucx>>  Thank you for allowing pharmacy to be a part of this patient's care.  Eudelia Bunch, Pharm.D [phone] 05/20/2018 1:48 PM

## 2018-05-21 ENCOUNTER — Other Ambulatory Visit: Payer: Self-pay | Admitting: Radiology

## 2018-05-21 LAB — CBC
HCT: 19.2 % — ABNORMAL LOW (ref 39.0–52.0)
Hemoglobin: 6.6 g/dL — CL (ref 13.0–17.0)
MCH: 29.5 pg (ref 26.0–34.0)
MCHC: 34.4 g/dL (ref 30.0–36.0)
MCV: 85.7 fL (ref 78.0–100.0)
PLATELETS: 10 10*3/uL — AB (ref 150–400)
RBC: 2.24 MIL/uL — ABNORMAL LOW (ref 4.22–5.81)
RDW: 15.9 % — ABNORMAL HIGH (ref 11.5–15.5)
WBC: 2.1 10*3/uL — ABNORMAL LOW (ref 4.0–10.5)

## 2018-05-21 LAB — DIFFERENTIAL
BASOS PCT: 1 %
Basophils Absolute: 0 10*3/uL (ref 0.0–0.1)
Eosinophils Absolute: 0 10*3/uL (ref 0.0–0.7)
Eosinophils Relative: 1 %
LYMPHS ABS: 0.8 10*3/uL (ref 0.7–4.0)
Lymphocytes Relative: 37 %
MONOS PCT: 58 %
Monocytes Absolute: 1.2 10*3/uL — ABNORMAL HIGH (ref 0.1–1.0)
NEUTROS ABS: 0.1 10*3/uL — AB (ref 1.7–7.7)
Neutrophils Relative %: 3 %

## 2018-05-21 LAB — URINE CULTURE: Culture: <10000 — AB

## 2018-05-21 LAB — PREPARE RBC (CROSSMATCH)

## 2018-05-21 LAB — ABO/RH: ABO/RH(D): O POS

## 2018-05-21 MED ORDER — ACETAMINOPHEN 325 MG PO TABS
650.0000 mg | ORAL_TABLET | ORAL | Status: DC | PRN
  Administered 2018-05-21 – 2018-06-01 (×19): 650 mg via ORAL
  Filled 2018-05-21 (×19): qty 2

## 2018-05-21 MED ORDER — SODIUM CHLORIDE 0.9 % IV SOLN
1.0000 g | Freq: Three times a day (TID) | INTRAVENOUS | Status: DC
  Administered 2018-05-21 – 2018-05-31 (×29): 1 g via INTRAVENOUS
  Filled 2018-05-21 (×31): qty 1

## 2018-05-21 MED ORDER — VANCOMYCIN HCL IN DEXTROSE 750-5 MG/150ML-% IV SOLN: 750.0000 mg | Freq: Two times a day (BID) | INTRAVENOUS | Status: DC

## 2018-05-21 MED ORDER — TBO-FILGRASTIM 300 MCG/0.5ML ~~LOC~~ SOSY
300.0000 ug | PREFILLED_SYRINGE | Freq: Every day | SUBCUTANEOUS | Status: DC
  Filled 2018-05-21: qty 0.5

## 2018-05-21 MED ORDER — SODIUM CHLORIDE 0.9% IV SOLUTION
Freq: Once | INTRAVENOUS | Status: AC
  Administered 2018-05-21: 16:00:00 via INTRAVENOUS

## 2018-05-21 MED ORDER — VANCOMYCIN HCL 10 G IV SOLR
1500.0000 mg | Freq: Once | INTRAVENOUS | Status: AC
  Administered 2018-05-21: 1500 mg via INTRAVENOUS
  Filled 2018-05-21: qty 1500

## 2018-05-21 MED ORDER — VANCOMYCIN HCL 10 G IV SOLR
1250.0000 mg | INTRAVENOUS | Status: DC
  Administered 2018-05-22 – 2018-05-26 (×5): 1250 mg via INTRAVENOUS
  Filled 2018-05-21 (×5): qty 1250

## 2018-05-21 NOTE — Care Management Note (Signed)
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  Cycle 1 5-azacytidine 05/06/2018 2.Renal insufficiency 3.Diabetes 4.Hemoccult positive stool  Upper endoscopy 04/03/2018-chronic gastritis, duodenitis  Colonoscopy 04/04/2018-diverticulosis in the sigmoid colon 5.Right lower lobe 7 mm subpleural nodule on chest CT 04/02/2018 6.Admission with febrile neutropenia 05/20/2018   Andrew [name] is now at day 16 following cycle 1 of 5 azacytidine for treatment of myelodysplasia.  He was admitted yesterday with febrile neutropenia.  There is no apparent source for infection, though he has poor dentition and this may be the infection source. Cultures of the blood and urine are pending.  He appears stable and the fever is down this morning.  We will follow-up on the CBC from today and transfuse platelets and packed red blood cells as needed Action/Plan: Following for progression and cm needs-no needs present at this time  Expected Discharge Date:  (unknown)               Expected Discharge Plan:  Home/Self Care  In-House Referral:     Discharge planning Services  CM Consult  Post Acute Care Choice:    Choice offered to:     DME Arranged:    DME Agency:     HH Arranged:    HH Agency:     Status of Service:  In process, will continue to follow  If discussed at Long Length of Stay Meetings, dates discussed:    Additional Comments:  Andrew Cha, RN 05/21/2018, 11:39 AM

## 2018-05-21 NOTE — Progress Notes (Signed)
IP PROGRESS NOTE  Subjective:   Andrew [name] has no complaint.  No further chills.  No bleeding.  Objective: Vital signs in last 24 hours: Blood pressure (!) 119/58, pulse 82, temperature 99 F (37.2 C), temperature source Oral, resp. rate 20, SpO2 98 %.  Intake/Output from previous day: 08/27 0701 - 08/28 0700 In: 1014.2 [P.O.:240; I.V.:664.1; IV Piggyback:110.1] Out: 750 [Urine:750]  Physical Exam:  HEENT: No thrush or bleeding.  Ulceration at the lower mid gumline.  Poor dentition. Lungs: Clear bilaterally Cardiac: Regular rate and rhythm Abdomen: Nontender, no hepatosplenomegaly Extremities: No leg edema   Lab Results: Recent Labs    05/20/18 0904  WBC 2.4*  HGB 7.6*  HCT 22.8*  PLT 18*    BMET Recent Labs    05/20/18 0904  NA 135  K 4.7  CL 100  CO2 25  GLUCOSE 126*  BUN 25*  CREATININE 1.30*  CALCIUM 10.2    No results found for: CEA1  Studies/Results: X-ray Chest Pa And Lateral  Result Date: 05/20/2018 CLINICAL DATA:  Fever this morning. EXAM: CHEST - 2 VIEW COMPARISON:  Chest CT April 02, 2018 and chest radiograph April 02, 2018. FINDINGS: Cardiac silhouette is upper limits of normal size. Calcified aortic arch. Mild bronchitic changes without pleural effusion or focal consolidation. Mild hyperinflation with flattened hemidiaphragms. No pneumothorax. Biapical pleural thickening. Soft tissue planes and included osseous structures are non suspicious. IMPRESSION: 1. Mild bronchitic changes and mild hyperinflation without focal consolidation. 2. Borderline cardiomegaly. 3.  Aortic Atherosclerosis (ICD10-I70.0). Electronically Signed   By: Elon Alas M.D.   On: 05/20/2018 14:29    Medications: I have reviewed the patient's current medications.  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  Cycle 1 5-azacytidine 05/06/2018 2.Renal insufficiency 3.Diabetes 4.Hemoccult positive stool  Upper endoscopy 04/03/2018-chronic gastritis, duodenitis  Colonoscopy 04/04/2018-diverticulosis in the sigmoid colon 5.Right lower lobe 7 mm subpleural nodule on chest CT 04/02/2018 6.  Admission with febrile neutropenia 05/20/2018   Andrew [name] is now at day 16 following cycle 1 of 5 azacytidine for treatment of myelodysplasia.  He was admitted yesterday with febrile neutropenia.  There is no apparent source for infection, though he has poor dentition and this may be the infection source. Cultures of the blood and urine are pending.  He appears stable and the fever is down this morning.  We will follow-up on the CBC from today and transfuse platelets and packed red blood cells as needed.  I will add G-CSF if the neutrophil count remains low.  LOS: 1 day   Betsy Coder, MD   05/21/2018, 6:51 AM

## 2018-05-21 NOTE — Progress Notes (Signed)
Pharmacy Antibiotic Note  Andrew [name] is a 67 y.o. male admitted on 05/20/2018 with febrile neutropenia.  Pharmacy has been consulted for vancomycin and meropenem dosing.   05/21/2018 abx changing from cefepime to vanc/meropenem w/ temp spike to 103 and neutropenia.  Plan: Meropenem 1 gm IV q8h vancomcycin 1500 mg IV loading dose followed by vancomycin 1250 mg IV q24 for AUC 527 (SCr 1.3, IBW/TBW) F/u renal fxn, WBC, temp, culture data Vancomycin levels as needed    Temp (24hrs), Avg:101 F (38.3 C), Min:98.9 F (37.2 C), Max:103 F (39.4 C)  Recent Labs  Lab 05/15/18 0746 05/20/18 0904 05/21/18 0557  WBC 1.3* 2.4* 2.1*  CREATININE  --  1.30*  --     Estimated Creatinine Clearance: 53 mL/min (A) (by C-G formula based on SCr of 1.3 mg/dL (H)).    Allergies  Allergen Reactions  . Shellfish Allergy     " I BREAK OUT IN HIVES "   Antimicrobials this admission: 8/27 cefepime>>8/28 8/28 vanco>> 8/28 meropenem>> Dose adjustments this admission:  Microbiology results: 8/27 BCx: ngtd 8/27 UCx: <10k F  Thank you for allowing pharmacy to be a part of this patient's care. Eudelia Bunch, Pharm.D [phone] 05/21/2018 7:50 PM

## 2018-05-21 NOTE — Progress Notes (Signed)
Patient noted to have fever prior to hanging blood, see flowsheet.  MD informed, gave order for Tylenol and blood to be given.  Fever reassessed and MD informed.  Will continue to monitor. Virginia Rochester, RN

## 2018-05-22 ENCOUNTER — Inpatient Hospital Stay (HOSPITAL_COMMUNITY): Payer: Medicare Other

## 2018-05-22 LAB — CBC WITH DIFFERENTIAL/PLATELET
BASOS PCT: 2 %
Basophils Absolute: 0 10*3/uL (ref 0.0–0.1)
EOS ABS: 0 10*3/uL (ref 0.0–0.7)
EOS PCT: 2 %
HCT: 23.2 % — ABNORMAL LOW (ref 39.0–52.0)
Hemoglobin: 8 g/dL — ABNORMAL LOW (ref 13.0–17.0)
LYMPHS ABS: 0.8 10*3/uL (ref 0.7–4.0)
Lymphocytes Relative: 43 %
MCH: 29.2 pg (ref 26.0–34.0)
MCHC: 34.5 g/dL (ref 30.0–36.0)
MCV: 84.7 fL (ref 78.0–100.0)
MONO ABS: 1 10*3/uL (ref 0.1–1.0)
MONOS PCT: 51 %
NEUTROS PCT: 2 %
Neutro Abs: 0 10*3/uL — ABNORMAL LOW (ref 1.7–7.7)
RBC: 2.74 MIL/uL — ABNORMAL LOW (ref 4.22–5.81)
RDW: 15.1 % (ref 11.5–15.5)
WBC: 1.9 10*3/uL — ABNORMAL LOW (ref 4.0–10.5)

## 2018-05-22 LAB — COMPREHENSIVE METABOLIC PANEL
ALT: 10 U/L (ref 0–44)
ANION GAP: 11 (ref 5–15)
AST: 12 U/L — ABNORMAL LOW (ref 15–41)
Albumin: 2.9 g/dL — ABNORMAL LOW (ref 3.5–5.0)
Alkaline Phosphatase: 67 U/L (ref 38–126)
BUN: 14 mg/dL (ref 8–23)
CHLORIDE: 104 mmol/L (ref 98–111)
CO2: 23 mmol/L (ref 22–32)
CREATININE: 0.86 mg/dL (ref 0.61–1.24)
Calcium: 9.5 mg/dL (ref 8.9–10.3)
Glucose, Bld: 110 mg/dL — ABNORMAL HIGH (ref 70–99)
Potassium: 3.7 mmol/L (ref 3.5–5.1)
SODIUM: 138 mmol/L (ref 135–145)
Total Bilirubin: 0.9 mg/dL (ref 0.3–1.2)
Total Protein: 8.4 g/dL — ABNORMAL HIGH (ref 6.5–8.1)

## 2018-05-22 MED ORDER — SODIUM CHLORIDE 0.9% IV SOLUTION
Freq: Once | INTRAVENOUS | Status: AC
  Administered 2018-05-22: 13:00:00 via INTRAVENOUS

## 2018-05-22 MED ORDER — LIDOCAINE HCL 1 % IJ SOLN
INTRAMUSCULAR | Status: AC
  Filled 2018-05-22: qty 20

## 2018-05-22 MED ORDER — METOPROLOL TARTRATE 25 MG PO TABS
25.0000 mg | ORAL_TABLET | Freq: Two times a day (BID) | ORAL | Status: DC
  Administered 2018-05-22 – 2018-06-03 (×24): 25 mg via ORAL
  Filled 2018-05-22 (×24): qty 1

## 2018-05-22 MED ORDER — TBO-FILGRASTIM 300 MCG/0.5ML ~~LOC~~ SOSY
300.0000 ug | PREFILLED_SYRINGE | Freq: Every day | SUBCUTANEOUS | Status: DC
  Administered 2018-05-22 – 2018-05-25 (×4): 300 ug via SUBCUTANEOUS
  Filled 2018-05-22 (×5): qty 0.5

## 2018-05-22 NOTE — Progress Notes (Signed)
   05/22/18 1559  MEWS Score  Temp (!) 102.6 F (39.2 C)   MD informed.  Antipyretic given.  Will continue to monitor for further interventions. Virginia Rochester, RN   Virginia Rochester, RN 05/22/2018, 6:00 PM

## 2018-05-22 NOTE — Procedures (Signed)
RUE PICC SVC RA EBL 0 Comp 0  

## 2018-05-22 NOTE — Care Management Note (Signed)
Case Management Note  Patient Details  Name: IDRISS QUACKENBUSH MRN: [id number] Date of Birth: 07-01-1951  Subjective/Objective:      Progression of symptoms from chart:   Mr. Avey appears unchanged.  No complaint.  No bleeding.  He had a persistent high fever yesterday.  Antibiotics were changed yesterday evening.  Objective: Vital signs in last 24 hours: Blood pressure 135/68, pulse 85, temperature 99.5 F (37.5 C), temperature source Oral, resp. rate 20, SpO2 100 %.  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  Cycle 1 5-azacytidine 05/06/2018 2.Renal insufficiency 3.Diabetes 4.Hemoccult positive stool  Upper endoscopy 04/03/2018-chronic gastritis, duodenitis  Colonoscopy 04/04/2018-diverticulosis in the sigmoid colon 5.Right lower lobe 7 mm subpleural nodule on chest CT 04/02/2018 6.Admission with febrile neutropenia 05/20/2018- placed on cefepime, antibiotics changed to vancomycin/meropenem on 05/21/2018   Mr. Ahr is now at day 17 following cycle 1 of 5 azacytidine for treatment of myelodysplasia.  He is admitted with febrile neutropenia.  He had a persistent high fever yesterday.  I switched antibiotics to vancomycin and meropenem yesterday evening.  He has a low-grade fever this morning.  Cultures are negative to date.  No apparent source for infection.  He has poor dentition, but has no dental complaints.  He was transfused with 1 unit of packed red blood cells yesterday.  We will follow-up the CBC from this  morning and transfuse red cells and platelets as indicated.  He started G-CSF yesterday.  This will continue.  Plan: 1.  Continue IV antibiotics, follow-up cultures 2.  Dental medicine consult 3.  Follow-up CBC and transfuse as indicated 4.  Place PICC  Action/Plan: following progression of care as above Following for cm needs-home iv abx?  Expected Discharge Date:  (unknown)               Expected Discharge Plan:  Home/Self Care  In-House Referral:     Discharge planning Services  CM Consult  Post Acute Care Choice:    Choice offered to:     DME Arranged:    DME Agency:     HH Arranged:    HH Agency:     Status of Service:  In process, will continue to follow  If discussed at Long Length of Stay Meetings, dates discussed:    Additional Comments:  Leeroy Cha, RN 05/22/2018, 11:43 AM

## 2018-05-22 NOTE — Progress Notes (Signed)
IP PROGRESS NOTE  Subjective:   [name] appears unchanged.  No complaint.  No bleeding.  He had a persistent high fever yesterday.  Antibiotics were changed yesterday evening.  Objective: Vital signs in last 24 hours: Blood pressure 135/68, pulse 85, temperature 99.5 F (37.5 C), temperature source Oral, resp. rate 20, SpO2 100 %.  Intake/Output from previous day: 08/28 0701 - 08/29 0700 In: 2023.6 [I.V.:310.3; Blood:320; IV Piggyback:1393.3] Out: 900 [Urine:900]  Physical Exam:  HEENT: No thrush, mild oozing at the lower anterior gumline, multiple gum ulcers, poor dentition Lungs: Mild bilateral expiratory wheeze, no respiratory distress Cardiac: Regular rate and rhythm Abdomen: Nontender, no hepatosplenomegaly Extremities: No leg edema   Lab Results: Recent Labs    05/20/18 0904 05/21/18 0557  WBC 2.4* 2.1*  HGB 7.6* 6.6*  HCT 22.8* 19.2*  PLT 18* 10*    BMET Recent Labs    05/20/18 0904 05/22/18 0600  NA 135 138  K 4.7 3.7  CL 100 104  CO2 25 23  GLUCOSE 126* 110*  BUN 25* 14  CREATININE 1.30* 0.86  CALCIUM 10.2 9.5    No results found for: CEA1  Studies/Results: X-ray Chest Pa And Lateral  Result Date: 05/20/2018 CLINICAL DATA:  Fever this morning. EXAM: CHEST - 2 VIEW COMPARISON:  Chest CT April 02, 2018 and chest radiograph April 02, 2018. FINDINGS: Cardiac silhouette is upper limits of normal size. Calcified aortic arch. Mild bronchitic changes without pleural effusion or focal consolidation. Mild hyperinflation with flattened hemidiaphragms. No pneumothorax. Biapical pleural thickening. Soft tissue planes and included osseous structures are non suspicious. IMPRESSION: 1. Mild bronchitic changes and mild hyperinflation without focal consolidation. 2. Borderline cardiomegaly. 3.  Aortic Atherosclerosis (ICD10-I70.0). Electronically Signed   By: Elon Alas M.D.   On: 05/20/2018 14:29    Medications: I have reviewed the patient's current  medications.  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  Cycle 1 5-azacytidine 05/06/2018 2.Renal insufficiency 3.Diabetes 4.Hemoccult positive stool  Upper endoscopy 04/03/2018-chronic gastritis, duodenitis  Colonoscopy 04/04/2018-diverticulosis in the sigmoid colon 5.Right lower lobe 7 mm subpleural nodule on chest CT 04/02/2018 6.  Admission with febrile neutropenia 05/20/2018- placed on cefepime, antibiotics changed to vancomycin/meropenem on 05/21/2018   [name] is now at day 17 following cycle 1 of 5 azacytidine for treatment of myelodysplasia.  He is admitted with febrile neutropenia.  He had a persistent high fever yesterday.  I switched antibiotics to vancomycin and meropenem yesterday evening.  He has a low-grade fever this morning.  Cultures are negative to date.  No apparent source for infection.  He has poor dentition, but has no dental complaints.  He was transfused with 1 unit of packed red blood cells yesterday.  We will follow-up the CBC from this morning and transfuse red cells and platelets as indicated.  He started G-CSF yesterday.  This will continue.  Plan: 1.  Continue IV antibiotics, follow-up cultures 2.  Dental medicine consult 3.  Follow-up CBC and transfuse as indicated 4.  Place PICC  LOS: 2 days   Betsy Coder, MD   05/22/2018, 7:32 AM

## 2018-05-23 ENCOUNTER — Ambulatory Visit (HOSPITAL_COMMUNITY): Payer: Medicare Other

## 2018-05-23 ENCOUNTER — Other Ambulatory Visit (HOSPITAL_COMMUNITY): Payer: Medicare Other

## 2018-05-23 LAB — CBC WITH DIFFERENTIAL/PLATELET
BASOS ABS: 0 10*3/uL (ref 0.0–0.1)
Basophils Relative: 1 %
Eosinophils Absolute: 0 10*3/uL (ref 0.0–0.7)
Eosinophils Relative: 1 %
HEMATOCRIT: 20.8 % — AB (ref 39.0–52.0)
HEMOGLOBIN: 7.2 g/dL — AB (ref 13.0–17.0)
LYMPHS PCT: 53 %
Lymphs Abs: 1.1 10*3/uL (ref 0.7–4.0)
MCH: 29.6 pg (ref 26.0–34.0)
MCHC: 34.6 g/dL (ref 30.0–36.0)
MCV: 85.6 fL (ref 78.0–100.0)
MONO ABS: 0.6 10*3/uL (ref 0.1–1.0)
MONOS PCT: 31 %
NEUTROS ABS: 0.3 10*3/uL — AB (ref 1.7–7.7)
NEUTROS PCT: 14 %
Platelets: 6 10*3/uL — CL (ref 150–400)
RBC: 2.43 MIL/uL — ABNORMAL LOW (ref 4.22–5.81)
RDW: 15.1 % (ref 11.5–15.5)
WBC: 1.9 10*3/uL — ABNORMAL LOW (ref 4.0–10.5)

## 2018-05-23 LAB — PREPARE PLATELET PHERESIS: UNIT DIVISION: 0

## 2018-05-23 LAB — BPAM PLATELET PHERESIS
Blood Product Expiration Date: 201908302359
ISSUE DATE / TIME: 201908291252
Unit Type and Rh: 6200

## 2018-05-23 LAB — PLATELET COUNT: PLATELETS: 12 10*3/uL — AB (ref 150–400)

## 2018-05-23 MED ORDER — SODIUM CHLORIDE 0.9% IV SOLUTION: Freq: Once | INTRAVENOUS | Status: DC

## 2018-05-23 MED ORDER — SODIUM CHLORIDE 0.9% FLUSH
10.0000 mL | INTRAVENOUS | Status: DC | PRN
  Administered 2018-05-29 – 2018-06-03 (×3): 10 mL
  Filled 2018-05-23 (×3): qty 40

## 2018-05-23 NOTE — Progress Notes (Signed)
IP PROGRESS NOTE  Subjective:   Andrew [name] appears stable.  He had a high fever yesterday afternoon.  No bleeding.  He underwent PICC placement yesterday.  Objective: Vital signs in last 24 hours: Blood pressure 123/62, pulse 79, temperature 99 F (37.2 C), temperature source Oral, resp. rate 18, SpO2 100 %.  Intake/Output from previous day: 08/29 0701 - 08/30 0700 In: 2595.2 [P.O.:120; I.V.:486.3; IV Piggyback:1715.9] Out: 375 [Urine:375]  Physical Exam:  HEENT: No thrush, induration and ulceration at several areas of the lower and upper gumline.  No tenderness when palpated with a tongue blade. Lungs: Mild bilateral expiratory wheeze, no respiratory distress Cardiac: Regular rate and rhythm Abdomen: Nontender, no hepatosplenomegaly Extremities: No leg edema  Right PICC  Lab Results: Recent Labs    05/22/18 0600 05/23/18 0530  WBC 1.9* 1.9*  HGB 8.0* 7.2*  HCT 23.2* 20.8*  PLT <5* 6*   ANC 0.3 on 05/23/2018 BMET Recent Labs    05/22/18 0600  NA 138  K 3.7  CL 104  CO2 23  GLUCOSE 110*  BUN 14  CREATININE 0.86  CALCIUM 9.5    No results found for: CEA1  Studies/Results: Ir Picc Placement Right >5 Yrs Inc Img Guide  Result Date: 05/22/2018 INDICATION: Anemia. EXAM: RIGHT UPPER EXTREMITY PICC LINE PLACEMENT WITH ULTRASOUND AND FLUOROSCOPIC GUIDANCE MEDICATIONS: None ANESTHESIA/SEDATION: None FLUOROSCOPY TIME:  Fluoroscopy Time:  minutes 12 seconds (1 mGy). COMPLICATIONS: None immediate. PROCEDURE: The patient was advised of the possible risks and complications and agreed to undergo the procedure. The patient was then brought to the angiographic suite for the procedure. The right arm was prepped with chlorhexidine, draped in the usual sterile fashion using maximum barrier technique (cap and mask, sterile gown, sterile gloves, large sterile sheet, hand hygiene and cutaneous antiseptic). Local anesthesia was attained by infiltration with 1% lidocaine. Ultrasound  demonstrated patency of the basilic vein, and this was documented with an image. Under real-time ultrasound guidance, this vein was accessed with a 21 gauge micropuncture needle and image documentation was performed. The needle was exchanged over a guidewire for a peel-away sheath through which a 40 cm 5 [place] single lumen power injectable PICC was advanced, and positioned with its tip at the lower SVC/right atrial junction. Fluoroscopy during the procedure and fluoro spot radiograph confirms appropriate catheter position. The catheter was flushed, secured to the skin with Prolene sutures, and covered with a sterile dressing. IMPRESSION: Successful placement of a right arm PICC with sonographic and fluoroscopic guidance. The catheter is ready for use. Electronically Signed   By: Marybelle Killings M.D.   On: 05/22/2018 17:19    Medications: I have reviewed the patient's current medications.  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  Cycle 1 5-azacytidine 05/06/2018 2.Renal insufficiency 3.Diabetes 4.Hemoccult positive stool  Upper endoscopy 04/03/2018-chronic gastritis, duodenitis  Colonoscopy 04/04/2018-diverticulosis in the sigmoid colon 5.Right lower lobe 7 mm subpleural nodule on chest CT 04/02/2018 6.  Admission with febrile neutropenia 05/20/2018- placed on cefepime, antibiotics changed to vancomycin/meropenem on 05/21/2018 7.  Severe neutropenia- G-CSF started 05/22/2018   Andrew [name] is now at day 18 following cycle 1 of 5 azacytidine for treatment of myelodysplasia.  He is admitted with febrile neutropenia.    He had a high fever again yesterday, but the overall fever  curve appears improved.  He will continue vancomycin and meropenem.  There is no apparent source for infection.  Dental medicine has not evaluated him as of yet.  I will contact them today.    He was transfused platelets yesterday, but did not have a significant bump in the platelet count.  We will repeat a platelet transfusion today.  Andrew [name] will need to remain in the hospital until the fever resolves.   Plan: 1.  Continue IV antibiotics, follow-up cultures 2.  Transfuse platelets today   LOS: 3 days   Betsy Coder, MD   05/23/2018, 10:48 AM

## 2018-05-23 NOTE — Care Management Important Message (Signed)
Important Message  Patient Details  Name: Andrew [name] MRN: [id number] Date of Birth: 04-01-1951   Medicare Important Message Given:  Yes    Kerin Salen 05/23/2018, 1:10 PMImportant Message  Patient Details  Name: Andrew [name] MRN: [id number] Date of Birth: November 29, 1950   Medicare Important Message Given:  Yes    Kerin Salen 05/23/2018, 1:09 PM

## 2018-05-23 NOTE — Care Management Note (Signed)
Case Management Note  Patient Details  Name: Andrew [name] MRN: [id number] Date of Birth: 1951-01-03  Subjective/Objective:                  Subjective:   Andrew [name] appears stable.  He had a high fever yesterday afternoon.  No bleeding.  He underwent PICC placement yesterday.  Objective: Vital signs in last 24 hours: Blood pressure 123/62, pulse 79, temperature 102F (37.2 C), temperature source Oral, resp. rate 18, SpO2 100 %.  Intake/Output from previous day: 08/29 0701 - 08/30 0700 In: 2595.2 [P.O.:120; I.V.:486.3; IV Piggyback:1715.9] Out: 375 [Urine:375]  Physical Exam:  HEENT: No thrush, induration and ulceration at several areas of the lower and upper gumline.  No tenderness when palpated with a tongue blade. Lungs: Mild bilateral expiratory wheeze, no respiratory distress Cardiac: Regular rate and rhythm Abdomen: Nontender, no hepatosplenomegaly Extremities: No leg edema  Right PICC  Action/Plan:following for cm needs  Expected Discharge Date:  (unknown)               Expected Discharge Plan:  Home/Self Care  In-House Referral:     Discharge planning Services  CM Consult  Post Acute Care Choice:    Choice offered to:     DME Arranged:    DME Agency:     HH Arranged:    HH Agency:     Status of Service:  In process, will continue to follow  If discussed at Long Length of Stay Meetings, dates discussed:    Additional Comments:  Leeroy Cha, RN 05/23/2018, 10:56 AM

## 2018-05-24 ENCOUNTER — Other Ambulatory Visit: Payer: Self-pay | Admitting: Nurse Practitioner

## 2018-05-24 ENCOUNTER — Inpatient Hospital Stay (HOSPITAL_COMMUNITY): Payer: Medicare Other

## 2018-05-24 DIAGNOSIS — Z87891 Personal history of nicotine dependence: Secondary | ICD-10-CM

## 2018-05-24 DIAGNOSIS — K0889 Other specified disorders of teeth and supporting structures: Secondary | ICD-10-CM

## 2018-05-24 DIAGNOSIS — Z95828 Presence of other vascular implants and grafts: Secondary | ICD-10-CM

## 2018-05-24 DIAGNOSIS — R5081 Fever presenting with conditions classified elsewhere: Secondary | ICD-10-CM

## 2018-05-24 DIAGNOSIS — Z91013 Allergy to seafood: Secondary | ICD-10-CM

## 2018-05-24 DIAGNOSIS — I700 Atherosclerosis of aorta: Secondary | ICD-10-CM | POA: Diagnosis present

## 2018-05-24 DIAGNOSIS — D61818 Other pancytopenia: Secondary | ICD-10-CM

## 2018-05-24 DIAGNOSIS — N289 Disorder of kidney and ureter, unspecified: Secondary | ICD-10-CM

## 2018-05-24 DIAGNOSIS — K089 Disorder of teeth and supporting structures, unspecified: Secondary | ICD-10-CM

## 2018-05-24 DIAGNOSIS — K5900 Constipation, unspecified: Secondary | ICD-10-CM

## 2018-05-24 DIAGNOSIS — D469 Myelodysplastic syndrome, unspecified: Principal | ICD-10-CM

## 2018-05-24 LAB — CBC WITH DIFFERENTIAL/PLATELET
Basophils Absolute: 0 10*3/uL (ref 0.0–0.1)
Basophils Relative: 1 %
EOS PCT: 1 %
Eosinophils Absolute: 0 10*3/uL (ref 0.0–0.7)
HCT: 20 % — ABNORMAL LOW (ref 39.0–52.0)
Hemoglobin: 6.9 g/dL — CL (ref 13.0–17.0)
LYMPHS ABS: 0.6 10*3/uL — AB (ref 0.7–4.0)
Lymphocytes Relative: 36 %
MCH: 29.4 pg (ref 26.0–34.0)
MCHC: 34.5 g/dL (ref 30.0–36.0)
MCV: 85.1 fL (ref 78.0–100.0)
Monocytes Absolute: 1 10*3/uL (ref 0.1–1.0)
Monocytes Relative: 52 %
Neutro Abs: 0.2 10*3/uL — ABNORMAL LOW (ref 1.7–7.7)
Neutrophils Relative %: 10 %
PLATELETS: 9 10*3/uL — AB (ref 150–400)
RBC: 2.35 MIL/uL — AB (ref 4.22–5.81)
RDW: 15.2 % (ref 11.5–15.5)
WBC: 1.8 10*3/uL — AB (ref 4.0–10.5)

## 2018-05-24 LAB — CREATININE, SERUM
Creatinine, Ser: 0.83 mg/dL (ref 0.61–1.24)
GFR calc Af Amer: >60 mL/min (ref >60)
GFR calc non Af Amer: >60 mL/min (ref >60)

## 2018-05-24 LAB — PREPARE RBC (CROSSMATCH)

## 2018-05-24 MED ORDER — SENNOSIDES-DOCUSATE SODIUM 8.6-50 MG PO TABS
2.0000 | ORAL_TABLET | Freq: Two times a day (BID) | ORAL | Status: DC
  Administered 2018-05-24 – 2018-06-03 (×17): 2 via ORAL
  Filled 2018-05-24 (×22): qty 2

## 2018-05-24 MED ORDER — SODIUM CHLORIDE 0.9% IV SOLUTION
Freq: Once | INTRAVENOUS | Status: AC
  Administered 2018-05-24: 07:00:00 via INTRAVENOUS

## 2018-05-24 MED ORDER — SODIUM CHLORIDE 0.9 % IV SOLN
200.0000 mg | Freq: Once | INTRAVENOUS | Status: AC
  Administered 2018-05-24: 200 mg via INTRAVENOUS
  Filled 2018-05-24: qty 200

## 2018-05-24 MED ORDER — DIPHENHYDRAMINE HCL 25 MG PO CAPS
25.0000 mg | ORAL_CAPSULE | Freq: Once | ORAL | Status: AC
  Administered 2018-05-24: 25 mg via ORAL
  Filled 2018-05-24: qty 1

## 2018-05-24 MED ORDER — SODIUM CHLORIDE 0.9 % IV SOLN
100.0000 mg | INTRAVENOUS | Status: DC
  Administered 2018-05-25 – 2018-05-30 (×6): 100 mg via INTRAVENOUS
  Filled 2018-05-24 (×7): qty 100

## 2018-05-24 MED ORDER — ACETAMINOPHEN 325 MG PO TABS
650.0000 mg | ORAL_TABLET | Freq: Once | ORAL | Status: AC
  Administered 2018-05-24: 650 mg via ORAL
  Filled 2018-05-24: qty 2

## 2018-05-24 NOTE — Progress Notes (Signed)
Dr. Megan Salon ordered a "orthopantogram". Writer has been informed by x-ray staff that [facility] does not have this machine.

## 2018-05-24 NOTE — Progress Notes (Signed)
Pharmacy Antibiotic Note  Andrew [name] is a 67 y.o. male admitted on 05/20/2018 with febrile neutropenia.  Pharmacy has been consulted for vancomycin and meropenem dosing.  05/24/2018 Abx changing from cefepime to vanc/meropenem w/ temp spike to 103 and neutropenia..  Today, 05/24/18   Day 4 Abxs  Tmax 102.8 8/30 PM, afebrile so far 8/31  WBC 1.8, ANC 0.2 - on Granix  Renal function stable  Cultures no growth to date  Plan: 1) Continue Meropenem 1g IV q8h 2) Continue vancomcycin 1250 mg IV q24 for goal AUC 400-500 3) If continued fevers, may consider adding antifungal agent 4) F/u renal fxn, WBC, temp, culture data     Temp (24hrs), Avg:99.6 F (37.6 C), Min:98.3 F (36.8 C), Max:102.8 F (39.3 C)  Recent Labs  Lab 05/20/18 0904 05/21/18 0557 05/22/18 0600 05/23/18 0530 05/24/18 0415  WBC 2.4* 2.1* 1.9* 1.9* 1.8*  CREATININE 1.30*  --  0.86  --  0.83    Estimated Creatinine Clearance: 83 mL/min (by C-G formula based on SCr of 0.83 mg/dL).    Allergies  Allergen Reactions  . Shellfish Allergy     " I BREAK OUT IN HIVES "   Antimicrobials this admission: 8/27 cefepime>>8/28 8/28 vanco>> 8/28 meropenem>>  Dose adjustments this admission:  Microbiology results: 8/27 BCx: ngtd 8/27 UCx: <10k F 8/31 repeat blood cultures to be ordered per notes   Thank you for allowing pharmacy to be a part of this patient's care.   Adrian Saran, PharmD, BCPS Pager [phone] 05/24/2018 9:09 AM

## 2018-05-24 NOTE — Progress Notes (Signed)
Noted recurrent fever. Repeat cultures are pending Will consult Infectious Disease team

## 2018-05-24 NOTE — Progress Notes (Signed)
Call received from lab, critical lab hgb 6.9, PLT 9. On cover MD notified.

## 2018-05-24 NOTE — Consult Note (Signed)
[facility]    Date of Admission:  05/20/2018           Day 4 vancomycin        Day 4 meropenem       Reason for Consult: Persistent febrile neutropenia    Referring Provider: Dr. Heath Lark  Assessment: Andrew [name] has persistent febrile neutropenia in the setting of myelodysplastic syndrome and recent treatment with 5 azacytidine.  There is no obvious source for his fever.  Blood cultures have been repeated today.  I will continue vancomycin and meropenem and start anidulafungin now.  I will order an orthopantogram to look for evidence of dental abscess.  I will follow-up tomorrow and consider CT scanning of his sinuses and chest. .    Plan: 1. Continue vancomycin and meropenem 2. Start anidulafungin 3. Orthopantogram  Principal Problem:   Febrile neutropenia (HCC) Active Problems:   Pancytopenia (HCC)   Myelodysplasia (myelodysplastic syndrome) (HCC)   Poor dentition   HTN (hypertension)   COPD (chronic obstructive pulmonary disease) (HCC)   Asthma   DM (diabetes mellitus) (HCC)   Tobacco use   Renal insufficiency   Atherosclerosis of aorta (HCC)   Scheduled Meds: . sodium chloride   Intravenous Once  . amLODipine  5 mg Oral Daily  . dorzolamide-timolol  1 drop Both Eyes BID  . latanoprost  1 drop Both Eyes QHS  . metoprolol tartrate  25 mg Oral BID  . montelukast  10 mg Oral QHS  . pantoprazole  40 mg Oral BID  . senna-docusate  2 tablet Oral BID  . Tbo-Filgrastim  300 mcg Subcutaneous Daily   Continuous Infusions: . sodium chloride Stopped (05/24/18 0655)  . meropenem (MERREM) IV 1 g (05/24/18 1715)  . vancomycin Stopped (05/23/18 2230)   PRN Meds:.acetaminophen, albuterol, prochlorperazine, sodium chloride flush  HPI: Andrew [name] is a 67 y.o. male with recently diagnosed myelodysplastic syndrome and pancytopenia.  He received his first dose of 5 azacitidine on 05/06/2018.  His absolute neutrophil count at that time was  300.  He was seen back in the cancer center on 05/20/2018 and was noted to have a temperature of 101.4 degrees.  His absolute neutrophil count was 200.  He was admitted and started on vancomycin and cefepime.  Chest x-ray showed no acute changes and blood cultures were negative.  Cefepime was changed to meropenem.  He has been persistently febrile up to 103.1 degrees.  He states that he feels exactly the same as when he came in the hospital.   Review of Systems: Review of Systems  Constitutional: Positive for chills, fever, malaise/fatigue and weight loss. Negative for diaphoresis.  HENT: Negative for congestion and sore throat.        He has had some soreness of his gums.  Eyes: Negative for pain and redness.  Respiratory: Negative for cough, sputum production and shortness of breath.   Cardiovascular: Negative for chest pain.  Gastrointestinal: Negative for abdominal pain, diarrhea, nausea and vomiting.  Genitourinary: Negative for dysuria.  Musculoskeletal: Negative for back pain, joint pain and neck pain.  Skin: Negative for rash.  Neurological: Negative for headaches.    Past Medical History:  Diagnosis Date  . Abdominal bloating    probable lactose intolorence  . Anemia 04/03/2018  . Asthma   . COPD (chronic obstructive pulmonary disease) (HCC)    mild improvement  . DM (diabetes mellitus) ([place])   . Hearing impairment  asymptomatic  . HTN (hypertension)   . Tobacco use   . Vitamin D deficiency     Social History   Tobacco Use  . Smoking status: Former Smoker    Packs/day: 0.25    Types: Cigarettes    Last attempt to quit: 03/02/2018    Years since quitting: 0.2  . Smokeless tobacco: Never Used  Substance Use Topics  . Alcohol use: Yes    Frequency: Never    Comment: occ  . Drug use: Never    History reviewed. No pertinent family history. Allergies  Allergen Reactions  . Shellfish Allergy     " I BREAK OUT IN HIVES "    OBJECTIVE: Blood pressure 137/64,  pulse 84, temperature 99.5 F (37.5 C), temperature source Oral, resp. rate (!) 22, SpO2 98 %.  Physical Exam  Constitutional: He is oriented to person, place, and time.  He is resting quietly in bed watching television.  HENT:  Mouth/Throat: No oropharyngeal exudate.  Poor dentition.  Eyes: Conjunctivae are normal.  Neck: Neck supple.  Cardiovascular: Normal rate, regular rhythm and normal heart sounds.  Pulmonary/Chest: Effort normal and breath sounds normal. He has no wheezes. He has no rales.  Abdominal: Soft. He exhibits no distension. There is no tenderness.  Genitourinary:  Genitourinary Comments: He has no perirectal swelling or tenderness.  Musculoskeletal: Normal range of motion. He exhibits no edema or tenderness.  Lymphadenopathy:       Head (right side): No submandibular adenopathy present.       Head (left side): No submandibular adenopathy present.    He has no cervical adenopathy.    He has no axillary adenopathy.  Neurological: He is alert and oriented to person, place, and time.  Skin: No rash noted.  There is some bruising around his right arm PICC site.  Psychiatric: He has a normal mood and affect.    Lab Results Lab Results  Component Value Date   WBC 1.8 (L) 05/24/2018   HGB 6.9 (LL) 05/24/2018   HCT 20.0 (L) 05/24/2018   MCV 85.1 05/24/2018   PLT 9 (LL) 05/24/2018    Lab Results  Component Value Date   CREATININE 0.83 05/24/2018   BUN 14 05/22/2018   NA 138 05/22/2018   K 3.7 05/22/2018   CL 104 05/22/2018   CO2 23 05/22/2018    Lab Results  Component Value Date   ALT 10 05/22/2018   AST 12 (L) 05/22/2018   ALKPHOS 67 05/22/2018   BILITOT 0.9 05/22/2018     Microbiology: Recent Results (from the past 240 hour(s))  Culture, Urine     Status: Abnormal   Collection Time: 05/20/18 11:51 AM  Result Value Ref Range Status   Specimen Description   Final    URINE, CLEAN CATCH Performed at [facility], [place]  [address], [place], [place] [address]    Special Requests   Final    NONE Performed at [facility], [place] [address], [place], [place] [address]    Culture (A)  Final    <10,000 COLONIES/mL INSIGNIFICANT GROWTH Performed at [facility] [address] [place], [place] [address]    Report Status 05/21/2018 FINAL  Final  Culture, Blood     Status: None (Preliminary result)   Collection Time: 05/20/18 12:00 PM  Result Value Ref Range Status   Specimen Description   Final    LEFT ANTECUBITAL Performed at [facility], [address] [place], [place] [address]    Special Requests   Final    BOTTLES DRAWN AEROBIC AND ANAEROBIC Blood Culture adequate volume   Culture   Final    NO GROWTH 4 DAYS Performed at [facility], [place] [address] [place], [place] [address]    Report Status PENDING  Incomplete  Culture, Blood     Status: None (Preliminary result)   Collection Time: 05/20/18 12:15 PM  Result Value Ref Range Status   Specimen Description   Final    BLOOD LEFT ARM Performed at [facility], [place] [address], [place], [place] [address]    Special Requests   Final    BOTTLES DRAWN AEROBIC AND ANAEROBIC Blood Culture adequate volume   Culture   Final    NO GROWTH 4 DAYS Performed at [facility], [place] [address] [place], [place] [address]    Report Status PENDING  Incomplete    Michel Bickers, MD [facility] [place] [facility] [phone] pager   [phone] cell 05/24/2018, 6:07 PM

## 2018-05-24 NOTE — Progress Notes (Signed)
Have paged Dr. Alvy Bimler concerning new fever with pt after 2 units of PRBC. Questioning run of platelets. Have given PO Tylenol for fever of 100.1, now 102.6. Will await return call.

## 2018-05-24 NOTE — Progress Notes (Signed)
Andrew [name]   DOB:06-06-1951   EX#:[id number]    Assessment & Plan:  Myelodysplastic syndrome He had received chemotherapy recently.  Continue supportive care  Acquired pancytopenia I recommend 2 units of blood transfusion and 1 unit of platelet transfusion. He will need irradiated blood products Continue daily G-CSF support  Poor venous access He had recent PICC line placement  Recurrent neutropenic fever He is receiving low-dose G-CSF support Urine culture showed insignificant growth Blood cultures are pending but so far show no evidence of growth He had another fever episode yesterday I recommend repeat blood culture again due to this along with repeat CXR  Severe constipation I will order daily Senokot.  Will add MiraLAX tomorrow if no improvement  Discharge planning He is not ready to be discharged due to recurrent fever.  He needs to be afebrile for minimum 24 to 48 hours before plan for discharge He will likely be here over the weekend   Heath Lark, MD 05/24/2018  6:56 AM   Subjective:  This is a patient of Dr. [name], admitted for neutropenic fever. He continues to have daily fever.  Last febrile episode was last night.  He complained of some chills and night sweats. Denies sore throat.  No recent diarrhea.  No recent dysuria, frequency or urgency. The patient denies any recent signs or symptoms of bleeding such as spontaneous epistaxis, hematuria or hematochezia. He denies cough.  He has been constipated for many days since admission He denies nausea.  Objective:  Vitals:   05/23/18 2219 05/24/18 0438  BP: 137/64 117/66  Pulse: 97 74  Resp:  (!) 22  Temp:  98.3 F (36.8 C)  SpO2:  100%     Intake/Output Summary (Last 24 hours) at 05/24/2018 0656 Last data filed at 05/23/2018 1505 Gross per 24 hour  Intake 2968.22 ml  Output -  Net 2968.22 ml    GENERAL:alert, no distress and comfortable SKIN: skin color, texture, turgor are normal, no rashes or  significant lesions EYES: normal, Conjunctiva are pink and non-injected, sclera clear OROPHARYNX:no exudate, no erythema and lips, buccal mucosa, and tongue normal  NECK: supple, thyroid normal size, non-tender, without nodularity LYMPH:  no palpable lymphadenopathy in the cervical, axillary or inguinal LUNGS: clear to auscultation and percussion with normal breathing effort HEART: regular rate & rhythm and no murmurs and no lower extremity edema ABDOMEN:abdomen soft, mild tenderness on palpation on the right lower quadrant without rebound or guarding.  He appears somewhat bloated Musculoskeletal:no cyanosis of digits and no clubbing  NEURO: alert & oriented x 3 with fluent speech, no focal motor/sensory deficits   Labs:  Lab Results  Component Value Date   WBC 1.8 (L) 05/24/2018   HGB 6.9 (LL) 05/24/2018   HCT 20.0 (L) 05/24/2018   MCV 85.1 05/24/2018   PLT 9 (LL) 05/24/2018   NEUTROABS 0.2 (L) 05/24/2018    Lab Results  Component Value Date   NA 138 05/22/2018   K 3.7 05/22/2018   CL 104 05/22/2018   CO2 23 05/22/2018    Studies:  Ir Picc Placement Right >5 Yrs Inc Img Guide  Result Date: 05/22/2018 INDICATION: Anemia. EXAM: RIGHT UPPER EXTREMITY PICC LINE PLACEMENT WITH ULTRASOUND AND FLUOROSCOPIC GUIDANCE MEDICATIONS: None ANESTHESIA/SEDATION: None FLUOROSCOPY TIME:  Fluoroscopy Time:  minutes 12 seconds (1 mGy). COMPLICATIONS: None immediate. PROCEDURE: The patient was advised of the possible risks and complications and agreed to undergo the procedure. The patient was then brought to the angiographic suite for the  procedure. The right arm was prepped with chlorhexidine, draped in the usual sterile fashion using maximum barrier technique (cap and mask, sterile gown, sterile gloves, large sterile sheet, hand hygiene and cutaneous antiseptic). Local anesthesia was attained by infiltration with 1% lidocaine. Ultrasound demonstrated patency of the basilic vein, and this was  documented with an image. Under real-time ultrasound guidance, this vein was accessed with a 21 gauge micropuncture needle and image documentation was performed. The needle was exchanged over a guidewire for a peel-away sheath through which a 40 cm 5 [place] single lumen power injectable PICC was advanced, and positioned with its tip at the lower SVC/right atrial junction. Fluoroscopy during the procedure and fluoro spot radiograph confirms appropriate catheter position. The catheter was flushed, secured to the skin with Prolene sutures, and covered with a sterile dressing. IMPRESSION: Successful placement of a right arm PICC with sonographic and fluoroscopic guidance. The catheter is ready for use. Electronically Signed   By: Marybelle Killings M.D.   On: 05/22/2018 17:19

## 2018-05-25 LAB — COMPREHENSIVE METABOLIC PANEL
ALK PHOS: 65 U/L (ref 38–126)
ALT: 12 U/L (ref 0–44)
ANION GAP: 12 (ref 5–15)
AST: 15 U/L (ref 15–41)
Albumin: 2.2 g/dL — ABNORMAL LOW (ref 3.5–5.0)
BILIRUBIN TOTAL: 0.6 mg/dL (ref 0.3–1.2)
BUN: 12 mg/dL (ref 8–23)
CALCIUM: 8.6 mg/dL — AB (ref 8.9–10.3)
CO2: 26 mmol/L (ref 22–32)
CREATININE: 0.69 mg/dL (ref 0.61–1.24)
Chloride: 100 mmol/L (ref 98–111)
Glucose, Bld: 111 mg/dL — ABNORMAL HIGH (ref 70–99)
Potassium: 3.3 mmol/L — ABNORMAL LOW (ref 3.5–5.1)
Sodium: 138 mmol/L (ref 135–145)
TOTAL PROTEIN: 7.2 g/dL (ref 6.5–8.1)

## 2018-05-25 LAB — CULTURE, BLOOD (SINGLE)
Culture: NO GROWTH
Culture: NO GROWTH
SPECIAL REQUESTS: ADEQUATE
SPECIAL REQUESTS: ADEQUATE

## 2018-05-25 LAB — CBC WITH DIFFERENTIAL/PLATELET
BASOS PCT: 1 %
Basophils Absolute: 0 10*3/uL (ref 0.0–0.1)
EOS ABS: 0 10*3/uL (ref 0.0–0.7)
EOS PCT: 2 %
HEMATOCRIT: 27 % — AB (ref 39.0–52.0)
HEMOGLOBIN: 9.2 g/dL — AB (ref 13.0–17.0)
LYMPHS PCT: 57 %
Lymphs Abs: 0.9 10*3/uL (ref 0.7–4.0)
MCH: 28.9 pg (ref 26.0–34.0)
MCHC: 34.1 g/dL (ref 30.0–36.0)
MCV: 84.9 fL (ref 78.0–100.0)
MONOS PCT: 24 %
Monocytes Absolute: 0.4 10*3/uL (ref 0.1–1.0)
NEUTROS ABS: 0.2 10*3/uL — AB (ref 1.7–7.7)
NEUTROS PCT: 16 %
Platelets: 9 10*3/uL — CL (ref 150–400)
RBC: 3.18 MIL/uL — ABNORMAL LOW (ref 4.22–5.81)
RDW: 15 % (ref 11.5–15.5)
WBC: 1.5 10*3/uL — AB (ref 4.0–10.5)

## 2018-05-25 LAB — URIC ACID: Uric Acid, Serum: 4 mg/dL (ref 3.7–8.6)

## 2018-05-25 MED ORDER — DIPHENHYDRAMINE HCL 25 MG PO CAPS
25.0000 mg | ORAL_CAPSULE | Freq: Once | ORAL | Status: AC
  Administered 2018-05-25: 25 mg via ORAL
  Filled 2018-05-25: qty 1

## 2018-05-25 MED ORDER — SODIUM CHLORIDE 0.9% IV SOLUTION
Freq: Once | INTRAVENOUS | Status: AC
  Administered 2018-05-25: 12:00:00 via INTRAVENOUS

## 2018-05-25 MED ORDER — ACETAMINOPHEN 325 MG PO TABS
650.0000 mg | ORAL_TABLET | Freq: Once | ORAL | Status: AC
  Administered 2018-05-25: 650 mg via ORAL
  Filled 2018-05-25: qty 2

## 2018-05-25 NOTE — Progress Notes (Signed)
Patient ID: Andrew [name], male   DOB: Feb 21, 1951, 67 y.o.   MRN: [id number]         [facility]  Date of Admission:  05/20/2018           Day 5 vancomycin        Day 5 meropenem        Day 1 anidulafungin ASSESSMENT: His highest temperature overnight was 100.5 degrees.  His exam remains quite benign with the exception of his poor dentition.  Apparently we are unable to do a thorough pentagram here at [facility].  If he continues to have fever I will obtain sinus and dental CT scan and consider chest CT scan.  Repeat blood cultures are negative to date.  PLAN: 1. Continue current antimicrobial regimen 2. I will follow-up in the morning  Principal Problem:   Febrile neutropenia (HCC) Active Problems:   Pancytopenia (HCC)   Myelodysplasia (myelodysplastic syndrome) (HCC)   Poor dentition   HTN (hypertension)   COPD (chronic obstructive pulmonary disease) (HCC)   Asthma   DM (diabetes mellitus) ([place])   Tobacco use   Renal insufficiency   Atherosclerosis of aorta (HCC)   Scheduled Meds: . sodium chloride   Intravenous Once  . sodium chloride   Intravenous Once  . acetaminophen  650 mg Oral Once  . amLODipine  5 mg Oral Daily  . diphenhydrAMINE  25 mg Oral Once  . dorzolamide-timolol  1 drop Both Eyes BID  . latanoprost  1 drop Both Eyes QHS  . metoprolol tartrate  25 mg Oral BID  . montelukast  10 mg Oral QHS  . pantoprazole  40 mg Oral BID  . senna-docusate  2 tablet Oral BID  . Tbo-Filgrastim  300 mcg Subcutaneous Daily   Continuous Infusions: . sodium chloride Stopped (05/24/18 2133)  . anidulafungin    . meropenem (MERREM) IV Stopped (05/25/18 0528)  . vancomycin Stopped (05/24/18 2128)   PRN Meds:.acetaminophen, albuterol, prochlorperazine, sodium chloride flush   SUBJECTIVE: He is feeling better today.  He had a normal bowel movement this morning.  He is no longer having any gum tenderness.  Review of Systems: Review  of Systems  Constitutional: Positive for diaphoresis. Negative for chills and fever.       He did have one mild sweat overnight but no chills.  HENT: Negative for congestion and sore throat.   Respiratory: Negative for cough, sputum production and shortness of breath.   Cardiovascular: Negative for chest pain.  Gastrointestinal: Negative for abdominal pain, diarrhea, nausea and vomiting.  Genitourinary: Negative for dysuria.  Musculoskeletal: Negative for back pain and joint pain.  Skin: Negative for rash.  Neurological: Negative for headaches.    Allergies  Allergen Reactions  . Shellfish Allergy     " I BREAK OUT IN HIVES "    OBJECTIVE: Vitals:   05/24/18 1710 05/24/18 2003 05/25/18 0015 05/25/18 0436  BP: 137/64 119/70 128/66 137/61  Pulse: 84 83 67 82  Resp: (!) 22 19 18 19   Temp: 99.5 F (37.5 C) 98.9 F (37.2 C) 98.7 F (37.1 C) (!) 100.5 F (38.1 C)  TempSrc: Oral Oral Oral Oral  SpO2: 98% 99% 97% 96%   There is no height or weight on file to calculate BMI.  Physical Exam  Constitutional: He is oriented to person, place, and time.  He is resting comfortably in bed.  HENT:  Mouth/Throat: No oropharyngeal exudate.  Poor dentition.  Eyes: Conjunctivae are  normal.  Neck: Neck supple.  Cardiovascular: Normal rate, regular rhythm and normal heart sounds.  No murmur heard. Pulmonary/Chest: Effort normal and breath sounds normal.  Abdominal: Soft. He exhibits no mass. There is no tenderness.  Musculoskeletal: Normal range of motion. He exhibits no edema or tenderness.  Neurological: He is alert and oriented to person, place, and time.  Skin: No rash noted.  Psychiatric: He has a normal mood and affect.    Lab Results Lab Results  Component Value Date   WBC 1.5 (L) 05/25/2018   HGB 9.2 (L) 05/25/2018   HCT 27.0 (L) 05/25/2018   MCV 84.9 05/25/2018   PLT 9 (LL) 05/25/2018    Lab Results  Component Value Date   CREATININE 0.69 05/25/2018   BUN 12  05/25/2018   NA 138 05/25/2018   K 3.3 (L) 05/25/2018   CL 100 05/25/2018   CO2 26 05/25/2018    Lab Results  Component Value Date   ALT 12 05/25/2018   AST 15 05/25/2018   ALKPHOS 65 05/25/2018   BILITOT 0.6 05/25/2018     Microbiology: Recent Results (from the past 240 hour(s))  Culture, Urine     Status: Abnormal   Collection Time: 05/20/18 11:51 AM  Result Value Ref Range Status   Specimen Description   Final    URINE, CLEAN CATCH Performed at [facility], [place] [address], [place], [place] [address]    Special Requests   Final    NONE Performed at [facility], [place] [address], [place], [place] [address]    Culture (A)  Final    <10,000 COLONIES/mL INSIGNIFICANT GROWTH Performed at [place] [address] [place], [place] [address]    Report Status 05/21/2018 FINAL  Final  Culture, Blood     Status: None   Collection Time: 05/20/18 12:00 PM  Result Value Ref Range Status   Specimen Description   Final    LEFT ANTECUBITAL Performed at [facility], [place] [address] [place], [place] [address]    Special Requests   Final    BOTTLES DRAWN AEROBIC AND ANAEROBIC Blood Culture adequate volume   Culture   Final    NO GROWTH 5 DAYS Performed at [facility], [place] [address], [place], [place] [address]    Report Status 05/25/2018 FINAL  Final  Culture, Blood     Status: None   Collection Time: 05/20/18 12:15 PM  Result Value Ref Range Status   Specimen Description   Final    BLOOD LEFT ARM Performed at [facility], [place] [address], [place], [place] [address]    Special Requests   Final    BOTTLES DRAWN AEROBIC AND ANAEROBIC Blood Culture adequate volume   Culture   Final    NO GROWTH 5 DAYS Performed at [facility], [place] [address] [place], [place] [address]    Report Status 05/25/2018 FINAL  Final  Culture, blood (routine x 2)     Status:  None (Preliminary result)   Collection Time: 05/24/18  4:09 PM  Result Value Ref Range Status   Specimen Description   Final    BLOOD LEFT ANTECUBITAL Performed at [place] [address], [place],  [address]    Special Requests   Final    BOTTLES DRAWN AEROBIC AND ANAEROBIC Blood Culture adequate volume Performed at [place] [address]  [place], [place] [address]    Culture   Final    NO GROWTH < 24 HOURS Performed at [facility], [place] [address], [place], [place] [address]    Report Status PENDING  Incomplete  Culture, blood (routine x 2)     Status: None (Preliminary result)   Collection Time: 05/24/18  4:10 PM  Result Value Ref Range Status   Specimen Description   Final    BLOOD RIGHT HAND Performed at [facility], [place] [address] [place], [place] [address]    Special Requests   Final    BOTTLES DRAWN AEROBIC AND ANAEROBIC Blood Culture adequate volume Performed at [place] [address], [place], [place] [address]    Culture   Final    NO GROWTH < 24 HOURS Performed at [place] [address] [place], [place] [address]    Report Status PENDING  Incomplete    Michel Bickers, MD [facility] [place] [facility] [phone] pager   [phone] cell 05/25/2018, 11:22 AM

## 2018-05-25 NOTE — Progress Notes (Signed)
Andrew [name]   DOB:02/24/1951   EU#:[id number]    Assessment & Plan:  Myelodysplastic syndrome He had received chemotherapy recently.  Continue supportive care  Acquired pancytopenia I recommend 2 units of blood transfusion and 1 unit of platelet transfusion, yesterday, will repeat platelets again today He will need irradiated blood products Continue daily G-CSF support  Poor venous access He had recent PICC line placement  Recurrent neutropenic fever He is receiving low-dose G-CSF support Urine culture showed insignificant growth Blood cultures are pending but so far showed no evidence of growth Appreciate consultation from infectious disease to guide antibiotic appropriate therapy.  Continue the same  Severe constipation Continue laxatives  Discharge planning He is not ready to be discharged due to recurrent fever.  He needs to be afebrile for minimum 24 to 48 hours before plan for discharge He will likely be here over the weekend   Heath Lark, MD 05/25/2018  9:19 AM   Subjective:  The patient continues to have recurrent febrile episodes over the last 24 hours.  Infectious disease team has been consulted and antifungal treatment was added.  So far, cultures are pending/no growth The patient denies any recent signs or symptoms of bleeding such as spontaneous epistaxis, hematuria or hematochezia. He denies cough, sore throat, or diarrhea.  He had some chills with fever yesterday.  Objective:  Vitals:   05/25/18 0015 05/25/18 0436  BP: 128/66 137/61  Pulse: 67 82  Resp: 18 19  Temp: 98.7 F (37.1 C) (!) 100.5 F (38.1 C)  SpO2: 97% 96%     Intake/Output Summary (Last 24 hours) at 05/25/2018 0919 Last data filed at 05/25/2018 0813 Gross per 24 hour  Intake 4938.28 ml  Output 1451 ml  Net 3487.28 ml    GENERAL:alert, no distress and comfortable SKIN: skin color, texture, turgor are normal, no rashes or significant lesions EYES: normal, Conjunctiva are pink and  non-injected, sclera clear OROPHARYNX:no exudate, no erythema and lips, buccal mucosa, and tongue normal  NECK: supple, thyroid normal size, non-tender, without nodularity LYMPH:  no palpable lymphadenopathy in the cervical, axillary or inguinal LUNGS: clear to auscultation and percussion with normal breathing effort HEART: regular rate & rhythm and no murmurs and no lower extremity edema ABDOMEN:abdomen soft, non-tender and normal bowel sounds Musculoskeletal:no cyanosis of digits and no clubbing  NEURO: alert & oriented x 3 with fluent speech, no focal motor/sensory deficits   Labs:  Lab Results  Component Value Date   WBC 1.5 (L) 05/25/2018   HGB 9.2 (L) 05/25/2018   HCT 27.0 (L) 05/25/2018   MCV 84.9 05/25/2018   PLT 9 (LL) 05/25/2018   NEUTROABS 0.2 (L) 05/25/2018    Lab Results  Component Value Date   NA 138 05/25/2018   K 3.3 (L) 05/25/2018   CL 100 05/25/2018   CO2 26 05/25/2018    Studies:  Dg Chest 2 View  Result Date: 05/24/2018 CLINICAL DATA:  Neutropenic fever EXAM: CHEST - 2 VIEW COMPARISON:  Four days ago FINDINGS: Persistent generalized interstitial coarsening that appears bronchitic. No Kerley lines or effusion. No air bronchogram. Normal heart size and mediastinal contours. There is right upper extremity PICC with tip at the upper cavoatrial junction. IMPRESSION: Chronic bronchitic markings. No focal pneumonia or change from prior. Electronically Signed   By: Monte Fantasia M.D.   On: 05/24/2018 11:32

## 2018-05-26 ENCOUNTER — Inpatient Hospital Stay (HOSPITAL_COMMUNITY): Payer: Medicare Other

## 2018-05-26 DIAGNOSIS — H9202 Otalgia, left ear: Secondary | ICD-10-CM

## 2018-05-26 LAB — CBC WITH DIFFERENTIAL/PLATELET
BASOS ABS: 0 10*3/uL (ref 0.0–0.1)
BASOS PCT: 3 %
Eosinophils Absolute: 0 10*3/uL (ref 0.0–0.7)
Eosinophils Relative: 2 %
HEMATOCRIT: 25.1 % — AB (ref 39.0–52.0)
Hemoglobin: 8.6 g/dL — ABNORMAL LOW (ref 13.0–17.0)
LYMPHS ABS: 0.6 10*3/uL — AB (ref 0.7–4.0)
LYMPHS PCT: 58 %
MCH: 29.2 pg (ref 26.0–34.0)
MCHC: 34.3 g/dL (ref 30.0–36.0)
MCV: 85.1 fL (ref 78.0–100.0)
MONOS PCT: 26 %
Monocytes Absolute: 0.3 10*3/uL (ref 0.1–1.0)
Neutro Abs: 0.1 10*3/uL — ABNORMAL LOW (ref 1.7–7.7)
Neutrophils Relative %: 11 %
PLATELETS: 7 10*3/uL — AB (ref 150–400)
RBC: 2.95 MIL/uL — AB (ref 4.22–5.81)
RDW: 15.1 % (ref 11.5–15.5)
WBC: 1.1 10*3/uL — AB (ref 4.0–10.5)

## 2018-05-26 LAB — BPAM PLATELET PHERESIS
BLOOD PRODUCT EXPIRATION DATE: 201909012359
BLOOD PRODUCT EXPIRATION DATE: 201909022359
Blood Product Expiration Date: 201909012359
ISSUE DATE / TIME: 201908301306
ISSUE DATE / TIME: 201908311501
ISSUE DATE / TIME: 201909011230
UNIT TYPE AND RH: 5100
Unit Type and Rh: 5100
Unit Type and Rh: 5100

## 2018-05-26 LAB — BPAM RBC
BLOOD PRODUCT EXPIRATION DATE: 201909252359
BLOOD PRODUCT EXPIRATION DATE: 201909272359
Blood Product Expiration Date: 201909272359
ISSUE DATE / TIME: 201908281531
ISSUE DATE / TIME: 201908311021
ISSUE DATE / TIME: 201908310626
Unit Type and Rh: 5100
Unit Type and Rh: 5100
Unit Type and Rh: 5100

## 2018-05-26 LAB — PREPARE PLATELET PHERESIS
Unit division: 0
Unit division: 0
Unit division: 0

## 2018-05-26 LAB — TYPE AND SCREEN
ABO/RH(D): O POS
ANTIBODY SCREEN: NEGATIVE
UNIT DIVISION: 0
UNIT DIVISION: 0
Unit division: 0

## 2018-05-26 LAB — VANCOMYCIN, TROUGH: VANCOMYCIN TR: 4 ug/mL — AB (ref 15–20)

## 2018-05-26 LAB — VANCOMYCIN, PEAK: VANCOMYCIN PK: 30 ug/mL (ref 30–40)

## 2018-05-26 MED ORDER — TBO-FILGRASTIM 480 MCG/0.8ML ~~LOC~~ SOSY
480.0000 ug | PREFILLED_SYRINGE | Freq: Every day | SUBCUTANEOUS | Status: DC
  Administered 2018-05-26 – 2018-06-03 (×9): 480 ug via SUBCUTANEOUS
  Filled 2018-05-26 (×3): qty 0.8
  Filled 2018-05-26: qty 1
  Filled 2018-05-26 (×7): qty 0.8

## 2018-05-26 NOTE — Progress Notes (Signed)
Pharmacy Antibiotic Note  Andrew [name] is a 67 y.o. male admitted on 05/20/2018 with febrile neutropenia.  Pharmacy has been consulted for vancomycin and meropenem dosing.  05/26/2018 Abx changing from cefepime to vanc/meropenem w/ temp spike to 103 and neutropenia..  Today, 05/26/18   Day 6 Abxs  Tmax 101.1  WBC 1.1, ANC 0.1 - on Granix  Renal function stable  Cultures no growth to date  Vtr=4, Vpk=30, est AUC = 333  Plan: 1) Continue Meropenem 1g IV q8h 2) Increase Vancomycin to 1 Gm IV q12h for est AUC = 533 3) F/u renal fxn, WBC, temp, culture data     Temp (24hrs), Avg:100 F (37.8 C), Min:98.3 F (36.8 C), Max:101.1 F (38.4 C)  Recent Labs  Lab 05/20/18 0904  05/22/18 0600 05/23/18 0530 05/24/18 0415 05/25/18 0406 05/26/18 0505 05/26/18 1939 05/26/18 2232  WBC 2.4*   < > 1.9* 1.9* 1.8* 1.5* 1.1*  --   --   CREATININE 1.30*  --  0.86  --  0.83 0.69  --   --   --   VANCOTROUGH  --   --   --   --   --   --   --  4*  --   VANCOPEAK  --   --   --   --   --   --   --   --  30   < > = values in this interval not displayed.    Estimated Creatinine Clearance: 86.1 mL/min (by C-G formula based on SCr of 0.69 mg/dL).    Allergies  Allergen Reactions  . Shellfish Allergy     " I BREAK OUT IN HIVES "   Antimicrobials this admission: 8/27 cefepime>>8/28 8/28 vanco>> 8/28 meropenem>> 9/1 anidulafungin >>  Dose adjustments this admission:  Microbiology results: 8/27 BCx: ngtd 8/27 UCx: <10k F 8/31 repeat blood cultures to be ordered per notes   Thank you for allowing pharmacy to be a part of this patient's care.    Dorrene German 05/26/2018 11:54 PM

## 2018-05-26 NOTE — Progress Notes (Signed)
CRITICAL VALUE ALERT  Critical Value:  WBC 1.1  Date & Time Notified: 9/2 0644  Provider Notified: Dr. Learta Codding  Orders Received/Actions taken: MD in to see pt

## 2018-05-26 NOTE — Care Management Note (Signed)
Case Management Note  Patient Details  Name: Andrew [name] MRN: [id number] Date of Birth: 1951/06/24  Subjective/Objective:                  Date of Admission:  05/20/2018                                                                                                                        Day 6 vancomycin                                                                                     Day 6 meropenem                                                                                     Day 2 anidulafungin ASSESSMENT: It appears as though his fevers may be trending down but he was still 101.5 degrees last night.  I will continue current antimicrobial therapy and order a maxillofacial CT scan to evaluate sinuses, ears and teeth.  PLAN: 1. Continue current antimicrobial regimen 2. Maxillofacial CT scan  Principal Problem:   Febrile neutropenia (HCC) Active Problems:   Pancytopenia (HCC)   Myelodysplasia (myelodysplastic syndrome) (HCC)   Poor dentition   HTN (hypertension)   COPD (chronic obstructive pulmonary disease) (HCC)   Asthma   DM (diabetes mellitus) ([place])   Tobacco use   Renal insufficiency   Atherosclerosis of aorta ([place]) Note from [id number]-infectious diease\ Discharge Criteria Return to top of [facility] [place] [Expand All / Collapse All]  Continued inpatient stay is needed until 1 or more of the following are present: ? Acceptable patient status for next level of care is achieved. ? ALL of the following are present:   No infection, or status acceptable nfection still present -wcb=1.1/hgb 8.6 platlets=7/ temp=101.5   Isolation not needed, or status acceptable not on isolation at this time   Respiratory status acceptable yes   Pain and nausea absent or adequately managed  managed   Ventilatory status acceptable yes   Neurologic problems absent or stabilized  yes  Muscle or nerve damage absent or stable   Activity level acceptable on bed rest,  weakness present   Intake acceptable/yes    No inpatient interventions needed  Iv meds- iv abx and iv flds    General Discharge  Criteria met no-wcb 1.1/platlets =7/bld. Cultures pending after 2 days no growth/receiving bld and platlets [id number]    Expected Discharge Date:  (unknown)               Expected Discharge Plan:  Home/Self Care  In-House Referral:     Discharge planning Services  CM Consult  Post Acute Care Choice:    Choice offered to:     DME Arranged:    DME Agency:     HH Arranged:    [place] Agency:     Status of Service:  In process, will continue to follow  If discussed at Long Length of Stay Meetings, dates discussed:    Additional Comments:  Leeroy Cha, RN 05/26/2018, 11:42 AM

## 2018-05-26 NOTE — Progress Notes (Signed)
IP PROGRESS NOTE  Subjective:   Andrew [name] continues to have a fever.  No bleeding.  No complaint.  Objective: Vital signs in last 24 hours: Blood pressure (!) 141/71, pulse 86, temperature 100 F (37.8 C), temperature source Oral, resp. rate 17, SpO2 97 %.  Intake/Output from previous day: 09/01 0701 - 09/02 0700 In: 2160.6 [P.O.:240; I.V.:892.6; Blood:348; IV Piggyback:680] Out: 1150 [Urine:1150]  Physical Exam:  HEENT: No thrush or bleeding.  Poor dentition with multiple areas of gingival loss and induration Lungs: Air bilaterally, no respiratory distress Cardiac: Regular rate and rhythm Abdomen: Nontender, no hepatosplenomegaly Extremities: No leg edema Kin: No petechiae  Right PICC  Lab Results: Recent Labs    05/25/18 0406 05/26/18 0505  WBC 1.5* 1.1*  HGB 9.2* 8.6*  HCT 27.0* 25.1*  PLT 9* 7*   ANC 0.1 BMET Recent Labs    05/24/18 0415 05/25/18 0406  NA  --  138  K  --  3.3*  CL  --  100  CO2  --  26  GLUCOSE  --  111*  BUN  --  12  CREATININE 0.83 0.69  CALCIUM  --  8.6*     Studies/Results: Dg Chest 2 View  Result Date: 05/24/2018 CLINICAL DATA:  Neutropenic fever EXAM: CHEST - 2 VIEW COMPARISON:  Four days ago FINDINGS: Persistent generalized interstitial coarsening that appears bronchitic. No Kerley lines or effusion. No air bronchogram. Normal heart size and mediastinal contours. There is right upper extremity PICC with tip at the upper cavoatrial junction. IMPRESSION: Chronic bronchitic markings. No focal pneumonia or change from prior. Electronically Signed   By: Monte Fantasia M.D.   On: 05/24/2018 11:32    Medications: I have reviewed the patient's current medications.  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  Cycle 1 5-azacytidine 05/06/2018 2.Renal insufficiency 3.Diabetes 4.Hemoccult positive stool  Upper endoscopy 04/03/2018-chronic gastritis, duodenitis  Colonoscopy 04/04/2018-diverticulosis in the sigmoid colon 5.Right lower lobe 7 mm subpleural nodule on chest CT 04/02/2018 6.  Admission with febrile neutropenia 05/20/2018- placed on cefepime, antibiotics changed to vancomycin/meropenem on 05/21/2018, anidulafungin added 05/25/2018 7.  Severe neutropenia- G-CSF started 05/22/2018   Andrew [name] is now at day 21 following cycle 1 of 5 azacytidine for treatment of myelodysplasia.  He has persistent severe neutropenia and fever.  No source for infection has been identified.  I appreciate the help from Dr. Megan Salon.  He has not been evaluated by dental medicine.  I will increase the G-CSF dose today.  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  There has been no bleeding.  I will lower the platelet transfusion threshold to 5000.  We contact blood bank to discuss obtaining matched platelets.  Plan: 1.  Antibiotics per Dr. Megan Salon 2.  Increase G-CSF dose today 3.  Dental medicine consult   LOS: 6 days   Betsy Coder, MD   05/26/2018, 8:03 AM

## 2018-05-26 NOTE — Progress Notes (Signed)
Patient ID: Andrew [name], male   DOB: 06/05/1951, 67 y.o.   MRN: [id number]         [facility] for Infectious Disease  Date of Admission:  05/20/2018           Day 6 vancomycin        Day 6 meropenem        Day 2 anidulafungin ASSESSMENT: It appears as though his fevers may be trending down but he was still 101.5 degrees last night.  I will continue current antimicrobial therapy and order a maxillofacial CT scan to evaluate sinuses, ears and teeth.  PLAN: 1. Continue current antimicrobial regimen 2. Maxillofacial CT scan  Principal Problem:   Febrile neutropenia (HCC) Active Problems:   Pancytopenia (HCC)   Myelodysplasia (myelodysplastic syndrome) (HCC)   Poor dentition   HTN (hypertension)   COPD (chronic obstructive pulmonary disease) (HCC)   Asthma   DM (diabetes mellitus) ([place])   Tobacco use   Renal insufficiency   Atherosclerosis of aorta (HCC)   Scheduled Meds: . sodium chloride   Intravenous Once  . amLODipine  5 mg Oral Daily  . dorzolamide-timolol  1 drop Both Eyes BID  . latanoprost  1 drop Both Eyes QHS  . metoprolol tartrate  25 mg Oral BID  . montelukast  10 mg Oral QHS  . pantoprazole  40 mg Oral BID  . senna-docusate  2 tablet Oral BID  . Tbo-Filgrastim  480 mcg Subcutaneous Daily   Continuous Infusions: . sodium chloride Stopped (05/25/18 2143)  . anidulafungin Stopped (05/25/18 2324)  . meropenem (MERREM) IV Stopped (05/26/18 0545)  . vancomycin Stopped (05/25/18 2055)   PRN Meds:.acetaminophen, albuterol, prochlorperazine, sodium chloride flush   SUBJECTIVE: He says that he is feeling well.  He had some sweats again last night and notes some intermittent left ear pain recently.  Review of Systems: Review of Systems  Constitutional: Positive for diaphoresis. Negative for chills and fever.       He did have one mild sweat overnight but no chills.  HENT: Negative for congestion and sore throat.        Intermittent left ear pain.  No  change in hearing.  No more gum tenderness.  Respiratory: Negative for cough, sputum production and shortness of breath.   Cardiovascular: Negative for chest pain.  Gastrointestinal: Negative for abdominal pain, diarrhea, nausea and vomiting.  Genitourinary: Negative for dysuria.  Musculoskeletal: Negative for back pain and joint pain.  Skin: Negative for rash.  Neurological: Negative for headaches.    Allergies  Allergen Reactions  . Shellfish Allergy     " I BREAK OUT IN HIVES "    OBJECTIVE: Vitals:   05/25/18 1957 05/25/18 2147 05/25/18 2355 05/26/18 0411  BP: (!) 142/74  126/67 (!) 141/71  Pulse: 85  71 86  Resp: 17  18 17   Temp: (!) 101.3 F (38.5 C) 100.2 F (37.9 C) 98.3 F (36.8 C) 100 F (37.8 C)  TempSrc: Oral Oral Oral Oral  SpO2: 98%  97% 97%   There is no height or weight on file to calculate BMI.  Physical Exam  Constitutional: He is oriented to person, place, and time.  He is resting comfortably in bed.  HENT:  Mouth/Throat: No oropharyngeal exudate.  Poor dentition.  Mild discomfort when pressing on left tragus.  Eyes: Conjunctivae are normal.  Neck: Neck supple.  Cardiovascular: Normal rate, regular rhythm and normal heart sounds.  No murmur heard. Pulmonary/Chest: Effort normal and  breath sounds normal.  Abdominal: Soft. He exhibits no mass. There is no tenderness.  Musculoskeletal: Normal range of motion. He exhibits no edema or tenderness.  Neurological: He is alert and oriented to person, place, and time.  Skin: No rash noted.  Psychiatric: He has a normal mood and affect.    Lab Results Lab Results  Component Value Date   WBC 1.1 (LL) 05/26/2018   HGB 8.6 (L) 05/26/2018   HCT 25.1 (L) 05/26/2018   MCV 85.1 05/26/2018   PLT 7 (LL) 05/26/2018    Lab Results  Component Value Date   CREATININE 0.69 05/25/2018   BUN 12 05/25/2018   NA 138 05/25/2018   K 3.3 (L) 05/25/2018   CL 100 05/25/2018   CO2 26 05/25/2018    Lab Results    Component Value Date   ALT 12 05/25/2018   AST 15 05/25/2018   ALKPHOS 65 05/25/2018   BILITOT 0.6 05/25/2018     Microbiology: Recent Results (from the past 240 hour(s))  Culture, Urine     Status: Abnormal   Collection Time: 05/20/18 11:51 AM  Result Value Ref Range Status   Specimen Description   Final    URINE, CLEAN CATCH Performed at [facility], [place] [address], [place], [place] [address]    Special Requests   Final    NONE Performed at [facility], [place] [address] [place], [place] [address]    Culture (A)  Final    <10,000 COLONIES/mL INSIGNIFICANT GROWTH Performed at [facility] [address], [place], [place] [address]    Report Status 05/21/2018 FINAL  Final  Culture, Blood     Status: None   Collection Time: 05/20/18 12:00 PM  Result Value Ref Range Status   Specimen Description   Final    LEFT ANTECUBITAL Performed at [facility], [place] [address] [place], [place] [address]    Special Requests   Final    BOTTLES DRAWN AEROBIC AND ANAEROBIC Blood Culture adequate volume   Culture   Final    NO GROWTH 5 DAYS Performed at [facility], [place] [address], [place], [place] [address]    Report Status 05/25/2018 FINAL  Final  Culture, Blood     Status: None   Collection Time: 05/20/18 12:15 PM  Result Value Ref Range Status   Specimen Description   Final    BLOOD LEFT ARM Performed at [facility], [place] [address] [place], [place] [address]    Special Requests   Final    BOTTLES DRAWN AEROBIC AND ANAEROBIC Blood Culture adequate volume   Culture   Final    NO GROWTH 5 DAYS Performed at [facility], [place] [address], [place], [place] [address]    Report Status 05/25/2018 FINAL  Final  Culture, blood (routine x 2)     Status: None (Preliminary result)   Collection Time: 05/24/18  4:09 PM  Result Value Ref Range Status   Specimen  Description   Final    BLOOD LEFT ANTECUBITAL Performed at [place] [address] [place], [place] [address]    Special Requests   Final    BOTTLES DRAWN AEROBIC AND ANAEROBIC Blood Culture adequate volume Performed at [facility] [address], [place],  [address]    Culture   Final    NO GROWTH 2 DAYS Performed at [facility], [address], [place], [place] [address]    Report Status PENDING  Incomplete  Culture, blood (routine x 2)     Status: None (Preliminary result)   Collection Time: 05/24/18  4:10 PM  Result Value Ref Range Status   Specimen Description   Final    BLOOD RIGHT HAND Performed at [facility], [place] [address], [place], [place] [address]    Special Requests   Final    BOTTLES DRAWN AEROBIC AND ANAEROBIC Blood Culture adequate volume Performed at [place] [address], [place], [place] [address]    Culture   Final    NO GROWTH 2 DAYS Performed at [facility] [address], [place], [place] [address]    Report Status PENDING  Incomplete    Michel Bickers, MD [facility] [place] [facility] [phone] pager   [phone] cell 05/26/2018, 9:31 AM

## 2018-05-27 ENCOUNTER — Encounter (HOSPITAL_COMMUNITY): Payer: Self-pay | Admitting: Dentistry

## 2018-05-27 DIAGNOSIS — K056 Periodontal disease, unspecified: Secondary | ICD-10-CM

## 2018-05-27 DIAGNOSIS — D709 Neutropenia, unspecified: Secondary | ICD-10-CM

## 2018-05-27 LAB — PLATELET COUNT

## 2018-05-27 LAB — CBC WITH DIFFERENTIAL/PLATELET
BASOS ABS: 0 10*3/uL (ref 0.0–0.1)
Basophils Relative: 2 %
Eosinophils Absolute: 0 10*3/uL (ref 0.0–0.7)
Eosinophils Relative: 2 %
HEMATOCRIT: 23.9 % — AB (ref 39.0–52.0)
Hemoglobin: 8.1 g/dL — ABNORMAL LOW (ref 13.0–17.0)
LYMPHS ABS: 0.6 10*3/uL (ref 0.7–4.0)
LYMPHS PCT: 67 %
MCH: 29 pg (ref 26.0–34.0)
MCHC: 33.9 g/dL (ref 30.0–36.0)
MCV: 85.7 fL (ref 78.0–100.0)
Monocytes Absolute: 0.2 10*3/uL (ref 0.1–1.0)
Monocytes Relative: 20 %
NEUTROS PCT: 9 %
Neutro Abs: 0.1 10*3/uL (ref 1.7–7.7)
Platelets: <5 10*3/uL — CL (ref 150–400)
RBC: 2.79 MIL/uL — AB (ref 4.22–5.81)
RDW: 14.8 % (ref 11.5–15.5)
WBC: 0.9 10*3/uL — CL (ref 4.0–10.5)

## 2018-05-27 LAB — SAVE SMEAR

## 2018-05-27 MED ORDER — IBUPROFEN 200 MG PO TABS
400.0000 mg | ORAL_TABLET | Freq: Once | ORAL | Status: AC
  Administered 2018-05-27: 400 mg via ORAL
  Filled 2018-05-27: qty 2

## 2018-05-27 MED ORDER — SODIUM CHLORIDE 0.9% IV SOLUTION
Freq: Once | INTRAVENOUS | Status: AC
  Administered 2018-05-27: 11:00:00 via INTRAVENOUS

## 2018-05-27 MED ORDER — CHLORHEXIDINE GLUCONATE 0.12 % MT SOLN
15.0000 mL | Freq: Three times a day (TID) | OROMUCOSAL | Status: DC
  Administered 2018-05-27 – 2018-06-03 (×16): 15 mL via OROMUCOSAL
  Filled 2018-05-27 (×16): qty 15

## 2018-05-27 MED ORDER — VANCOMYCIN HCL IN DEXTROSE 1-5 GM/200ML-% IV SOLN
1000.0000 mg | Freq: Two times a day (BID) | INTRAVENOUS | Status: DC
  Administered 2018-05-27 – 2018-05-29 (×6): 1000 mg via INTRAVENOUS
  Filled 2018-05-27 (×5): qty 200

## 2018-05-27 NOTE — Progress Notes (Signed)
CRITICAL VALUE ALERT  Critical Value:  Temperature = 100.8  Date & Time Notied:  05/27/2018  Provider Notified: MD Edrick Kins B  Orders Received/Actions taken: given tylenol as ordered.

## 2018-05-27 NOTE — Progress Notes (Signed)
Patient ID: Andrew [name], male   DOB: [birth year], 67 y.o.   MRN: [id number]          [facility] for Infectious Disease  Date of Admission:  05/20/2018           Day 7 vancomycin        Day 7 meropenem        Day 3 anidulafungin ASSESSMENT: He remains profoundly neutropenic but it looks like his temperature trend is downward.  Blood cultures remain negative.  CT scan did not show any evidence of sinusitis or otitis.  He does have periodontal disease  PLAN: 1. Continue current antimicrobial regimen. 2. Agree with dental evaluation  Principal Problem:   Febrile neutropenia ([place]) Active Problems:   Pancytopenia (HCC)   Myelodysplasia (myelodysplastic syndrome) (HCC)   Poor dentition   HTN (hypertension)   COPD (chronic obstructive pulmonary disease) (HCC)   Asthma   DM (diabetes mellitus) ([place])   Tobacco use   Renal insufficiency   Atherosclerosis of aorta (HCC)   Scheduled Meds: . sodium chloride   Intravenous Once  . sodium chloride   Intravenous Once  . amLODipine  5 mg Oral Daily  . dorzolamide-timolol  1 drop Both Eyes BID  . latanoprost  1 drop Both Eyes QHS  . metoprolol tartrate  25 mg Oral BID  . montelukast  10 mg Oral QHS  . pantoprazole  40 mg Oral BID  . senna-docusate  2 tablet Oral BID  . Tbo-Filgrastim  480 mcg Subcutaneous Daily   Continuous Infusions: . sodium chloride Stopped (05/26/18 1621)  . anidulafungin Stopped (05/27/18 0000)  . meropenem (MERREM) IV 1 g (05/27/18 0522)  . vancomycin 1,000 mg (05/27/18 0649)   PRN Meds:.acetaminophen, albuterol, prochlorperazine, sodium chloride flush   SUBJECTIVE: Only had one very mild sweats last night.  He is having no more left ear or gum pain.  Review of Systems: Review of Systems  Constitutional: Positive for diaphoresis. Negative for chills and fever.  HENT: Negative for congestion and sore throat.   Respiratory: Negative for cough, sputum production and shortness of breath.     Cardiovascular: Negative for chest pain.  Gastrointestinal: Negative for abdominal pain, diarrhea, nausea and vomiting.  Genitourinary: Negative for dysuria.  Musculoskeletal: Negative for back pain and joint pain.  Skin: Negative for rash.  Neurological: Negative for headaches.    Allergies  Allergen Reactions  . Shellfish Allergy     " I BREAK OUT IN HIVES "    OBJECTIVE: Vitals:   05/26/18 1319 05/26/18 1950 05/27/18 0404 05/27/18 0735  BP:  139/75 130/76 130/76  Pulse:  100 78 78  Resp:  18 19 16   Temp: (!) 100.9 F (38.3 C) 99.6 F (37.6 C) 98.5 F (36.9 C) 98.5 F (36.9 C)  TempSrc: Oral Oral Oral Oral  SpO2:  96% 100% 100%  Weight:    75.4 kg  Height:    5' 7.01" (1.702 m)   Body mass index is 26.04 kg/m.  Physical Exam  Constitutional: He is oriented to person, place, and time.  He is resting comfortably in bed.  HENT:  Mouth/Throat: No oropharyngeal exudate.  Poor dentition.    Eyes: Conjunctivae are normal.  Neck: Neck supple.  Cardiovascular: Normal rate, regular rhythm and normal heart sounds.  No murmur heard. Pulmonary/Chest: Effort normal and breath sounds normal.  Abdominal: Soft. He exhibits no mass. There is no tenderness.  Musculoskeletal: Normal range of motion. He exhibits no edema or tenderness.  Neurological: He is alert and oriented to person, place, and time.  Skin: No rash noted.  Psychiatric: He has a normal mood and affect.    Lab Results Lab Results  Component Value Date   WBC 0.9 (LL) 05/27/2018   HGB 8.1 (L) 05/27/2018   HCT 23.9 (L) 05/27/2018   MCV 85.7 05/27/2018   PLT <5 (LL) 05/27/2018    Lab Results  Component Value Date   CREATININE 0.69 05/25/2018   BUN 12 05/25/2018   NA 138 05/25/2018   K 3.3 (L) 05/25/2018   CL 100 05/25/2018   CO2 26 05/25/2018    Lab Results  Component Value Date   ALT 12 05/25/2018   AST 15 05/25/2018   ALKPHOS 65 05/25/2018   BILITOT 0.6 05/25/2018     Microbiology: Recent  Results (from the past 240 hour(s))  Culture, Urine     Status: Abnormal   Collection Time: 05/20/18 11:51 AM  Result Value Ref Range Status   Specimen Description   Final    URINE, CLEAN CATCH Performed at [facility], [place] [address] [place], [place] [address]    Special Requests   Final    NONE Performed at [facility], [place] [address] [place], [place] [address]    Culture (A)  Final    <10,000 COLONIES/mL INSIGNIFICANT GROWTH Performed at [place] [address] [place], [place] [address]    Report Status 05/21/2018 FINAL  Final  Culture, Blood     Status: None   Collection Time: 05/20/18 12:00 PM  Result Value Ref Range Status   Specimen Description   Final    LEFT ANTECUBITAL Performed at [facility], [place] [address], [place], [place] [address]    Special Requests   Final    BOTTLES DRAWN AEROBIC AND ANAEROBIC Blood Culture adequate volume   Culture   Final    NO GROWTH 5 DAYS Performed at [facility], [place] [address] [place], [place] [address]    Report Status 05/25/2018 FINAL  Final  Culture, Blood     Status: None   Collection Time: 05/20/18 12:15 PM  Result Value Ref Range Status   Specimen Description   Final    BLOOD LEFT ARM Performed at [facility], [place] [address] [place], [place] [address]    Special Requests   Final    BOTTLES DRAWN AEROBIC AND ANAEROBIC Blood Culture adequate volume   Culture   Final    NO GROWTH 5 DAYS Performed at [facility], [place] [address] [place], [place] [address]    Report Status 05/25/2018 FINAL  Final  Culture, blood (routine x 2)     Status: None (Preliminary result)   Collection Time: 05/24/18  4:09 PM  Result Value Ref Range Status   Specimen Description   Final    BLOOD LEFT ANTECUBITAL Performed at [place] [address] [place], [place] [address]    Special  Requests   Final    BOTTLES DRAWN AEROBIC AND ANAEROBIC Blood Culture adequate volume Performed at [place] [address], [place], [place] [address]    Culture   Final    NO GROWTH 2 DAYS Performed at [place] [address], [place], [place] [address]    Report Status PENDING  Incomplete  Culture, blood (routine x 2)     Status: None (  Preliminary result)   Collection Time: 05/24/18  4:10 PM  Result Value Ref Range Status   Specimen Description   Final    BLOOD RIGHT HAND Performed at [facility], [place] [address], [place], [place] [address]    Special Requests   Final    BOTTLES DRAWN AEROBIC AND ANAEROBIC Blood Culture adequate volume Performed at [place] [address] [place], [place] [address]    Culture   Final    NO GROWTH 2 DAYS Performed at [facility] [address] [place], [place] [address]    Report Status PENDING  Incomplete   Maxillofacial CT scan 05/26/2018  IMPRESSION: Positive for periodontal disease involving the root of the left second maxillary molar, and the right maxillary lateral incisor.  Sinuses are clear.   Electronically Signed   By: Jacqulynn Cadet M.D.   On: 05/26/2018 12:49    Michel Bickers, [place] [facility] [place] [facility] [phone] pager   [phone] cell 05/27/2018, 9:35 AM

## 2018-05-27 NOTE — Progress Notes (Signed)
[facility] Infusion Coordinator will follow pt with ID and Oncology teams to support any home infusion pharmacy services at DC as ordered.  If patient discharges after hours, please call [phone].   Andrew [name] 05/27/2018, 10:30 AM

## 2018-05-27 NOTE — Consult Note (Signed)
DENTAL CONSULTATION  Date of Consultation:  05/27/2018 Patient Name:   Andrew [name] Date of Birth:   [birth year] Medical Record Number: [id number]  VITALS: BP 136/68   Pulse 80   Temp 98.8 F (37.1 C) (Oral)   Resp 16   Ht 5' 7.01" (1.702 m)   Wt 75.4 kg   SpO2 100%   BMI 26.04 kg/m   CHIEF COMPLAINT: Patient was referred by Dr. Illene Regulus for a dental consultation.  HPI: Andrew [name] is a 67 year old male with myelodysplastic syndrome and pancytopenia. The patient is currently being treated by Dr. Illene Regulus. Patient referred to Dental Medicine for evaluation of poor dentition and history of gum problems.  The patient currently denies acute toothaches, swellings, or abscesses. Patient has not seen a dentist since his primary dentist, Dr. Christoper Fabian, passed away. This has been at least 2-3 years by patient report.  Patient indicates that his gums have been tender recently. The patient indicates that he is still able to brush his teeth twice a day. Patient denies having any partial dentures. Patient denies having dental phobia.  PROBLEM LIST: Patient Active Problem List   Diagnosis Date Noted  . Myelodysplasia (myelodysplastic syndrome) ([place]) 05/06/2018    Priority: High  . Pancytopenia ([place]) 04/02/2018    Priority: High  . Renal insufficiency 05/24/2018  . Atherosclerosis of aorta ([place]) 05/24/2018  . Poor dentition 05/24/2018  . Febrile neutropenia ([place]) 05/20/2018  . Goals of care, counseling/discussion 05/06/2018  . Allergic rhinitis 10/24/2012  . HTN (hypertension)   . COPD (chronic obstructive pulmonary disease) ([place])   . Asthma   . DM (diabetes mellitus) ([place])   . Tobacco use     PMH: Past Medical History:  Diagnosis Date  . Abdominal bloating    probable lactose intolorence  . Anemia 04/03/2018  . Asthma   . COPD (chronic obstructive pulmonary disease) ([facility])    mild improvement  . DM (diabetes mellitus) ([place])   . Hearing impairment    asymptomatic   . HTN (hypertension)   . Tobacco use   . Vitamin D deficiency     PSH: Past Surgical History:  Procedure Laterality Date  . BIOPSY  04/03/2018   Procedure: BIOPSY;  Surgeon: Otis Brace, MD;  Location: [facility];  Service: Gastroenterology;;  . COLONOSCOPY WITH PROPOFOL N/A 04/04/2018   Procedure: COLONOSCOPY WITH PROPOFOL;  Surgeon: Otis Brace, MD;  Location: [facility];  Service: Gastroenterology;  Laterality: N/A;  . ESOPHAGOGASTRODUODENOSCOPY (EGD) WITH PROPOFOL N/A 04/03/2018   Procedure: ESOPHAGOGASTRODUODENOSCOPY (EGD) WITH PROPOFOL;  Surgeon: Otis Brace, MD;  Location: [facility];  Service: Gastroenterology;  Laterality: N/A;  . EYE SURGERY      ALLERGIES: Allergies  Allergen Reactions  . Shellfish Allergy     " I BREAK OUT IN HIVES "    MEDICATIONS: Current Facility-Administered Medications  Medication Dose Route Frequency Provider Last Rate Last Dose  . 0.9 %  sodium chloride infusion (Manually program via Guardrails IV Fluids)   Intravenous Once Betsy Coder B, MD      . 0.9 %  sodium chloride infusion   Intravenous Continuous Owens Shark, NP   Stopped at 05/26/18 1621  . acetaminophen (TYLENOL) tablet 650 mg  650 mg Oral Q4H PRN Ladell Pier, MD   650 mg at 05/26/18 1035  . albuterol (PROVENTIL) (2.5 MG/3ML) 0.083% nebulizer solution 3 mL  3 mL Inhalation QID PRN Owens Shark, NP      . amLODipine ([place])  tablet 5 mg  5 mg Oral Daily Owens Shark, NP   5 mg at 05/27/18 4403  . anidulafungin (ERAXIS) 100 mg in sodium chloride 0.9 % 100 mL IVPB  100 mg Intravenous Q24H Michel Bickers, MD   Stopped at 05/27/18 0000  . dorzolamide-timolol (COSOPT) 22.3-6.8 MG/ML ophthalmic solution 1 drop  1 drop Both Eyes BID Owens Shark, NP   1 drop at 05/27/18 4742  . latanoprost (XALATAN) 0.005 % ophthalmic solution 1 drop  1 drop Both Eyes QHS Owens Shark, NP   1 drop at 05/26/18 2159  . meropenem (MERREM) 1 g in sodium chloride 0.9 % 100  mL IVPB  1 g Intravenous Q8H Betsy Coder B, MD 200 mL/hr at 05/27/18 0522 1 g at 05/27/18 0522  . metoprolol tartrate (LOPRESSOR) tablet 25 mg  25 mg Oral BID Ladell Pier, MD   25 mg at 05/27/18 0937  . montelukast (SINGULAIR) tablet 10 mg  10 mg Oral QHS Owens Shark, NP   10 mg at 05/26/18 2156  . pantoprazole (PROTONIX) EC tablet 40 mg  40 mg Oral BID Owens Shark, NP   40 mg at 05/27/18 5956  . prochlorperazine (COMPAZINE) tablet 5 mg  5 mg Oral Q6H PRN Owens Shark, NP      . senna-docusate (Senokot-S) tablet 2 tablet  2 tablet Oral BID Heath Lark, MD   2 tablet at 05/27/18 0936  . sodium chloride flush (NS) 0.9 % injection 10-40 mL  10-40 mL Intracatheter PRN Ladell Pier, MD      . Tbo-Filgrastim [facility]) injection 480 mcg  480 mcg Subcutaneous Daily Ladell Pier, MD   480 mcg at 05/27/18 3875  . vancomycin (VANCOCIN) IVPB 1000 mg/200 mL premix  1,000 mg Intravenous Q12H Dorrene German, RPH 200 mL/hr at 05/27/18 6433 1,000 mg at 05/27/18 2951   Facility-Administered Medications Ordered in Other Encounters  Medication Dose Route Frequency Provider Last Rate Last Dose  . 0.9 %  sodium chloride infusion (Manually program via Guardrails IV Fluids)  250 mL Intravenous Once Betsy Coder B, MD      . sodium chloride flush (NS) 0.9 % injection 10 mL  10 mL Intracatheter PRN Ladell Pier, MD        LABS: Lab Results  Component Value Date   WBC 0.9 (LL) 05/27/2018   HGB 8.1 (L) 05/27/2018   HCT 23.9 (L) 05/27/2018   MCV 85.7 05/27/2018   PLT <5 (LL) 05/27/2018      Component Value Date/Time   NA 138 05/25/2018 0406   K 3.3 (L) 05/25/2018 0406   CL 100 05/25/2018 0406   CO2 26 05/25/2018 0406   GLUCOSE 111 (H) 05/25/2018 0406   BUN 12 05/25/2018 0406   CREATININE 0.69 05/25/2018 0406   CREATININE 1.30 (H) 05/20/2018 0904   CALCIUM 8.6 (L) 05/25/2018 0406   GFRNONAA >60 05/25/2018 0406   GFRNONAA 56 (L) 05/20/2018 0904   GFRAA >60 05/25/2018 0406    GFRAA >60 05/20/2018 0904   Lab Results  Component Value Date   INR 1.08 04/03/2018   No results found for: PTT  SOCIAL HISTORY: Social History   Socioeconomic History  . Marital status: Single    Spouse name: Not on file  . Number of children: 1  . Years of education: 74  . Highest education level: Bachelor's degree (e.g., BA, AB, BS)  Occupational History  . Occupation: retired  Scientific laboratory technician  .  Financial resource strain: Not hard at all  . Food insecurity:    Worry: Never true    Inability: Never true  . Transportation needs:    Medical: No    Non-medical: No  Tobacco Use  . Smoking status: Former Smoker    Packs/day: 0.25    Types: Cigarettes    Last attempt to quit: 03/02/2018    Years since quitting: 0.2  . Smokeless tobacco: Never Used  Substance and Sexual Activity  . Alcohol use: Yes    Frequency: Never    Comment: occ  . Drug use: Never  . Sexual activity: Not on file  Lifestyle  . Physical activity:    Days per week: 0 days    Minutes per session: 0 min  . Stress: Not at all  Relationships  . Social connections:    Talks on phone: More than three times a week    Gets together: More than three times a week    Attends religious service: More than 4 times per year    Active member of club or organization: Yes    Attends meetings of clubs or organizations: More than 4 times per year    Relationship status: Divorced  . Intimate partner violence:    Fear of current or ex partner: Not on file    Emotionally abused: Not on file    Physically abused: Not on file    Forced sexual activity: Not on file  Other Topics Concern  . Not on file  Social History Narrative  . Not on file    FAMILY HISTORY: History reviewed. No pertinent family history.  REVIEW OF SYSTEMS: Reviewed with the patient as per History of present illness. Psych: Patient denies having dental phobia.  DENTAL HISTORY: CHIEF COMPLAINT: Patient is a very Dr. Illene Regulus for dental  consultation.  HPI: Andrew [name] is a 67 year old male with myelodysplastic syndrome and pancytopenia. The patient is currently being treated by Dr. Illene Regulus. Patient referred to Dental Medicine for evaluation of poor dentition and history of gum problems.  The patient currently denies acute toothaches, swellings, or abscesses. Patient has not seen a dentist since his primary dentist, Dr. Christoper Fabian, passed away. This has been at least 2-3 years by patient report.  Patient indicates that his gums have been tender recently. The patient indicates that he is still able to brush his teeth twice a day. Patient denies having any partial dentures. Patient denies having dental phobia.  DENTAL EXAMINATION: GENERAL:  The patient is a well-developed, well-nourished male in no acute distress. HEAD AND NECK:  There is no palpable neck lymphadenopathy. The patient denies acute TMJ symptoms. INTRAORAL EXAM:  The patient has normal saliva. I do not see any evidence of oral abscess formation. DENTITION: The patient is missing tooth numbers 1-3, 14, 15, 17, 30, 31, and 32. PERIODONTAL:  Patient has chronic periodontitis with plaque and calculus accumulations, gingival recession, and moderate bone loss. The patient does have gingival inflammation noted but no obvious abscess formation.No bleeding gums are noted. DENTAL CARIES/SUBOPTIMAL RESTORATIONS:  Patient has caries on tooth #9. I would need a full series of dental radiographs to identify other dental caries. I will not obtain these dental radiographs at this time secondary to the significant pancytopenia. ENDODONTIC:  The patient currently denies acute pulpitis symptoms. The patient does appear to have periapical pathology and radiolucency at the apex of tooth #16. CROWN AND BRIDGE:  Patient has crowns on tooth numbers 7, 8,  and 10. PROSTHODONTIC:  Patient denies having any partial dentures. OCCLUSION: The patient has a poor occlusal scheme secondary to  multiple missing teeth, supra-eruption and drifting of the unopposed teeth into the edentulous areas, and lack of replacement of missing teeth with dental prostheses.  RADIOGRAPHIC INTERPRETATION: An orthopantogram was taken on 05/27/2018 in the dental clinic. There are multiple missing teeth. There is supra-eruption and drifting of the unopposed teeth into the edentulous areas. Dental caries are noted. There appears to be periapical radiolucency in pathology associated with the apex of tooth #16. There appears to be a radiopaque area distal to tooth #29 that may be consistent with mandibular exostoses in the area of 30-32.   ASSESSMENTS: 1. Myelodysplastic syndrome 2. Pancytopenia 3. 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 4. Dental caries 5. Chronic periodontitis with bone loss 6. Accretions 7. Gingival recession 8. Multiple missing teeth 9. Supra-eruption and drifting of the unopposed teeth into the edentulous areas 10. Poor occlusal scheme and malocclusion 11. Radiopacity distal to tooth #29 that may represent mandibular exostoses 12. Significant risk for infection from leukopenia with anticipated invasive dental procedures 13. Significant risk for bleeding from thrombocytopenia with anticipated invasive dental procedures   PLAN/RECOMMENDATIONS: 1. I discussed the risks, benefits, and complications of various treatment options with the patient in relationship to his medical and dental conditions, current pancytopenia, and significant risk for infection and bleeding with any dental procedures at this time. We discussed various treatment options to include no treatment, multiple extractions with alveoloplasty, pre-prosthetic surgery as indicated, periodontal therapy, dental restorations, root canal therapy, crown and bridge therapy, implant therapy, and replacement of missing teeth as indicated. The patient currently agrees and expresses understanding with the decision to defer any dental  treatment at this time. The patient is aware that Dr. [name] will refer the patient back to Dental Medicine for evaluation for treatment once he has acceptable platelet and white blood cell levels to allow for the obtaining of full series dental radiographs and discussion of extraction procedures as indicated. Alternatively, the patient may be referred to an oral surgeon at that time. The patient may also follow-up with a primary dentist of his choice for evaluation and treatment as indicated. In the meantime, I will prescribe chlorhexidine rinses for use 3 times daily to aid in disinfection of the oral cavity. Patient will let his medical and nursing staff know if the chlorhexidine rinses cause significant discomfort with use.   2. Discussion of findings with medical team and coordination of future medical and dental care as needed.    Lenn Cal, DDS

## 2018-05-27 NOTE — Progress Notes (Addendum)
IP PROGRESS NOTE  Subjective:   Andrew [name] has [name] complaint.  [name] bleeding.  [name] pain.  [name] fever this morning.  Objective: Vital signs in last 24 hours:Blood pressure 130/76, pulse 78, temperature 98.5 F (36.9 C), temperature source Oral, resp. rate 19, SpO2 100 %.  Intake/Output from previous day: 09/02 0701 - 09/03 0700 In: 220.5 [I.V.:120.5; IV Piggyback:100] Out: 1900 [Urine:1900]  Physical Exam:  HEENT: [name] thrush or bleeding.  Multiple areas of gingival inflammation. Lungs: Mild bilateral wheeze, [name] respiratory distress, inspiratory rhonchi at the left posterior base Cardiac: Regular rate and rhythm Abdomen: Nontender, [name] hepatosplenomegaly Extremities: [name] leg edema Skin: [name] petechiae  Right PICC without erythema  Lab Results: Recent Labs    05/26/18 0505 05/27/18 0323  WBC 1.1* 0.9*  HGB 8.6* 8.1*  HCT 25.1* 23.9*  PLT 7* <5*   ANC 0.1  Blood smear: Rare platelet is present, [name] platelet clumps.  The majority of the white cells are lymphocytes.  There are a few neutrophils and band forms.  [name] blasts. BMET Recent Labs    05/25/18 0406  NA 138  K 3.3*  CL 100  CO2 26  GLUCOSE 111*  BUN 12  CREATININE 0.69  CALCIUM 8.6*     Studies/Results: Ct Maxillofacial Wo Contrast  Result Date: 05/26/2018 CLINICAL DATA:  67 year old male with right upper jaw pain and persistent fever EXAM: CT MAXILLOFACIAL WITHOUT CONTRAST TECHNIQUE: Multidetector CT imaging of the maxillofacial structures was performed. Multiplanar CT image reconstructions were also generated. COMPARISON:  None. FINDINGS: Osseous: [name] fracture or mandibular dislocation. [name] destructive process. Periapical lucency present surrounding the root of the left second maxillary molar consistent with periodontal disease. Additional smaller periapical lucencies present at the right maxillary lateral incisor. Orbits: Negative. [name] traumatic or inflammatory finding. Sinuses: Clear. Soft tissues: Negative. Limited  intracranial: [name] significant or unexpected finding. IMPRESSION: Positive for periodontal disease involving the root of the left second maxillary molar, and the right maxillary lateral incisor. Sinuses are clear. Electronically Signed   By: Jacqulynn Cadet M.D.   On: 05/26/2018 12:49    Medications: I have reviewed the patient's current medications.  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  Cycle 1 5-azacytidine 05/06/2018 2.Renal insufficiency 3.Diabetes 4.Hemoccult positive stool  Upper endoscopy 04/03/2018-chronic gastritis, duodenitis  Colonoscopy 04/04/2018-diverticulosis in the sigmoid colon 5.Right lower lobe 7 mm subpleural nodule on chest CT 04/02/2018 6.  Admission with febrile neutropenia 05/20/2018- placed on cefepime, antibiotics changed to vancomycin/meropenem on 05/21/2018, anidulafungin added 05/25/2018 7.  Severe neutropenia- G-CSF started 05/22/2018, dose increased 05/26/2018 8.  Periodontal disease- CT 05/26/2018 revealed periapical lucencies   Andrew [name] is now at day 22 following cycle 1 5 azacytidine.  He has persistent severe pancytopenia.  He has intermittent fever despite broad-spectrum antibiotics.  A CT yesterday confirmed periodontal disease.  We will contact dental medicine today.  I will review the peripheral blood smear to be sure there are not a significant number of blasts.  We will consider a repeat bone marrow biopsy if there is [name] improvement in the cytopenias this week.  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  We will ask blood bank for matched platelets.  Plan: 1.  Antibiotics per Dr. Megan Salon 2.  Continue G-CSF 3.  Dental  medicine consult 4.  Platelet  transfusion today, ask blood bank for matched platelets   LOS: 7 days   Betsy Coder, MD   05/27/2018, 7:20 AM

## 2018-05-28 LAB — PREPARE RBC (CROSSMATCH)

## 2018-05-28 LAB — PREPARE PLATELET PHERESIS: UNIT DIVISION: 0

## 2018-05-28 LAB — BPAM PLATELET PHERESIS
Blood Product Expiration Date: 201909042359
ISSUE DATE / TIME: 201909031109
Unit Type and Rh: 5100

## 2018-05-28 LAB — CBC WITH DIFFERENTIAL/PLATELET
BASOS ABS: 0 10*3/uL (ref 0.0–0.1)
Basophils Relative: 4 %
EOS ABS: 0 10*3/uL (ref 0.0–0.7)
Eosinophils Relative: 3 %
HCT: 22.1 % — ABNORMAL LOW (ref 39.0–52.0)
HEMOGLOBIN: 7.6 g/dL — AB (ref 13.0–17.0)
LYMPHS PCT: 62 %
Lymphs Abs: 0.5 10*3/uL — ABNORMAL LOW (ref 0.7–4.0)
MCH: 29.3 pg (ref 26.0–34.0)
MCHC: 34.4 g/dL (ref 30.0–36.0)
MCV: 85.3 fL (ref 78.0–100.0)
Monocytes Absolute: 0.2 10*3/uL (ref 0.1–1.0)
Monocytes Relative: 24 %
NEUTROS ABS: 0 10*3/uL — AB (ref 1.7–7.7)
Neutrophils Relative %: 7 %
RBC: 2.59 MIL/uL — AB (ref 4.22–5.81)
RDW: 14.7 % (ref 11.5–15.5)
WBC: 0.7 10*3/uL — CL (ref 4.0–10.5)

## 2018-05-28 MED ORDER — SODIUM CHLORIDE 0.9% IV SOLUTION: Freq: Once | INTRAVENOUS | Status: DC

## 2018-05-28 MED ORDER — SODIUM CHLORIDE 0.9% IV SOLUTION
Freq: Once | INTRAVENOUS | Status: AC
  Administered 2018-05-28: 16:00:00 via INTRAVENOUS

## 2018-05-28 NOTE — Progress Notes (Signed)
Patient ID: Andrew [name], male   DOB: 1950/10/03, 67 y.o.   MRN: [id number]         [facility]  Date of Admission:  05/20/2018           Day 8 vancomycin        Day 8 meropenem        Day 4 anidulafungin ASSESSMENT: [name] continues to have high fevers.  There is no obvious source of infection by exam, chest x-ray, maxillofacial CT and blood cultures.  Resolution of his neutropenia is probably not imminent.  We have already expanded his empiric antimicrobial therapy.  We can consider CT scanning of his chest, abdomen and pelvis although I do not have strong suspicion that we will find anything.  Published guidelines do not address duration of antimicrobial therapy in a clinically stable patient with persistent febrile neutropenia but no obvious source of infection.  PLAN: 1. Continue current antimicrobial regimen. 2. Discuss option of CT chest, abdomen and pelvis with Dr. [name]  Principal Problem:   Febrile neutropenia ([place]) Active Problems:   Pancytopenia ([place])   Myelodysplasia (myelodysplastic syndrome) (H[facility])   Poor dentition   HTN (hypertension)   COPD (chronic obstructive pulmonary disease) (HCC)   Asthma   DM (diabetes mellitus) ([place])   Tobacco use   Renal insufficiency   Atherosclerosis of aorta (H[facility])   Scheduled Meds: . sodium chloride   Intravenous Once  . sodium chloride   Intravenous Once  . sodium chloride   Intravenous Once  . amLODipine  5 mg Oral Daily  . chlorhexidine  15 mL Mouth/Throat TID  . dorzolamide-timolol  1 drop Both Eyes BID  . latanoprost  1 drop Both Eyes QHS  . metoprolol tartrate  25 mg Oral BID  . montelukast  10 mg Oral QHS  . pantoprazole  40 mg Oral BID  . senna-docusate  2 tablet Oral BID  . Tbo-Filgrastim  480 mcg Subcutaneous Daily   Continuous Infusions: . sodium chloride 50 mL/hr at 05/28/18 0900  . anidulafungin Stopped (05/27/18 2200)  . meropenem (MERREM) IV 1 g (05/28/18 1303)  .  vancomycin Stopped (05/28/18 0730)   PRN Meds:.acetaminophen, albuterol, prochlorperazine, sodium chloride flush   SUBJECTIVE: He was aware of his fever last night but had no chills or sweats.  Other than the fever he has felt perfectly well.  Review of Systems: Review of Systems  Constitutional: Positive for fever. Negative for chills, diaphoresis and malaise/fatigue.  HENT: Negative for congestion and sore throat.   Respiratory: Negative for cough, sputum production and shortness of breath.   Cardiovascular: Negative for chest pain.  Gastrointestinal: Negative for abdominal pain, diarrhea, nausea and vomiting.       He denies any perirectal pain.  He has not had a bowel movement in the past 36 hours.  Genitourinary: Negative for dysuria.  Musculoskeletal: Negative for back pain and joint pain.  Skin: Negative for rash.  Neurological: Negative for headaches.    Allergies  Allergen Reactions  . Shellfish Allergy     " I BREAK OUT IN HIVES "    OBJECTIVE: Vitals:   05/27/18 2153 05/27/18 2329 05/28/18 0420 05/28/18 1313  BP:   135/73 139/81  Pulse:   67 (!) 105  Resp:   15 20  Temp: (!) 102.8 F (39.3 C) 99.1 F (37.3 C) 98.1 F (36.7 C) 99.3 F (37.4 C)  TempSrc: Oral Oral  Oral  SpO2:   100% 99%  Weight:      Height:       Body mass index is 26.04 kg/m.  Physical Exam  Constitutional: He is oriented to person, place, and time.  He is resting comfortably in bed watching television.  He says that he has been up walking or out of bed most of the day.  HENT:  Mouth/Throat: No oropharyngeal exudate.  Poor dentition.    Eyes: Conjunctivae are normal.  Neck: Neck supple.  Cardiovascular: Normal rate, regular rhythm and normal heart sounds.  No murmur heard. Pulmonary/Chest: Effort normal and breath sounds normal.  Abdominal: Soft. He exhibits no mass. There is no tenderness.  Genitourinary:  Genitourinary Comments: He has no perirectal swelling or tenderness.    Musculoskeletal: Normal range of motion. He exhibits no edema or tenderness.  Neurological: He is alert and oriented to person, place, and time.  Skin: No rash noted.  Psychiatric: He has a normal mood and affect.    Lab Results Lab Results  Component Value Date   WBC 0.7 (LL) 05/28/2018   HGB 7.6 (L) 05/28/2018   HCT 22.1 (L) 05/28/2018   MCV 85.3 05/28/2018   PLT <5 (LL) 05/28/2018    Lab Results  Component Value Date   CREATININE 0.69 05/25/2018   BUN 12 05/25/2018   NA 138 05/25/2018   K 3.3 (L) 05/25/2018   CL 100 05/25/2018   CO2 26 05/25/2018    Lab Results  Component Value Date   ALT 12 05/25/2018   AST 15 05/25/2018   ALKPHOS 65 05/25/2018   BILITOT 0.6 05/25/2018     Microbiology: Recent Results (from the past 240 hour(s))  Culture, Urine     Status: Abnormal   Collection Time: 05/20/18 11:51 AM  Result Value Ref Range Status   Specimen Description   Final    URINE, CLEAN CATCH Performed at [facility], [place] [address], [place], [place] [address]    Special Requests   Final    NONE Performed at [facility], [place] [address] [place], [place] [address]    Culture (A)  Final    <10,000 COLONIES/mL INSIGNIFICANT GROWTH Performed at [place] [address], [place], [place] [address]    Report Status 05/21/2018 FINAL  Final  Culture, Blood     Status: None   Collection Time: 05/20/18 12:00 PM  Result Value Ref Range Status   Specimen Description   Final    LEFT ANTECUBITAL Performed at [facility], [place] [address], [place], [place] [address]    Special Requests   Final    BOTTLES DRAWN AEROBIC AND ANAEROBIC Blood Culture adequate volume   Culture   Final    NO GROWTH 5 DAYS Performed at [facility], [place] [address] [place], [place] [address]    Report Status 05/25/2018 FINAL  Final  Culture, Blood     Status: None   Collection Time: 05/20/18 12:15 PM   Result Value Ref Range Status   Specimen Description   Final    BLOOD LEFT ARM Performed at [facility], [place] [address] [place], [place] [address]    Special Requests   Final    BOTTLES DRAWN AEROBIC AND ANAEROBIC Blood Culture adequate volume   Culture   Final    NO GROWTH 5 DAYS Performed at [facility], [place] [address] [place], [place] [address]    Report Status 05/25/2018  FINAL  Final  Culture, blood (routine x 2)     Status: None (Preliminary result)   Collection Time: 05/24/18  4:09 PM  Result Value Ref Range Status   Specimen Description   Final    BLOOD LEFT ANTECUBITAL Performed at [place] [address] [place], [place] [address]    Special Requests   Final    BOTTLES DRAWN AEROBIC AND ANAEROBIC Blood Culture adequate volume Performed at [place] [address] [place], [place] [address]    Culture   Final    NO GROWTH 4 DAYS Performed at [facility], [place] [address] [place], [place] [address]    Report Status PENDING  Incomplete  Culture, blood (routine x 2)     Status: None (Preliminary result)   Collection Time: 05/24/18  4:10 PM  Result Value Ref Range Status   Specimen Description   Final    BLOOD RIGHT HAND Performed at [facility], [place] [address] [place], [place] [address]    Special Requests   Final    BOTTLES DRAWN AEROBIC AND ANAEROBIC Blood Culture adequate volume Performed at [place] [address], [place], [place] [address]    Culture   Final    NO GROWTH 4 DAYS Performed at [facility], [place] [address], [place], [place] [address]    Report Status PENDING  Incomplete   Maxillofacial CT scan 05/26/2018  IMPRESSION: Positive for periodontal disease involving the root of the left second maxillary molar, and the right maxillary lateral incisor.  Sinuses are clear.   Electronically Signed   By: Jacqulynn Cadet  M.D.   On: 05/26/2018 12:49    Michel Bickers, [place] [facility] [place] [facility] [phone] pager   [phone] cell 05/28/2018, 3:38 PM

## 2018-05-28 NOTE — Progress Notes (Signed)
IP PROGRESS NOTE  Subjective:   Andrew [name] denies bleeding.  No complaint.  He had a high fever again last night.  Objective: Vital signs in last 24 hours:Blood pressure 135/73, pulse 67, temperature 98.1 F (36.7 C), resp. rate 15, height 5' 7.01" (1.702 m), weight 166 lb 4.8 oz (75.4 kg), SpO2 100 %.  Intake/Output from previous day: 09/03 0701 - 09/04 0700 In: 1529.7 [I.V.:694.7; Blood:205; IV Piggyback:630] Out: -   Physical Exam:  HEENT: No thrush or bleeding.  Multiple areas of gingival inflammation. Lungs: Clear bilaterally, no respiratory distress Cardiac: Regular rate and rhythm Abdomen: Nontender, no hepatosplenomegaly Extremities: No leg edema Skin: No petechiae  Right PICC without erythema  Lab Results: Recent Labs    05/27/18 0323 05/27/18 1442 05/28/18 0432  WBC 0.9*  --  0.7*  HGB 8.1*  --  7.6*  HCT 23.9*  --  22.1*  PLT <5* <5* <5*   ANC 0.1  Blood smear: Rare platelet is present, no platelet clumps.  The majority of the white cells are lymphocytes.  There are a few neutrophils and band forms.  No blasts. BMET No results for input(s): NA, K, CL, CO2, GLUCOSE, BUN, CREATININE, CALCIUM in the last 72 hours.   Studies/Results: Ct Maxillofacial Wo Contrast  Result Date: 05/26/2018 CLINICAL DATA:  67 year old male with right upper jaw pain and persistent fever EXAM: CT MAXILLOFACIAL WITHOUT CONTRAST TECHNIQUE: Multidetector CT imaging of the maxillofacial structures was performed. Multiplanar CT image reconstructions were also generated. COMPARISON:  None. FINDINGS: Osseous: No fracture or mandibular dislocation. No destructive process. Periapical lucency present surrounding the root of the left second maxillary molar consistent with periodontal disease. Additional smaller periapical lucencies present at the right maxillary lateral incisor. Orbits: Negative. No traumatic or inflammatory finding. Sinuses: Clear. Soft tissues: Negative. Limited intracranial: No  significant or unexpected finding. IMPRESSION: Positive for periodontal disease involving the root of the left second maxillary molar, and the right maxillary lateral incisor. Sinuses are clear. Electronically Signed   By: Jacqulynn Cadet M.D.   On: 05/26/2018 12:49    Medications: I have reviewed the patient's current medications.  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  Cycle 1 5-azacytidine 05/06/2018 2.Renal insufficiency 3.Diabetes 4.Hemoccult positive stool  Upper endoscopy 04/03/2018-chronic gastritis, duodenitis  Colonoscopy 04/04/2018-diverticulosis in the sigmoid colon 5.Right lower lobe 7 mm subpleural nodule on chest CT 04/02/2018 6.  Admission with febrile neutropenia 05/20/2018- placed on cefepime, antibiotics changed to vancomycin/meropenem on 05/21/2018, anidulafungin added 05/25/2018 7.  Severe neutropenia- G-CSF started 05/22/2018, dose increased 05/26/2018 8.  Periodontal disease- CT 05/26/2018 revealed periapical lucencies   Andrew [name] is now at day 23 following cycle 1 5 azacytidine.  He has persistent severe neutropenia and thrombocytopenia.  He has been maintained on G-CSF since 05/22/2018. I reviewed the peripheral blood smear yesterday.  There are occasional neutrophils.  I saw no blasts.  We are hoping for improvement in the neutrophil count over the next few days.  The severe cytopenias are secondary to myelodysplasia and the 5 azacytidine.  We will transfuse 1 unit of packed red blood cells in an attempt to decrease bleeding risk.  We will ask the blood bank for crossmatch platelets as he is alloimmunized.  He continues antibiotics as directed  by Dr. Megan Salon.  He  was evaluated by dental medicine yesterday.  He is in need of dental extractions, but Dr. Enrique Sack recommends waiting until the white count and platelets have improved.  Plan: 1.  Continue antibiotics 2.  Continue G-CSF 3.  Red cell transfusion today 4.  Cross matched  platelets as soon as they are available   LOS: 8 days   Betsy Coder, MD   05/28/2018, 6:46 AM

## 2018-05-28 NOTE — Care Management Note (Signed)
Case Management Note  Patient Details  Name: RASHEE MARSCHALL MRN: [id number] Date of Birth: 05-Apr-1951  Subjective/Objective:          Home iv abx pallneed          Action/Plan:  Carolynn Sayers with advanced hhc and iv infusion aware and in place.   Expected Discharge Date:  (unknown)               Expected Discharge Plan:  Home/Self Care  In-House Referral:     Discharge planning Services  CM Consult  Post Acute Care Choice:    Choice offered to:  Patient  DME Arranged:  IV pump/equipment DME Agency:  [place]:  RN, IV Antibiotics HH Agency:  [facility]  Status of Service:  In process, will continue to follow  If discussed at Long Length of Stay Meetings, dates discussed:    Additional Comments:  Leeroy Cha, RN 05/28/2018, 8:49 AM

## 2018-05-28 NOTE — Care Management Important Message (Signed)
Important Message  Patient Details  Name: Andrew [name] MRN: [id number] Date of Birth: 08-26-1951   Medicare Important Message Given:  Yes    Kerin Salen 05/28/2018, 12:00 [place] Message  Patient Details  Name: Andrew [name] MRN: [id number] Date of Birth: April 22, 1951   Medicare Important Message Given:  Yes    Kerin Salen 05/28/2018, 12:00 PM

## 2018-05-29 ENCOUNTER — Other Ambulatory Visit: Payer: Self-pay

## 2018-05-29 LAB — CREATININE, SERUM: CREATININE: 0.72 mg/dL (ref 0.61–1.24)

## 2018-05-29 LAB — CBC WITH DIFFERENTIAL/PLATELET
HCT: 24.1 % — ABNORMAL LOW (ref 39.0–52.0)
Hemoglobin: 8.4 g/dL — ABNORMAL LOW (ref 13.0–17.0)
MCH: 29.7 pg (ref 26.0–34.0)
MCHC: 34.9 g/dL (ref 30.0–36.0)
MCV: 85.2 fL (ref 78.0–100.0)
RBC: 2.83 MIL/uL — AB (ref 4.22–5.81)
RDW: 14.4 % (ref 11.5–15.5)
WBC: 0.6 10*3/uL — CL (ref 4.0–10.5)

## 2018-05-29 LAB — CULTURE, BLOOD (ROUTINE X 2)
Culture: NO GROWTH
Culture: NO GROWTH
Special Requests: ADEQUATE
Special Requests: ADEQUATE

## 2018-05-29 LAB — VANCOMYCIN, PEAK: VANCOMYCIN PK: 21 ug/mL — AB (ref 30–40)

## 2018-05-29 NOTE — Progress Notes (Signed)
Patient ID: Andrew [name], male   DOB: Dec 11, 1950, 67 y.o.   MRN: [id number]         [facility]  Date of Admission:  05/20/2018           Day 9 vancomycin        Day 9 meropenem        Day 5 anidulafungin ASSESSMENT: His temperature curve is trending down and his most recent fever may have been due to his platelet transfusion.  I favor holding off on any further diagnostic evaluation at this time.  PLAN: 1. Continue current antimicrobial regimen.  Principal Problem:   Febrile neutropenia ([facility]) Active Problems:   Pancytopenia ([facility])   Myelodysplasia (myelodysplastic syndrome) (H[facility])   Poor dentition   HTN (hypertension)   COPD (chronic obstructive pulmonary disease) (H[facility])   Asthma   DM (diabetes mellitus) (H[facility])   Tobacco use   Renal insufficiency   Atherosclerosis of aorta (H[facility])   Scheduled Meds: . sodium chloride   Intravenous Once  . sodium chloride   Intravenous Once  . amLODipine  5 mg Oral Daily  . chlorhexidine  15 mL Mouth/Throat TID  . dorzolamide-timolol  1 drop Both Eyes BID  . latanoprost  1 drop Both Eyes QHS  . metoprolol tartrate  25 mg Oral BID  . montelukast  10 mg Oral QHS  . pantoprazole  40 mg Oral BID  . senna-docusate  2 tablet Oral BID  . Tbo-Filgrastim  480 mcg Subcutaneous Daily   Continuous Infusions: . sodium chloride 50 mL/hr at 05/28/18 1659  . anidulafungin 100 mg (05/28/18 2100)  . meropenem (MERREM) IV 1 g (05/29/18 1439)  . vancomycin 1,000 mg (05/29/18 0622)   PRN Meds:.acetaminophen, albuterol, prochlorperazine, sodium chloride flush   SUBJECTIVE: His only fever in the past 24 hours was 101.1 degree and it occurred at the tail end of his platelet transfusion.  It was associated with some chills and transient shortness of breath.  Other than that he has felt perfectly well today.  Review of Systems: Review of Systems  Constitutional: Positive for chills and fever. Negative for diaphoresis and  malaise/fatigue.  HENT: Negative for congestion and sore throat.   Respiratory: Positive for shortness of breath. Negative for cough and sputum production.   Cardiovascular: Negative for chest pain.  Gastrointestinal: Negative for abdominal pain, diarrhea, nausea and vomiting.       He denies any perirectal pain.  He has not had a bowel movement in the past 36 hours.  Genitourinary: Negative for dysuria.  Musculoskeletal: Negative for back pain and joint pain.  Skin: Negative for rash.  Neurological: Negative for headaches.    Allergies  Allergen Reactions  . Shellfish Allergy     " I BREAK OUT IN HIVES "    OBJECTIVE: Vitals:   05/29/18 0545 05/29/18 0619 05/29/18 0732 05/29/18 1408  BP:  132/75 126/68 126/71  Pulse:  (!) 116 (!) 101 79  Resp:   20 20  Temp:  (!) 101.1 F (38.4 C) 99 F (37.2 C) 98.8 F (37.1 C)  TempSrc:  Oral Oral   SpO2: 97% 100% 95% 98%  Weight:      Height:       Body mass index is 26.04 kg/m.  Physical Exam  Constitutional: He is oriented to person, place, and time.  He is resting comfortably in bed watching television.  He is bored.  HENT:  Mouth/Throat: No oropharyngeal exudate.  Poor dentition.  Eyes: Conjunctivae are normal.  Neck: Neck supple.  Cardiovascular: Normal rate, regular rhythm and normal heart sounds.  No murmur heard. Pulmonary/Chest: Effort normal and breath sounds normal.  Abdominal: Soft. He exhibits no mass. There is no tenderness.  Genitourinary:  Genitourinary Comments: He has no perirectal swelling or tenderness.  Musculoskeletal: Normal range of motion. He exhibits no edema or tenderness.  Neurological: He is alert and oriented to person, place, and time.  Skin: No rash noted.  Psychiatric: He has a normal mood and affect.    Lab Results Lab Results  Component Value Date   WBC 0.6 (LL) 05/29/2018   HGB 8.4 (L) 05/29/2018   HCT 24.1 (L) 05/29/2018   MCV 85.2 05/29/2018   PLT <5 (LL) 05/29/2018    Lab  Results  Component Value Date   CREATININE 0.72 05/29/2018   BUN 12 05/25/2018   NA 138 05/25/2018   K 3.3 (L) 05/25/2018   CL 100 05/25/2018   CO2 26 05/25/2018    Lab Results  Component Value Date   ALT 12 05/25/2018   AST 15 05/25/2018   ALKPHOS 65 05/25/2018   BILITOT 0.6 05/25/2018     Microbiology: Recent Results (from the past 240 hour(s))  Culture, Urine     Status: Abnormal   Collection Time: 05/20/18 11:51 AM  Result Value Ref Range Status   Specimen Description   Final    URINE, CLEAN CATCH Performed at [facility], [place] [address], [place], [place] [address]    Special Requests   Final    NONE Performed at [facility], [place] [address] [place], [place] [address]    Culture (A)  Final    <10,000 COLONIES/mL INSIGNIFICANT GROWTH Performed at [place] [address] [place], [place] [address]    Report Status 05/21/2018 FINAL  Final  Culture, Blood     Status: None   Collection Time: 05/20/18 12:00 PM  Result Value Ref Range Status   Specimen Description   Final    LEFT ANTECUBITAL Performed at [facility], [place] [address], [place], [place] [address]    Special Requests   Final    BOTTLES DRAWN AEROBIC AND ANAEROBIC Blood Culture adequate volume   Culture   Final    NO GROWTH 5 DAYS Performed at [facility], [place] [address] [place], [place] [address]    Report Status 05/25/2018 FINAL  Final  Culture, Blood     Status: None   Collection Time: 05/20/18 12:15 PM  Result Value Ref Range Status   Specimen Description   Final    BLOOD LEFT ARM Performed at [facility], [place] [address] [place], [place] [address]    Special Requests   Final    BOTTLES DRAWN AEROBIC AND ANAEROBIC Blood Culture adequate volume   Culture   Final    NO GROWTH 5 DAYS Performed at [facility], [place] [address], [place], [place] [address]    Report Status  05/25/2018 FINAL  Final  Culture, blood (routine x 2)     Status: None   Collection Time: 05/24/18  4:09 PM  Result Value Ref Range Status   Specimen Description   Final    BLOOD LEFT ANTECUBITAL Performed at [place] [address] [place],  [address]    Special Requests   Final    BOTTLES DRAWN AEROBIC AND ANAEROBIC Blood  Culture adequate volume Performed at [place] [address] [place], [place] [address]    Culture   Final    NO GROWTH 5 DAYS Performed at [facility], [place] [address], [place], [place] [address]    Report Status 05/29/2018 FINAL  Final  Culture, blood (routine x 2)     Status: None   Collection Time: 05/24/18  4:10 PM  Result Value Ref Range Status   Specimen Description   Final    BLOOD RIGHT HAND Performed at [facility], [place] [address] [place], [place] [address]    Special Requests   Final    BOTTLES DRAWN AEROBIC AND ANAEROBIC Blood Culture adequate volume Performed at [place] [address], [place], [place] [address]    Culture   Final    NO GROWTH 5 DAYS Performed at [facility], [place] [address], [place],  [address]    Report Status 05/29/2018 FINAL  Final   Maxillofacial CT scan 05/26/2018  IMPRESSION: Positive for periodontal disease involving the root of the left second maxillary molar, and the right maxillary lateral incisor.  Sinuses are clear.   Electronically Signed   By: Jacqulynn Cadet M.D.   On: 05/26/2018 12:49    Michel Bickers, [place] [facility] [phone] pager   [phone] cell 05/29/2018, 3:02 PM

## 2018-05-29 NOTE — Progress Notes (Addendum)
Pharmacy Antibiotic Note  Andrew [name] is a 67 y.o. male admitted on 05/20/2018 with febrile neutropenia.  Pharmacy has been consulted for vancomycin and meropenem dosing.  05/29/2018 Abx changing from cefepime to vanc/meropenem w/ temp spike to 103 and neutropenia..  Today, 05/29/18   Day 9 Abxs  Tmax 101.1  WBC 0.7, ANC 0.0 - on Granix  Renal function stable  Cultures no growth final  Plan: 1) Continue Meropenem 1g IV q8h 2) continue Vancomycin to 1 Gm IV q12h, will obtain peak and trough with next dose 3) F/u renal fxn, WBC, temp, culture data 4) f/u ID recs for LOT  Height: 5' 7.01" (170.2 cm) Weight: 166 lb 4.8 oz (75.4 kg) IBW/kg (Calculated) : 66.12  Temp (24hrs), Avg:99.3 F (37.4 C), Min:98.2 F (36.8 C), Max:101.1 F (38.4 C)  Recent Labs  Lab 05/24/18 0415 05/25/18 0406 05/26/18 0505 05/26/18 1939 05/26/18 2232 05/27/18 0323 05/28/18 0432 05/29/18 0619  WBC 1.8* 1.5* 1.1*  --   --  0.9* 0.7*  --   CREATININE 0.83 0.69  --   --   --   --   --  0.72  VANCOTROUGH  --   --   --  4*  --   --   --   --   VANCOPEAK  --   --   --   --  30  --   --   --     Estimated Creatinine Clearance: 86.1 mL/min (by C-G formula based on SCr of 0.72 mg/dL).    Allergies  Allergen Reactions  . Shellfish Allergy     " I BREAK OUT IN HIVES "   Antimicrobials this admission: 8/27 cefepime>>8/28 8/28 vanco>> 8/28 meropenem>> 9/1 anidulafungin >>  Dose adjustments this admission:  Microbiology results: 8/27 BCx: ngf 8/27 UCx: <10k F 8/31 NGF   Thank you for allowing pharmacy to be a part of this patient's care.    Dolly Rias RPh 05/29/2018, 7:48 AM Pager [phone]

## 2018-05-29 NOTE — Progress Notes (Signed)
IP PROGRESS NOTE  Subjective:   [name] developed a fever, shaking chills, and dyspnea immediately following a platelet transfusion this morning.  The symptoms have resolved.  No complaint at present.  No bleeding.  Objective: Vital signs in last 24 hours:Blood pressure 132/75, pulse (!) 116, temperature (!) 101.1 F (38.4 C), temperature source Oral, resp. rate 16, height 5' 7.01" (1.702 m), weight 166 lb 4.8 oz (75.4 kg), SpO2 100 %.  Intake/Output from previous day: 09/04 0701 - 09/05 0700 In: 2225.1 [P.O.:240; I.V.:866.3; IV Piggyback:1118.8] Out: 300 [Urine:300]  Physical Exam:  HEENT: No thrush or bleeding. Lungs: Clear bilaterally, no respiratory distress Cardiac: Regular rate and rhythm with an occasional pause Abdomen: Nontender, no hepatosplenomegaly Extremities: No leg edema Skin: No petechiae, no rash  Right PICC without erythema  Lab Results: Recent Labs    05/27/18 0323 05/27/18 1442 05/28/18 0432  WBC 0.9*  --  0.7*  HGB 8.1*  --  7.6*  HCT 23.9*  --  22.1*  PLT <5* <5* <5*     BMET Recent Labs    05/29/18 0619  CREATININE 0.72     Studies/Results: No results found.  Medications: I have reviewed the patient's current medications.  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  Cycle 1 5-azacytidine 05/06/2018 2.Renal insufficiency 3.Diabetes 4.Hemoccult positive stool  Upper endoscopy 04/03/2018-chronic gastritis, duodenitis  Colonoscopy 04/04/2018-diverticulosis in the sigmoid colon 5.Right lower lobe 7 mm subpleural nodule on chest CT 04/02/2018 6.  Admission with febrile neutropenia 05/20/2018- placed on cefepime,  antibiotics changed to vancomycin/meropenem on 05/21/2018, anidulafungin added 05/25/2018 7.  Severe neutropenia- G-CSF started 05/22/2018, dose increased 05/26/2018 8.  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  Febrile reaction following a platelet transfusion 05/29/2018   [name] is now at day 24 following cycle 1 5 azacytidine.  He has persistent severe neutropenia and thrombocytopenia.  The platelet count has not responded to transfusions.  He has been maintained on G-CSF since 05/22/2018 without improvement.  The fever curve was improved yesterday.  He spiked a fever after the platelet transfusion early this morning.   Plan: 1.  Continue antibiotics 2.  Continue G-CSF 3.  Schedule a crossmatch transfusion for 05/30/2018   LOS: 9 days   Betsy Coder, MD   05/29/2018, 7:21 AM

## 2018-05-30 LAB — CBC WITH DIFFERENTIAL/PLATELET
BASOS PCT: 1 %
Basophils Absolute: 0 10*3/uL (ref 0.0–0.1)
EOS PCT: 4 %
Eosinophils Absolute: 0 10*3/uL (ref 0.0–0.7)
HCT: 21.9 % — ABNORMAL LOW (ref 39.0–52.0)
HEMOGLOBIN: 7.6 g/dL — AB (ref 13.0–17.0)
LYMPHS PCT: 67 %
Lymphs Abs: 0.6 10*3/uL — ABNORMAL LOW (ref 0.7–4.0)
MCH: 29.6 pg (ref 26.0–34.0)
MCHC: 34.7 g/dL (ref 30.0–36.0)
MCV: 85.2 fL (ref 78.0–100.0)
MONO ABS: 0.2 10*3/uL (ref 0.1–1.0)
MONOS PCT: 19 %
NEUTROS PCT: 9 %
Neutro Abs: 0.1 10*3/uL — ABNORMAL LOW (ref 1.7–7.7)
Platelets: <5 10*3/uL — CL (ref 150–400)
RBC: 2.57 MIL/uL — ABNORMAL LOW (ref 4.22–5.81)
RDW: 14.4 % (ref 11.5–15.5)
WBC: 0.9 10*3/uL — CL (ref 4.0–10.5)

## 2018-05-30 LAB — PLATELET COUNT: Platelets: 10 10*3/uL — CL (ref 150–400)

## 2018-05-30 LAB — VANCOMYCIN, TROUGH: Vancomycin Tr: 9 ug/mL — ABNORMAL LOW (ref 15–20)

## 2018-05-30 MED ORDER — VANCOMYCIN HCL 10 G IV SOLR
1250.0000 mg | Freq: Two times a day (BID) | INTRAVENOUS | Status: DC
  Administered 2018-05-30 – 2018-05-31 (×3): 1250 mg via INTRAVENOUS
  Filled 2018-05-30 (×4): qty 1250

## 2018-05-30 MED ORDER — TRAZODONE HCL 100 MG PO TABS
100.0000 mg | ORAL_TABLET | Freq: Every day | ORAL | Status: AC
  Administered 2018-05-30: 100 mg via ORAL
  Filled 2018-05-30: qty 1

## 2018-05-30 MED ORDER — TRAZODONE HCL 100 MG PO TABS: 100.0000 mg | ORAL_TABLET | Freq: Every day | ORAL | Status: DC

## 2018-05-30 MED ORDER — SODIUM CHLORIDE 0.9% IV SOLUTION: Freq: Once | INTRAVENOUS | Status: DC

## 2018-05-30 NOTE — Progress Notes (Signed)
IP PROGRESS NOTE  Subjective:   [name] did not have a high fever yesterday after recovering from the platelet transfusion reaction..  No bleeding.  Objective: Vital signs in last 24 hours:Blood pressure (!) 122/57, pulse 84, temperature 98.3 F (36.8 C), temperature source Oral, resp. rate 19, height 5' 7.01" (1.702 m), weight 166 lb 4.8 oz (75.4 kg), SpO2 97 %.  Intake/Output from previous day: 09/05 0701 - 09/06 0700 In: 3747.4 [P.O.:120; I.V.:728.5; IV Piggyback:2899] Out: 550 [Urine:550]  Physical Exam:  HEENT: No thrush or active bleeding. Lungs: Clear bilaterally, no respiratory distress Cardiac: Regular rate and rhythm  Abdomen: Nontender, no hepatosplenomegaly Extremities: No leg edema Skin: No petechiae, no rash  Right PICC without erythema  Lab Results: Recent Labs    05/29/18 0619 05/30/18 0456  WBC 0.6* 0.9*  HGB 8.4* 7.6*  HCT 24.1* 21.9*  PLT <5* <5*     BMET Recent Labs    05/29/18 0619  CREATININE 0.72     Studies/Results: No results found.  Medications: I have reviewed the patient's current medications.  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  Cycle 1 5-azacytidine 05/06/2018 2.Renal insufficiency 3.Diabetes 4.Hemoccult positive stool  Upper endoscopy 04/03/2018-chronic gastritis, duodenitis  Colonoscopy 04/04/2018-diverticulosis in the sigmoid colon 5.Right lower lobe 7 mm subpleural nodule on chest CT 04/02/2018 6.  Admission with febrile neutropenia 05/20/2018- placed on cefepime, antibiotics changed to vancomycin/meropenem on 05/21/2018, anidulafungin added 05/25/2018 7.  Severe neutropenia- G-CSF started  05/22/2018, dose increased 05/26/2018 8.  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  Febrile reaction following a platelet transfusion 05/29/2018   Mr. Gangemi is now at day 25 following cycle 1 5 azacytidine.  He has persistent severe pancytopenia.  The fever has improved over the past few days.  He continues G-CSF.  He has platelet alloimmunization.  We will transfuse another unit of crossmatched platelets today.  Dr. Megan Salon recommends discontinuing antibiotics if he has no fever today.  He will remain hospitalized until there is evidence of neutrophil and platelet count recovery.   Plan: 1.  Continue antibiotics, consider stopping antibiotics if he remains afebrile over the next 24 hours 2.  Continue G-CSF 3.  Schedule a crossmatch transfusion for 05/30/2018   LOS: 10 days   Betsy Coder, MD   05/30/2018, 7:04 AM

## 2018-05-30 NOTE — Progress Notes (Signed)
Rx Brief note: Vancomycin  See 9/5 note by Seleta Rhymes, Rph for details  Asessement: VPk 2145 9/5:  21 mcg/ml VTr 0456 9/6: 9 mcg/ml  Calculated AUC = 380, below goal of 400-500  Plan: Will increase Vancomycin to 1250 mg IV q12h for est AUC = 475 F/u scr/cultures/levels  Thanks Dorrene German 05/30/2018 6:37 AM

## 2018-05-30 NOTE — Progress Notes (Addendum)
Patient ID: Andrew [name], male   DOB: 06/09/1951, 67 y.o.   MRN: [id number]         [facility]  Date of Admission:  05/20/2018           Day 10 vancomycin        Day 10 meropenem        Day 6 anidulafungin ASSESSMENT: He remains profoundly neutropenic but he has not had any fever in the past 24 hours.  His temperature curve has been trending down over the past several days.  No source of fever has been identified.  If he remains afebrile overnight I would recommend stopping empiric antimicrobial therapy tomorrow.  PLAN: 1. Continue current antimicrobial regimen but consider stopping tomorrow if he remains afebrile 2. Please call my partner, Dr. Lita Mains [phone]) for any infectious disease questions this weekend  Principal Problem:   Febrile neutropenia ([place]) Active Problems:   Pancytopenia ([place])   Myelodysplasia (myelodysplastic syndrome) ([place])   Poor dentition   HTN (hypertension)   COPD (chronic obstructive pulmonary disease) ([place])   Asthma   DM (diabetes mellitus) ([place])   Tobacco use   Renal insufficiency   Atherosclerosis of aorta ([place])   Scheduled Meds: . sodium chloride   Intravenous Once  . sodium chloride   Intravenous Once  . amLODipine  5 mg Oral Daily  . chlorhexidine  15 mL Mouth/Throat TID  . dorzolamide-timolol  1 drop Both Eyes BID  . latanoprost  1 drop Both Eyes QHS  . metoprolol tartrate  25 mg Oral BID  . montelukast  10 mg Oral QHS  . pantoprazole  40 mg Oral BID  . senna-docusate  2 tablet Oral BID  . Tbo-Filgrastim  480 mcg Subcutaneous Daily   Continuous Infusions: . sodium chloride Stopped (05/30/18 0203)  . anidulafungin Stopped (05/29/18 2352)  . meropenem (MERREM) IV 1 g (05/30/18 0600)  . vancomycin     PRN Meds:.acetaminophen, albuterol, prochlorperazine, sodium chloride flush   SUBJECTIVE: He says that he feels perfectly normal.  Had any fever, chills, sweats or shortness of breath.  Review  of Systems: Review of Systems  Constitutional: Negative for chills, diaphoresis, fever and malaise/fatigue.  HENT: Negative for congestion and sore throat.   Respiratory: Negative for cough, sputum production and shortness of breath.   Cardiovascular: Negative for chest pain.  Gastrointestinal: Negative for abdominal pain, diarrhea, nausea and vomiting.       He denies any perirectal pain.    Genitourinary: Negative for dysuria.  Musculoskeletal: Negative for back pain and joint pain.  Skin: Negative for rash.  Neurological: Negative for headaches.    Allergies  Allergen Reactions  . Shellfish Allergy     " I BREAK OUT IN HIVES "    OBJECTIVE: Vitals:   05/29/18 1408 05/29/18 2027 05/29/18 2329 05/30/18 0415  BP: 126/71 (!) 145/72 135/76 (!) 122/57  Pulse: 79 91 85 84  Resp: 20 18 19 19   Temp: 98.8 F (37.1 C) 99.8 F (37.7 C) 98.7 F (37.1 C) 98.3 F (36.8 C)  TempSrc:  Oral Oral Oral  SpO2: 98% 100% 96% 97%  Weight:      Height:       Body mass index is 26.04 kg/m.  Physical Exam  Constitutional: He is oriented to person, place, and time.  He is resting comfortably in bed watching television.  He is bored.  HENT:  Mouth/Throat: No oropharyngeal exudate.  Poor dentition.  Eyes: Conjunctivae are normal.  Neck: Neck supple.  Cardiovascular: Normal rate, regular rhythm and normal heart sounds.  No murmur heard. Pulmonary/Chest: Effort normal and breath sounds normal.  Abdominal: Soft. He exhibits no mass. There is no tenderness.  Genitourinary:  Genitourinary Comments: He has no perirectal swelling or tenderness.  Musculoskeletal: Normal range of motion. He exhibits no edema or tenderness.  Neurological: He is alert and oriented to person, place, and time.  Skin: No rash noted.  Psychiatric: He has a normal mood and affect.    Lab Results Lab Results  Component Value Date   WBC 0.9 (LL) 05/30/2018   HGB 7.6 (L) 05/30/2018   HCT 21.9 (L) 05/30/2018    MCV 85.2 05/30/2018   PLT <5 (LL) 05/30/2018    Lab Results  Component Value Date   CREATININE 0.72 05/29/2018   BUN 12 05/25/2018   NA 138 05/25/2018   K 3.3 (L) 05/25/2018   CL 100 05/25/2018   CO2 26 05/25/2018    Lab Results  Component Value Date   ALT 12 05/25/2018   AST 15 05/25/2018   ALKPHOS 65 05/25/2018   BILITOT 0.6 05/25/2018     Microbiology: Recent Results (from the past 240 hour(s))  Culture, Urine     Status: Abnormal   Collection Time: 05/20/18 11:51 AM  Result Value Ref Range Status   Specimen Description   Final    URINE, CLEAN CATCH Performed at [facility], [place] [address], [place], [place] [address]    Special Requests   Final    NONE Performed at [facility], [place] [address], [place], [place] [address]    Culture (A)  Final    <10,000 COLONIES/mL INSIGNIFICANT GROWTH Performed at [place] [address] [place], [place] [address]    Report Status 05/21/2018 FINAL  Final  Culture, Blood     Status: None   Collection Time: 05/20/18 12:00 PM  Result Value Ref Range Status   Specimen Description   Final    LEFT ANTECUBITAL Performed at [facility], [place] [address] [place], [place] [address]    Special Requests   Final    BOTTLES DRAWN AEROBIC AND ANAEROBIC Blood Culture adequate volume   Culture   Final    NO GROWTH 5 DAYS Performed at [facility], [place] [address], [place], [place] [address]    Report Status 05/25/2018 FINAL  Final  Culture, Blood     Status: None   Collection Time: 05/20/18 12:15 PM  Result Value Ref Range Status   Specimen Description   Final    BLOOD LEFT ARM Performed at [facility], [place] [address], [place], [place] [address]    Special Requests   Final    BOTTLES DRAWN AEROBIC AND ANAEROBIC Blood Culture adequate volume   Culture   Final    NO GROWTH 5 DAYS Performed at [facility],  [place] [address], [place], [place] [address]    Report Status 05/25/2018 FINAL  Final  Culture, blood (routine x 2)     Status: None   Collection Time: 05/24/18  4:09 PM  Result Value Ref Range Status   Specimen Description   Final    BLOOD LEFT ANTECUBITAL Performed at [place] [address], [place], [place] [address]    Special Requests   Final    BOTTLES DRAWN AEROBIC AND ANAEROBIC Blood  Culture adequate volume Performed at [place] [address], [place], [place] [address]    Culture   Final    NO GROWTH 5 DAYS Performed at [facility], [place] [address], [place], [place] [address]    Report Status 05/29/2018 FINAL  Final  Culture, blood (routine x 2)     Status: None   Collection Time: 05/24/18  4:10 PM  Result Value Ref Range Status   Specimen Description   Final    BLOOD RIGHT HAND Performed at [facility], [place] [address] [place], [place] [address]    Special Requests   Final    BOTTLES DRAWN AEROBIC AND ANAEROBIC Blood Culture adequate volume Performed at [place] [address] [place], [place] [address]    Culture   Final    NO GROWTH 5 DAYS Performed at [facility], [place] [address], [place], [place] [address]    Report Status 05/29/2018 FINAL  Final   Maxillofacial CT scan 05/26/2018  IMPRESSION: Positive for periodontal disease involving the root of the left second maxillary molar, and the right maxillary lateral incisor.  Sinuses are clear.   Electronically Signed   By: Jacqulynn Cadet M.D.   On: 05/26/2018 12:49    Michel Bickers, [place] [facility] [phone] pager   [phone] cell 05/30/2018, 8:52 AM

## 2018-05-31 ENCOUNTER — Other Ambulatory Visit: Payer: Self-pay | Admitting: Nurse Practitioner

## 2018-05-31 LAB — CBC WITH DIFFERENTIAL/PLATELET
BASOS ABS: 0 10*3/uL (ref 0.0–0.1)
Basophils Relative: 3 %
EOS ABS: 0 10*3/uL (ref 0.0–0.7)
Eosinophils Relative: 0 %
HCT: 19.4 % — ABNORMAL LOW (ref 39.0–52.0)
Hemoglobin: 6.7 g/dL — CL (ref 13.0–17.0)
LYMPHS ABS: 0.5 10*3/uL — AB (ref 0.7–4.0)
Lymphocytes Relative: 69 %
MCH: 29.6 pg (ref 26.0–34.0)
MCHC: 34.5 g/dL (ref 30.0–36.0)
MCV: 85.8 fL (ref 78.0–100.0)
MONO ABS: 0.1 10*3/uL (ref 0.1–1.0)
MONOS PCT: 15 %
Neutro Abs: 0.1 10*3/uL — ABNORMAL LOW (ref 1.7–7.7)
Neutrophils Relative %: 13 %
PLATELETS: 8 10*3/uL — AB (ref 150–400)
RBC: 2.26 MIL/uL — AB (ref 4.22–5.81)
RDW: 14.3 % (ref 11.5–15.5)
WBC: 0.7 10*3/uL — AB (ref 4.0–10.5)

## 2018-05-31 LAB — PREPARE RBC (CROSSMATCH)

## 2018-05-31 MED ORDER — CHLORHEXIDINE GLUCONATE 0.12 % MT SOLN: 10.0000 mL | Freq: Three times a day (TID) | OROMUCOSAL | Status: DC

## 2018-05-31 MED ORDER — SODIUM CHLORIDE 0.9% IV SOLUTION: 250.0000 mL | Freq: Once | INTRAVENOUS | Status: DC

## 2018-05-31 MED ORDER — HEPARIN SOD (PORK) LOCK FLUSH 100 UNIT/ML IV SOLN
500.0000 [IU] | Freq: Every day | INTRAVENOUS | Status: DC | PRN
  Filled 2018-05-31: qty 5

## 2018-05-31 MED ORDER — FUROSEMIDE 10 MG/ML IJ SOLN
20.0000 mg | Freq: Once | INTRAMUSCULAR | Status: AC
  Administered 2018-05-31: 20 mg via INTRAVENOUS
  Filled 2018-05-31: qty 2

## 2018-05-31 MED ORDER — ACETAMINOPHEN 325 MG PO TABS
650.0000 mg | ORAL_TABLET | Freq: Once | ORAL | Status: AC
  Administered 2018-05-31: 650 mg via ORAL
  Filled 2018-05-31: qty 2

## 2018-05-31 MED ORDER — DIPHENHYDRAMINE HCL 25 MG PO CAPS
25.0000 mg | ORAL_CAPSULE | Freq: Once | ORAL | Status: AC
  Administered 2018-05-31: 25 mg via ORAL
  Filled 2018-05-31: qty 1

## 2018-05-31 MED ORDER — HEPARIN SOD (PORK) LOCK FLUSH 100 UNIT/ML IV SOLN
250.0000 [IU] | INTRAVENOUS | Status: DC | PRN
  Filled 2018-05-31: qty 2.5

## 2018-05-31 MED ORDER — METHYLPREDNISOLONE SODIUM SUCC 40 MG IJ SOLR
40.0000 mg | Freq: Once | INTRAMUSCULAR | Status: AC
  Administered 2018-05-31: 40 mg via INTRAVENOUS
  Filled 2018-05-31: qty 1

## 2018-05-31 MED ORDER — SODIUM CHLORIDE 0.9% FLUSH: 10.0000 mL | INTRAVENOUS | Status: DC | PRN

## 2018-05-31 MED ORDER — SODIUM CHLORIDE 0.9% FLUSH: 3.0000 mL | INTRAVENOUS | Status: DC | PRN

## 2018-05-31 MED ORDER — DIPHENHYDRAMINE HCL 25 MG PO CAPS
25.0000 mg | ORAL_CAPSULE | Freq: Once | ORAL | Status: AC
  Administered 2018-06-01: 25 mg via ORAL
  Filled 2018-05-31: qty 1

## 2018-05-31 NOTE — Progress Notes (Signed)
Pt is diaphoretic temps obtain see flow sheet. Call placed to medical oncology cross cover Dr. Irene Limbo made aware of pt's temp 97.4 oral. MD states pt temp will be concerning once 100.4 PO and above. Orders to administer 2nd unit PLT and premedicate with Tylenol 650 PO and benadryl 25 mg PO at this time.

## 2018-05-31 NOTE — Progress Notes (Signed)
Andrew [name]   HEMATOLOGY/ONCOLOGY INPATIENT PROGRESS NOTE  Date of Service: 05/31/2018  Inpatient Attending: .Ladell Pier, MD   SUBJECTIVE  Patient notes no acute new symptoms. Slept well with trazodone last night. Still with significant cytopenias. Receiving 2 units of PRBC and 2 units of PLTs today.  No other new focal symptoms. No fevers for 48h at this time. Discussed that if he remained afebrile will consider discontinuation of antimicrobials tomorrow.    OBJECTIVE:  NAD  PHYSICAL EXAMINATION: . Vitals:   05/30/18 2212 05/31/18 0556 05/31/18 1101 05/31/18 1131  BP: (!) 147/77 133/69 135/71 128/63  Pulse: 97 85 98 84  Resp:  (!) 22 (!) 22 (!) 24  Temp:  99.3 F (37.4 C) 99.9 F (37.7 C) 99.3 F (37.4 C)  TempSrc:   Oral Oral  SpO2:  94% 97% 97%  Weight:      Height:       Filed Weights   05/27/18 0735  Weight: 166 lb 4.8 oz (75.4 kg)   .Body mass index is 26.04 kg/m. Andrew KitchenTemp (48hrs), Avg:99 F (37.2 C), Min:98.2 F (36.8 C), Max:99.9 F (37.7 C)   GENERAL:alert, in no acute distress and comfortable SKIN: skin color, texture, turgor are normal, no rashes or significant lesions EYES: normal, conjunctiva are pink and non-injected, sclera clear OROPHARYNX:no exudate, no erythema and lips, buccal mucosa, and tongue normal  NECK: supple, no JVD, thyroid normal size, non-tender, without nodularity LYMPH:  no palpable lymphadenopathy in the cervical, axillary or inguinal LUNGS: clear to auscultation with normal respiratory effort HEART: regular rate & rhythm,  no murmurs and no lower extremity edema ABDOMEN: abdomen soft, non-tender, normoactive bowel sounds  Musculoskeletal: no cyanosis of digits and no clubbing  PSYCH: alert & oriented x 3 with fluent speech NEURO: no focal motor/sensory deficits  MEDICAL HISTORY:  Past Medical History:  Diagnosis Date  . Abdominal bloating    probable lactose intolorence  . Anemia 04/03/2018  . Asthma   . COPD (chronic  obstructive pulmonary disease) (HCC)    mild improvement  . DM (diabetes mellitus) ([place])   . Hearing impairment    asymptomatic  . HTN (hypertension)   . Tobacco use   . Vitamin D deficiency     SURGICAL HISTORY: Past Surgical History:  Procedure Laterality Date  . BIOPSY  04/03/2018   Procedure: BIOPSY;  Surgeon: Otis Brace, MD;  Location: [facility];  Service: Gastroenterology;;  . COLONOSCOPY WITH PROPOFOL N/A 04/04/2018   Procedure: COLONOSCOPY WITH PROPOFOL;  Surgeon: Otis Brace, MD;  Location: [facility];  Service: Gastroenterology;  Laterality: N/A;  . ESOPHAGOGASTRODUODENOSCOPY (EGD) WITH PROPOFOL N/A 04/03/2018   Procedure: ESOPHAGOGASTRODUODENOSCOPY (EGD) WITH PROPOFOL;  Surgeon: Otis Brace, MD;  Location[facility];  Service: Gastroenterology;  Laterality: N/A;  . EYE SURGERY      SOCIAL HISTORY: Social History   Socioeconomic History  . Marital status: Single    Spouse name: Not on file  . Number of children: 1  . Years of education: 41  . Highest education level: Bachelor's degree (e.g., BA, AB, BS)  Occupational History  . Occupation: retired  Scientific laboratory technician  . Financial resource strain: Not hard at all  . Food insecurity:    Worry: Never true    Inability: Never true  . Transportation needs:    Medical: No    Non-medical: No  Tobacco Use  . Smoking status: Former Smoker    Packs/day: 0.25    Types: Cigarettes    Last attempt  to quit: 03/02/2018    Years since quitting: 0.2  . Smokeless tobacco: Never Used  Substance and Sexual Activity  . Alcohol use: Yes    Frequency: Never    Comment: occ  . Drug use: Never  . Sexual activity: Not on file  Lifestyle  . Physical activity:    Days per week: 0 days    Minutes per session: 0 min  . Stress: Not at all  Relationships  . Social connections:    Talks on phone: More than three times a week    Gets together: More than three times a week    Attends religious service: More than  4 times per year    Active member of club or organization: Yes    Attends meetings of clubs or organizations: More than 4 times per year    Relationship status: Divorced  . Intimate partner violence:    Fear of current or ex partner: Not on file    Emotionally abused: Not on file    Physically abused: Not on file    Forced sexual activity: Not on file  Other Topics Concern  . Not on file  Social History Narrative  . Not on file    FAMILY HISTORY: History reviewed. No pertinent family history.  ALLERGIES:  is allergic to shellfish allergy.  MEDICATIONS:  Scheduled Meds: . sodium chloride  250 mL Intravenous Once  . amLODipine  5 mg Oral Daily  . chlorhexidine  15 mL Mouth/Throat TID  . dorzolamide-timolol  1 drop Both Eyes BID  . latanoprost  1 drop Both Eyes QHS  . metoprolol tartrate  25 mg Oral BID  . montelukast  10 mg Oral QHS  . pantoprazole  40 mg Oral BID  . senna-docusate  2 tablet Oral BID  . Tbo-Filgrastim  480 mcg Subcutaneous Daily   Continuous Infusions: . sodium chloride 50 mL/hr at 05/31/18 0600  . anidulafungin 100 mg (05/30/18 2256)  . meropenem (MERREM) IV 1 g (05/31/18 0553)  . vancomycin 1,250 mg (05/31/18 0757)   PRN Meds:.acetaminophen, albuterol, heparin lock flush, heparin lock flush, prochlorperazine, sodium chloride flush, sodium chloride flush, sodium chloride flush  REVIEW OF SYSTEMS:    10 Point review of Systems was done is negative except as noted above.   LABORATORY DATA:  I have reviewed the data as listed  . CBC Latest Ref Rng & Units 05/31/2018 05/30/2018 05/30/2018  WBC 4.0 - 10.5 K/uL 0.7(LL) - 0.9(LL)  Hemoglobin 13.0 - 17.0 g/dL 6.7(LL) - 7.6(L)  Hematocrit 39.0 - 52.0 % 19.4(L) - 21.9(L)  Platelets 150 - 400 K/uL 8(LL) 10(LL) <5(LL)    . CMP Latest Ref Rng & Units 05/29/2018 05/25/2018 05/24/2018  Glucose 70 - 99 mg/dL - 111(H) -  BUN 8 - 23 mg/dL - 12 -  Creatinine 0.61 - 1.24 mg/dL 0.72 0.69 0.83  Sodium 135 - 145 mmol/L -  138 -  Potassium 3.5 - 5.1 mmol/L - 3.3(L) -  Chloride 98 - 111 mmol/L - 100 -  CO2 22 - 32 mmol/L - 26 -  Calcium 8.9 - 10.3 mg/dL - 8.6(L) -  Total Protein 6.5 - 8.1 g/dL - 7.2 -  Total Bilirubin 0.3 - 1.2 mg/dL - 0.6 -  Alkaline Phos 38 - 126 U/L - 65 -  AST 15 - 41 U/L - 15 -  ALT 0 - 44 U/L - 12 -     RADIOGRAPHIC STUDIES: I have personally reviewed the radiological images as listed  and agreed with the findings in the report. Dg Chest 2 View  Result Date: 05/24/2018 CLINICAL DATA:  Neutropenic fever EXAM: CHEST - 2 VIEW COMPARISON:  Four days ago FINDINGS: Persistent generalized interstitial coarsening that appears bronchitic. No Kerley lines or effusion. No air bronchogram. Normal heart size and mediastinal contours. There is right upper extremity PICC with tip at the upper cavoatrial junction. IMPRESSION: Chronic bronchitic markings. No focal pneumonia or change from prior. Electronically Signed   By: Monte Fantasia M.D.   On: 05/24/2018 11:32   X-ray Chest Pa And Lateral  Result Date: 05/20/2018 CLINICAL DATA:  Fever this morning. EXAM: CHEST - 2 VIEW COMPARISON:  Chest CT April 02, 2018 and chest radiograph April 02, 2018. FINDINGS: Cardiac silhouette is upper limits of normal size. Calcified aortic arch. Mild bronchitic changes without pleural effusion or focal consolidation. Mild hyperinflation with flattened hemidiaphragms. No pneumothorax. Biapical pleural thickening. Soft tissue planes and included osseous structures are non suspicious. IMPRESSION: 1. Mild bronchitic changes and mild hyperinflation without focal consolidation. 2. Borderline cardiomegaly. 3.  Aortic Atherosclerosis (ICD10-I70.0). Electronically Signed   By: Elon Alas M.D.   On: 05/20/2018 14:29   Ir Picc Placement Right >5 Yrs Inc Img Guide  Result Date: 05/22/2018 INDICATION: Anemia. EXAM: RIGHT UPPER EXTREMITY PICC LINE PLACEMENT WITH ULTRASOUND AND FLUOROSCOPIC GUIDANCE MEDICATIONS: None  ANESTHESIA/SEDATION: None FLUOROSCOPY TIME:  Fluoroscopy Time:  minutes 12 seconds (1 mGy). COMPLICATIONS: None immediate. PROCEDURE: The patient was advised of the possible risks and complications and agreed to undergo the procedure. The patient was then brought to the angiographic suite for the procedure. The right arm was prepped with chlorhexidine, draped in the usual sterile fashion using maximum barrier technique (cap and mask, sterile gown, sterile gloves, large sterile sheet, hand hygiene and cutaneous antiseptic). Local anesthesia was attained by infiltration with 1% lidocaine. Ultrasound demonstrated patency of the basilic vein, and this was documented with an image. Under real-time ultrasound guidance, this vein was accessed with a 21 gauge micropuncture needle and image documentation was performed. The needle was exchanged over a guidewire for a peel-away sheath through which a 40 cm 5 [place] single lumen power injectable PICC was advanced, and positioned with its tip at the lower SVC/right atrial junction. Fluoroscopy during the procedure and fluoro spot radiograph confirms appropriate catheter position. The catheter was flushed, secured to the skin with Prolene sutures, and covered with a sterile dressing. IMPRESSION: Successful placement of a right arm PICC with sonographic and fluoroscopic guidance. The catheter is ready for use. Electronically Signed   By: Marybelle Killings M.D.   On: 05/22/2018 17:19   Ct Maxillofacial Wo Contrast  Result Date: 05/26/2018 CLINICAL DATA:  67 year old male with right upper jaw pain and persistent fever EXAM: CT MAXILLOFACIAL WITHOUT CONTRAST TECHNIQUE: Multidetector CT imaging of the maxillofacial structures was performed. Multiplanar CT image reconstructions were also generated. COMPARISON:  None. FINDINGS: Osseous: No fracture or mandibular dislocation. No destructive process. Periapical lucency present surrounding the root of the left second maxillary molar  consistent with periodontal disease. Additional smaller periapical lucencies present at the right maxillary lateral incisor. Orbits: Negative. No traumatic or inflammatory finding. Sinuses: Clear. Soft tissues: Negative. Limited intracranial: No significant or unexpected finding. IMPRESSION: Positive for periodontal disease involving the root of the left second maxillary molar, and the right maxillary lateral incisor. Sinuses are clear. Electronically Signed   By: Jacqulynn Cadet M.D.   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 S/p Cycle 1 Vidaza 05/06/2018  2.Renal insufficiency - creatinine - now normalized. -monitor  3.Diabetes - diet controled  4.Hemoccult positive stool  Upper endoscopy 04/03/2018-chronic gastritis, duodenitis  Colonoscopy 04/04/2018-diverticulosis in the sigmoid colon   5.Right lower lobe 7 mm subpleural nodule on chest CT 04/02/2018  6.Febrile neutropenia 05/20/2018- placed on cefepime, antibiotics changed to vancomycin/meropenem on 05/21/2018, anidulafungin added 05/25/2018  7.  Severe neutropenia- G-CSF started 05/22/2018, dose increased 05/26/2018 8.  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  Febrile reaction following a platelet transfusion 05/29/2018 PLAN -labs reviewed with patient today. Still with profound cytopenias -Transfuse 2 units of PRBC's and 2 units of PLTs with premedications. All products irradiated. ABO matched platelets due to refractoriness. -continue G-CSF at this time. -no fevers for 48 hours .Temp (48hrs),  Avg:99 F (37.2 C), Min:98.2 F (36.8 C), Max:99.9 F (37.7 C) -will consider discontinuing antibiotics tomorrow if no further fevers today -on peridex for periodontal disease.  I spent 25 minutes counseling the patient face to face. The total time spent in the appointment was 35 minutes and more than 50% was on counseling and direct patient cares.    Sullivan Lone MD [place] AAHIVMS [facility] Hematology/Oncology Physician [facility]  (Office):       [phone] (Work cell):  [phone] (Fax):           [phone]  05/31/2018 12:01 PM

## 2018-05-31 NOTE — Progress Notes (Signed)
INFECTIOUS DISEASE PROGRESS NOTE  ID: Andrew [name] is a 67 y.o. male with  Principal Problem:   Febrile neutropenia ([place]) Active Problems:   HTN (hypertension)   COPD (chronic obstructive pulmonary disease) (HCC)   Asthma   DM (diabetes mellitus) ([place])   Tobacco use   Pancytopenia ([place])   Myelodysplasia (myelodysplastic syndrome) ([place])   Renal insufficiency   Atherosclerosis of aorta (HCC)   Poor dentition  Subjective: Up in bed eating milkshake.  No dysphagia.  No c/o  Abtx:  Anti-infectives (From admission, onward)   Start     Dose/Rate Route Frequency Ordered Stop   05/30/18 0800  vancomycin (VANCOCIN) 1,250 mg in sodium chloride 0.9 % 250 mL IVPB     1,250 mg 166.7 mL/hr over 90 Minutes Intravenous Every 12 hours 05/30/18 0638     05/27/18 0600  vancomycin (VANCOCIN) IVPB 1000 mg/200 mL premix  Status:  Discontinued     1,000 mg 200 mL/hr over 60 Minutes Intravenous Every 12 hours 05/27/18 0001 05/30/18 0638   05/25/18 2000  anidulafungin (ERAXIS) 100 mg in sodium chloride 0.9 % 100 mL IVPB     100 mg 78 mL/hr over 100 Minutes Intravenous Every 24 hours 05/24/18 1819     05/24/18 2000  anidulafungin (ERAXIS) 200 mg in sodium chloride 0.9 % 200 mL IVPB     200 mg 78 mL/hr over 200 Minutes Intravenous  Once 05/24/18 1819 05/25/18 0135   05/22/18 2000  vancomycin (VANCOCIN) 1,250 mg in sodium chloride 0.9 % 250 mL IVPB  Status:  Discontinued     1,250 mg 166.7 mL/hr over 90 Minutes Intravenous Every 24 hours 05/21/18 1951 05/27/18 0001   05/21/18 2200  meropenem (MERREM) 1 g in sodium chloride 0.9 % 100 mL IVPB     1 g 200 mL/hr over 30 Minutes Intravenous Every 8 hours 05/21/18 1932     05/21/18 2000  vancomycin (VANCOCIN) 1,500 mg in sodium chloride 0.9 % 500 mL IVPB     1,500 mg 250 mL/hr over 120 Minutes Intravenous  Once 05/21/18 1936 05/21/18 2244   05/21/18 1945  vancomycin (VANCOCIN) IVPB 750 mg/150 ml premix  Status:  Discontinued     750 mg 150 mL/hr  over 60 Minutes Intravenous Every 12 hours 05/21/18 1932 05/21/18 1936   05/20/18 2200  ceFEPIme (MAXIPIME) 2 g in sodium chloride 0.9 % 100 mL IVPB  Status:  Discontinued     2 g 200 mL/hr over 30 Minutes Intravenous Every 8 hours 05/20/18 1347 05/21/18 1932      Medications:  Scheduled: . sodium chloride  250 mL Intravenous Once  . amLODipine  5 mg Oral Daily  . chlorhexidine  15 mL Mouth/Throat TID  . dorzolamide-timolol  1 drop Both Eyes BID  . latanoprost  1 drop Both Eyes QHS  . metoprolol tartrate  25 mg Oral BID  . montelukast  10 mg Oral QHS  . pantoprazole  40 mg Oral BID  . senna-docusate  2 tablet Oral BID  . Tbo-Filgrastim  480 mcg Subcutaneous Daily    Objective: Vital signs in last 24 hours: Temp:  [97.8 F (36.6 C)-99.9 F (37.7 C)] 98.1 F (36.7 C) (09/07 1539) Pulse Rate:  [67-98] 72 (09/07 1539) Resp:  [20-24] 24 (09/07 1539) BP: (118-147)/(63-77) 123/70 (09/07 1539) SpO2:  [94 %-100 %] 98 % (09/07 1539)   General appearance: alert, cooperative and no distress Resp: clear to auscultation bilaterally Cardio: regular rate and rhythm GI: normal  findings: bowel sounds normal and soft, non-tender Extremities: RUE PIC is non-tender.   Lab Results Recent Labs    05/29/18 0619 05/30/18 0456 05/31/18 0334  WBC 0.6* 0.9* 0.7*  HGB 8.4* 7.6* 6.7*  HCT 24.1* 21.9* 19.4*  CREATININE 0.72  --   --    Liver Panel No results for input(s): PROT, ALBUMIN, AST, ALT, ALKPHOS, BILITOT, BILIDIR, IBILI in the last 72 hours. Sedimentation Rate No results for input(s): ESRSEDRATE in the last 72 hours. C-Reactive Protein No results for input(s): CRP in the last 72 hours.  Microbiology: Recent Results (from the past 240 hour(s))  Culture, blood (routine x 2)     Status: None   Collection Time: 05/24/18  4:09 PM  Result Value Ref Range Status   Specimen Description   Final    BLOOD LEFT ANTECUBITAL Performed at [facility] [address] [place], [place] [address]    Special Requests   Final    BOTTLES DRAWN AEROBIC AND ANAEROBIC Blood Culture adequate volume Performed at [facility] [address], [place], [place] [address]    Culture   Final    NO GROWTH 5 DAYS Performed at [facility], [place] [address], [place], [place] [address]    Report Status 05/29/2018 FINAL  Final  Culture, blood (routine x 2)     Status: None   Collection Time: 05/24/18  4:10 PM  Result Value Ref Range Status   Specimen Description   Final    BLOOD RIGHT HAND Performed at [facility], [place] [address], [place], [place] [address]    Special Requests   Final    BOTTLES DRAWN AEROBIC AND ANAEROBIC Blood Culture adequate volume Performed at [facility] [address], [place], [place] [address]    Culture   Final    NO GROWTH 5 DAYS Performed at [facility], [place] [address], [place], [place] [address]    Report Status 05/29/2018 FINAL  Final    Studies/Results: No results found.   Assessment/Plan: Myelodysplasia Pancytopenia Chemotherapy 05-06-18 (Vidaza) Febrile neutropenia  Total days of antibiotics:  8-27 cefepime -->     8-28 vanco/merrem      9-1 "     " andulafungin  GCSF 8-29  Would stop his anbx as he has been afebrile x 48h He is still neutropenic (ANC 100) Getting PLT txf today.  His Cx are all (-).  Will watch on 9-8         Bobby Rumpf MD, FACP Infectious Diseases (pager) [phone] [email or web address] 05/31/2018, 4:10 PM  LOS: 11 days

## 2018-05-31 NOTE — Progress Notes (Addendum)
Call received from lab pt noted with Critical Hgb 6.7 WBC 0.7 PLTs 8,000  [facility] made aware states he will order transfusions.

## 2018-06-01 DIAGNOSIS — D709 Neutropenia, unspecified: Secondary | ICD-10-CM

## 2018-06-01 DIAGNOSIS — K911 Postgastric surgery syndromes: Secondary | ICD-10-CM

## 2018-06-01 DIAGNOSIS — E119 Type 2 diabetes mellitus without complications: Secondary | ICD-10-CM

## 2018-06-01 DIAGNOSIS — R5081 Fever presenting with conditions classified elsewhere: Secondary | ICD-10-CM

## 2018-06-01 DIAGNOSIS — K921 Melena: Secondary | ICD-10-CM

## 2018-06-01 LAB — BPAM RBC
BLOOD PRODUCT EXPIRATION DATE: 201910052359
BLOOD PRODUCT EXPIRATION DATE: 201910052359
Blood Product Expiration Date: 201909272359
ISSUE DATE / TIME: 201909071740
ISSUE DATE / TIME: 201909041812
ISSUE DATE / TIME: 201909071115
UNIT TYPE AND RH: 5100
Unit Type and Rh: 5100
Unit Type and Rh: 5100

## 2018-06-01 LAB — TYPE AND SCREEN
ABO/RH(D): O POS
ANTIBODY SCREEN: NEGATIVE
UNIT DIVISION: 0
Unit division: 0
Unit division: 0

## 2018-06-01 LAB — PREPARE PLATELET PHERESIS
UNIT DIVISION: 0
Unit division: 0

## 2018-06-01 LAB — CBC WITH DIFFERENTIAL/PLATELET
Basophils Absolute: 0 10*3/uL (ref 0.0–0.1)
Basophils Relative: 6 %
EOS PCT: 0 %
Eosinophils Absolute: 0 10*3/uL (ref 0.0–0.7)
HCT: 25.2 % — ABNORMAL LOW (ref 39.0–52.0)
Hemoglobin: 8.7 g/dL — ABNORMAL LOW (ref 13.0–17.0)
LYMPHS ABS: 0.5 10*3/uL (ref 0.7–4.0)
Lymphocytes Relative: 72 %
MCH: 29.2 pg (ref 26.0–34.0)
MCHC: 34.5 g/dL (ref 30.0–36.0)
MCV: 84.6 fL (ref 78.0–100.0)
MONO ABS: 0.1 10*3/uL (ref 0.1–1.0)
Monocytes Relative: 10 %
Neutro Abs: 0.1 10*3/uL (ref 1.7–7.7)
Neutrophils Relative %: 12 %
PLATELETS: 53 10*3/uL — AB (ref 150–400)
RBC: 2.98 MIL/uL — ABNORMAL LOW (ref 4.22–5.81)
RDW: 13.9 % (ref 11.5–15.5)
WBC: 0.7 10*3/uL — CL (ref 4.0–10.5)

## 2018-06-01 LAB — COMPREHENSIVE METABOLIC PANEL
ALT: 15 U/L (ref 0–44)
AST: 14 U/L — ABNORMAL LOW (ref 15–41)
Albumin: 2.4 g/dL — ABNORMAL LOW (ref 3.5–5.0)
Alkaline Phosphatase: 84 U/L (ref 38–126)
Anion gap: 8 (ref 5–15)
BUN: 15 mg/dL (ref 8–23)
CHLORIDE: 106 mmol/L (ref 98–111)
CO2: 28 mmol/L (ref 22–32)
CREATININE: 0.55 mg/dL — AB (ref 0.61–1.24)
Calcium: 8.8 mg/dL — ABNORMAL LOW (ref 8.9–10.3)
Glucose, Bld: 152 mg/dL — ABNORMAL HIGH (ref 70–99)
Potassium: 3.9 mmol/L (ref 3.5–5.1)
Sodium: 142 mmol/L (ref 135–145)
TOTAL PROTEIN: 7.2 g/dL (ref 6.5–8.1)
Total Bilirubin: 1 mg/dL (ref 0.3–1.2)

## 2018-06-01 LAB — BPAM PLATELET PHERESIS
Blood Product Expiration Date: 201909052359
Blood Product Expiration Date: 201909062359
ISSUE DATE / TIME: 201909050327
ISSUE DATE / TIME: 201909061227
UNIT TYPE AND RH: 8400
Unit Type and Rh: 5100

## 2018-06-01 LAB — LACTATE DEHYDROGENASE: LDH: 157 U/L (ref 98–192)

## 2018-06-01 NOTE — Progress Notes (Signed)
Marland Kitchen   HEMATOLOGY/ONCOLOGY INPATIENT PROGRESS NOTE  Date of Service: 06/01/2018  Inpatient Attending: .Ladell Pier, MD   SUBJECTIVE  Patient notes he is feeling better today. No new fevers overnight. Tolerating PRBC and PLT transfusions without any issues yesterday. PLT and hgb much improved this AM.  Holding antibiotics. No other new focal symptoms. Eating better.  OBJECTIVE:  NAD  PHYSICAL EXAMINATION: . Vitals:   06/01/18 0038 06/01/18 0236 06/01/18 0443 06/01/18 1453  BP: 112/75 (!) 106/57 117/63 137/74  Pulse: 63 60 62 76  Resp:  _0 Temp: (!) 97.4 F (36.3 C) (!) 97.4 F (36.3 C) (!) 97.5 F (36.4 C) 98.6 F (37 C)  TempSrc: Oral Oral Oral Oral  SpO2: 98% 98% 95% 99%  Weight:      Height:       Filed Weights   05/27/18 0735  Weight: 166 lb 4.8 oz (75.4 kg)   .Body mass index is 26.04 kg/m. Marland KitchenTemp (72hrs), Avg:98.4 F (36.9 C), Min:97.4 F (36.3 C), Max:99.9 F (37.7 C)  GENERAL:alert, in no acute distress and comfortable EYES: normal, conjunctiva are pink and non-injected, sclera clear OROPHARYNX:poor dentition. No thrush.  NECK: supple, no JVD LYMPH:  no palpable lymphadenopathy in the cervical, axillary or inguinal LUNGS: clear to auscultation with normal respiratory effort. HEART: regular rate & rhythm. ABDOMEN: abdomen soft, non-tender, normoactive bowel sounds. Musculoskeletal: trace pedal edema PSYCH: alert & oriented x 3 with fluent speech NEURO: no focal motor/sensory deficits  MEDICAL HISTORY:  Past Medical History:  Diagnosis Date  . Abdominal bloating    probable lactose intolorence  . Anemia 04/03/2018  . Asthma   . COPD (chronic obstructive pulmonary disease) (HCC)    mild improvement  . DM (diabetes mellitus) ([place])   . Hearing impairment    asymptomatic  . HTN (hypertension)   . Tobacco use   . Vitamin D deficiency     SURGICAL HISTORY: Past Surgical History:  Procedure Laterality Date  . BIOPSY  04/03/2018   Procedure: BIOPSY;  Surgeon: Otis Brace, MD;  Location: [place];  Service: Gastroenterology;;  . COLONOSCOPY WITH PROPOFOL N/A 04/04/2018   Procedure: COLONOSCOPY WITH PROPOFOL;  Surgeon: Otis Brace, MD;  Location: [place];  Service: Gastroenterology;  Laterality: N/A;  . ESOPHAGOGASTRODUODENOSCOPY (EGD) WITH PROPOFOL N/A 04/03/2018   Procedure: ESOPHAGOGASTRODUODENOSCOPY (EGD) WITH PROPOFOL;  Surgeon: Otis Brace, MD;  Location: [facility];  Service: Gastroenterology;  Laterality: N/A;  . EYE SURGERY      SOCIAL HISTORY: Social History   Socioeconomic History  . Marital status: Single    Spouse name: Not on file  . Number of children: 1  . Years of education: 79  . Highest education level: Bachelor's degree (e.g., BA, AB, BS)  Occupational History  . Occupation: retired  Scientific laboratory technician  . Financial resource strain: Not hard at all  . Food insecurity:    Worry: Never true    Inability: Never true  . Transportation needs:    Medical: No    Non-medical: No  Tobacco Use  . Smoking status: Former Smoker    Packs/day: 0.25    Types: Cigarettes    Last attempt to quit: 03/02/2018    Years since quitting: 0.2  . Smokeless tobacco: Never Used  Substance and Sexual Activity  . Alcohol use: Yes    Frequency: Never    Comment: occ  . Drug use: Never  . Sexual activity: Not on file  Lifestyle  . Physical activity:  Days per week: 0 days    Minutes per session: 0 min  . Stress: Not at all  Relationships  . Social connections:    Talks on phone: More than three times a week    Gets together: More than three times a week    Attends religious service: More than 4 times per year    Active member of club or organization: Yes    Attends meetings of clubs or organizations: More than 4 times per year    Relationship status: Divorced  . Intimate partner violence:    Fear of current or ex partner: Not on file    Emotionally abused: Not on file     Physically abused: Not on file    Forced sexual activity: Not on file  Other Topics Concern  . Not on file  Social History Narrative  . Not on file    FAMILY HISTORY: History reviewed. No pertinent family history.  ALLERGIES:  is allergic to shellfish allergy.  MEDICATIONS:  Scheduled Meds: . sodium chloride  250 mL Intravenous Once  . amLODipine  5 mg Oral Daily  . chlorhexidine  15 mL Mouth/Throat TID  . dorzolamide-timolol  1 drop Both Eyes BID  . latanoprost  1 drop Both Eyes QHS  . metoprolol tartrate  25 mg Oral BID  . montelukast  10 mg Oral QHS  . pantoprazole  40 mg Oral BID  . senna-docusate  2 tablet Oral BID  . Tbo-Filgrastim  480 mcg Subcutaneous Daily   Continuous Infusions: . sodium chloride 50 mL/hr at 05/31/18 0600   PRN Meds:.acetaminophen, albuterol, heparin lock flush, heparin lock flush, prochlorperazine, sodium chloride flush, sodium chloride flush, sodium chloride flush  REVIEW OF SYSTEMS:    10 Point review of Systems was done is negative except as noted above.   LABORATORY DATA:  I have reviewed the data as listed  . CBC Latest Ref Rng & Units 06/01/2018 05/31/2018 05/30/2018  WBC 4.0 - 10.5 K/uL 0.7(LL) 0.7(LL) -  Hemoglobin 13.0 - 17.0 g/dL 8.7(L) 6.7(LL) -  Hematocrit 39.0 - 52.0 % 25.2(L) 19.4(L) -  Platelets 150 - 400 K/uL 53(L) 8(LL) 10(LL)    . CMP Latest Ref Rng & Units 06/01/2018 05/29/2018 05/25/2018  Glucose 70 - 99 mg/dL 152(H) - 111(H)  BUN 8 - 23 mg/dL 15 - 12  Creatinine 0.61 - 1.24 mg/dL 0.55(L) 0.72 0.69  Sodium 135 - 145 mmol/L 142 - 138  Potassium 3.5 - 5.1 mmol/L 3.9 - 3.3(L)  Chloride 98 - 111 mmol/L 106 - 100  CO2 22 - 32 mmol/L 28 - 26  Calcium 8.9 - 10.3 mg/dL 8.8(L) - 8.6(L)  Total Protein 6.5 - 8.1 g/dL 7.2 - 7.2  Total Bilirubin 0.3 - 1.2 mg/dL 1.0 - 0.6  Alkaline Phos 38 - 126 U/L 84 - 65  AST 15 - 41 U/L 14(L) - 15  ALT 0 - 44 U/L 15 - 12     RADIOGRAPHIC STUDIES: I have personally reviewed the radiological  images as listed and agreed with the findings in the report. Dg Chest 2 View  Result Date: 05/24/2018 CLINICAL DATA:  Neutropenic fever EXAM: CHEST - 2 VIEW COMPARISON:  Four days ago FINDINGS: Persistent generalized interstitial coarsening that appears bronchitic. No Kerley lines or effusion. No air bronchogram. Normal heart size and mediastinal contours. There is right upper extremity PICC with tip at the upper cavoatrial junction. IMPRESSION: Chronic bronchitic markings. No focal pneumonia or change from prior. Electronically Signed  By: Monte Fantasia M.D.   On: 05/24/2018 11:32   X-ray Chest Pa And Lateral  Result Date: 05/20/2018 CLINICAL DATA:  Fever this morning. EXAM: CHEST - 2 VIEW COMPARISON:  Chest CT April 02, 2018 and chest radiograph April 02, 2018. FINDINGS: Cardiac silhouette is upper limits of normal size. Calcified aortic arch. Mild bronchitic changes without pleural effusion or focal consolidation. Mild hyperinflation with flattened hemidiaphragms. No pneumothorax. Biapical pleural thickening. Soft tissue planes and included osseous structures are non suspicious. IMPRESSION: 1. Mild bronchitic changes and mild hyperinflation without focal consolidation. 2. Borderline cardiomegaly. 3.  Aortic Atherosclerosis (ICD10-I70.0). Electronically Signed   By: Elon Alas M.D.   On: 05/20/2018 14:29   Ir Picc Placement Right >5 Yrs Inc Img Guide  Result Date: 05/22/2018 INDICATION: Anemia. EXAM: RIGHT UPPER EXTREMITY PICC LINE PLACEMENT WITH ULTRASOUND AND FLUOROSCOPIC GUIDANCE MEDICATIONS: None ANESTHESIA/SEDATION: None FLUOROSCOPY TIME:  Fluoroscopy Time:  minutes 12 seconds (1 mGy). COMPLICATIONS: None immediate. PROCEDURE: The patient was advised of the possible risks and complications and agreed to undergo the procedure. The patient was then brought to the angiographic suite for the procedure. The right arm was prepped with chlorhexidine, draped in the usual sterile fashion using  maximum barrier technique (cap and mask, sterile gown, sterile gloves, large sterile sheet, hand hygiene and cutaneous antiseptic). Local anesthesia was attained by infiltration with 1% lidocaine. Ultrasound demonstrated patency of the basilic vein, and this was documented with an image. Under real-time ultrasound guidance, this vein was accessed with a 21 gauge micropuncture needle and image documentation was performed. The needle was exchanged over a guidewire for a peel-away sheath through which a 40 cm 5 [place] single lumen power injectable PICC was advanced, and positioned with its tip at the lower SVC/right atrial junction. Fluoroscopy during the procedure and fluoro spot radiograph confirms appropriate catheter position. The catheter was flushed, secured to the skin with Prolene sutures, and covered with a sterile dressing. IMPRESSION: Successful placement of a right arm PICC with sonographic and fluoroscopic guidance. The catheter is ready for use. Electronically Signed   By: Marybelle Killings M.D.   On: 05/22/2018 17:19   Ct Maxillofacial Wo Contrast  Result Date: 05/26/2018 CLINICAL DATA:  67 year old male with right upper jaw pain and persistent fever EXAM: CT MAXILLOFACIAL WITHOUT CONTRAST TECHNIQUE: Multidetector CT imaging of the maxillofacial structures was performed. Multiplanar CT image reconstructions were also generated. COMPARISON:  None. FINDINGS: Osseous: No fracture or mandibular dislocation. No destructive process. Periapical lucency present surrounding the root of the left second maxillary molar consistent with periodontal disease. Additional smaller periapical lucencies present at the right maxillary lateral incisor. Orbits: Negative. No traumatic or inflammatory finding. Sinuses: Clear. Soft tissues: Negative. Limited intracranial: No significant or unexpected finding. IMPRESSION: Positive for periodontal disease involving the root of the left second maxillary molar, and the right maxillary  lateral incisor. Sinuses are clear. Electronically Signed   By: Jacqulynn Cadet M.D.   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 S/p Cycle 1 Vidaza 05/06/2018  2.Renal insufficiency - creatinine - now normalized. -monitor  3.Diabetes - diet controled  4.Hemoccult positive stool  Upper endoscopy 04/03/2018-chronic gastritis, duodenitis  Colonoscopy 04/04/2018-diverticulosis in the sigmoid colon   5.Right lower  lobe 7 mm subpleural nodule on chest CT 04/02/2018  6.Febrile neutropenia 05/20/2018- placed on cefepime, antibiotics changed to vancomycin/meropenem on 05/21/2018, anidulafungin added 05/25/2018. Holding antimcrobials today since he has been fever free for atleast 48h  7.  Severe neutropenia- G-CSF started 05/22/2018, dose increased 05/26/2018 8.  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  Febrile reaction following a platelet transfusion 05/29/2018 PLAN -labs reviewed with patient today. hgb upto 8.7 and plt 53k  -tolerated transfusions well yesterday of 2 units of PRBC's and 2 units of PLTs with premedications. All products irradiated. ABO matched platelets due to refractoriness. -no indication for additional transfusions today. -continue G-CSF at this time. -no fevers for 48 hours .Temp (48hrs), Avg:98.3 F (36.8 C), Min:97.4 F (36.3 C), Max:99.9 F (37.7 C) -will discontinue antibiotics today and monitor for fevers. -rpt blood culture if fever>100.69F  Dr Learta Codding will f/u tomorrow.  DISPO - discharge home is no issues with fevers off  antibiotics and transfusion requirement manageable as outpatient.  I spent 20 minutes counseling the patient face to face. The total time spent in the appointment was 25 minutes and more than 50% was on counseling and direct patient cares.    Sullivan Lone MD [place] AAHIVMS [facility] Hematology/Oncology Physician [facility]  (Office):       [phone] (Work cell):  [phone] (Fax):           [phone]  06/01/2018 10:56 AM

## 2018-06-02 LAB — PREPARE PLATELET PHERESIS
UNIT DIVISION: 0
Unit division: 0

## 2018-06-02 LAB — BPAM PLATELET PHERESIS
BLOOD PRODUCT EXPIRATION DATE: 201909082359
Blood Product Expiration Date: 201909082359
ISSUE DATE / TIME: 201909080005
ISSUE DATE / TIME: 201909071518
UNIT TYPE AND RH: 9500
Unit Type and Rh: 5100

## 2018-06-02 LAB — CBC WITH DIFFERENTIAL/PLATELET
BASOS PCT: 5 %
Basophils Absolute: 0.1 10*3/uL (ref 0.0–0.1)
EOS PCT: 0 %
Eosinophils Absolute: 0 10*3/uL (ref 0.0–0.7)
HCT: 24.8 % — ABNORMAL LOW (ref 39.0–52.0)
HEMOGLOBIN: 8.6 g/dL — AB (ref 13.0–17.0)
LYMPHS PCT: 67 %
Lymphs Abs: 0.7 10*3/uL (ref 0.7–4.0)
MCH: 29.9 pg (ref 26.0–34.0)
MCHC: 34.7 g/dL (ref 30.0–36.0)
MCV: 86.1 fL (ref 78.0–100.0)
Monocytes Absolute: 0.2 10*3/uL (ref 0.1–1.0)
Monocytes Relative: 15 %
NEUTROS ABS: 0.1 10*3/uL — AB (ref 1.7–7.7)
Neutrophils Relative %: 13 %
Platelets: 30 10*3/uL — ABNORMAL LOW (ref 150–400)
RBC: 2.88 MIL/uL — ABNORMAL LOW (ref 4.22–5.81)
RDW: 14 % (ref 11.5–15.5)
WBC: 1.1 10*3/uL — CL (ref 4.0–10.5)

## 2018-06-02 NOTE — Care Management Note (Signed)
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 S/p Cycle 1 Vidaza 05/06/2018  2.Renal insufficiency - creatinine - now normalized. -monitor  3.Diabetes - diet controled  4.Hemoccult positive stool  Upper endoscopy 04/03/2018-chronic gastritis, duodenitis  Colonoscopy 04/04/2018-diverticulosis in the sigmoid colon   5.Right lower lobe 7 mm subpleural nodule on chest CT 04/02/2018  6.Febrile neutropenia 05/20/2018- placed on cefepime, antibiotics changed to vancomycin/meropenem on 05/21/2018, anidulafungin added 05/25/2018. Holding antimcrobials today since he has been fever free for atleast 48h  7. 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 Febrile reaction following a platelet transfusion 05/29/2018 PLAN -labs reviewed with patient today. hgb upto 8.7 and plt 53k  -tolerated transfusions well yesterday of 2 units of PRBC's and 2 units of PLTs with premedications. All products irradiated. ABO matched platelets due to refractoriness. -no indication for additional transfusions today. -continue G-CSF at this time. -no fevers for 48 hours .Temp (48hrs), Avg:98.3 F (36.8 C), Min:97.4 F (36.3 C), Max:99.9 F (37.7 C) -will discontinue antibiotics today and monitor for fevers. -rpt blood culture if fever>100.70F  Dr Learta Codding will f/u tomorrow.  DISPO -  discharge home is no issues with fevers off antibiotics and transfusion requirement manageable as outpatient.   Action/Plan: Will follow for progression of care and cm needs  Expected Discharge Date:  (unknown)               Expected Discharge Plan:  Home/Self Care  In-House Referral:     Discharge planning Services  CM Consult  Post Acute Care Choice:    Choice offered to:  Patient  DME Arranged:  IV pump/equipment DME Agency:  [place]:  RN, IV Antibiotics HH Agency:  [place]  Status of Service:  In process, will continue to follow  If discussed at Long Length of Stay Meetings, dates discussed:    Additional Comments:  Leeroy Cha, RN 06/02/2018, 2:50 PM

## 2018-06-02 NOTE — Progress Notes (Signed)
Patient ID: Andrew [name], male   DOB: 07-27-1951, 67 y.o.   MRN: [id number]          [facility] for Infectious Disease    Date of Admission:  05/20/2018     He has been afebrile for the past several days.  His empiric antibiotics were stopped 48 hours ago.  He remains neutropenic but has no signs of active infection.  I will sign off now but please call if I can be of further assistance while he is here.         Michel Bickers, MD [facility] for Infectious [facility] [phone] pager   [phone] cell 06/02/2018, 3:24 PM

## 2018-06-02 NOTE — Progress Notes (Signed)
IP PROGRESS NOTE  Subjective:   [name] has no complaint.  He wants to go home.  No bleeding.  No fever since stopping antibiotics 05/31/2018.  Objective: Vital signs in last 24 hours:Blood pressure (!) 149/58, pulse 86, temperature 98.2 F (36.8 C), temperature source Oral, resp. rate 17, height 5' 7.01" (1.702 m), weight 166 lb 4.8 oz (75.4 kg), SpO2 100 %.  Intake/Output from previous day: 09/08 0701 - 09/09 0700 In: 120 [P.O.:120] Out: 1600 [Urine:1600]  Physical Exam:  HEENT: No thrush or bleeding Lungs: Clear bilaterally, no respiratory distress Cardiac: Regular rate and rhythm  Abdomen: Nontender, no hepatosplenomegaly Extremities: No leg edema Skin: No petechiae, no rash  Right PICC without erythema  Lab Results: Recent Labs    06/01/18 0631 06/02/18 0503  WBC 0.7* 1.1*  HGB 8.7* 8.6*  HCT 25.2* 24.8*  PLT 53* 30*     BMET Recent Labs    06/01/18 0631  NA 142  K 3.9  CL 106  CO2 28  GLUCOSE 152*  BUN 15  CREATININE 0.55*  CALCIUM 8.8*     Studies/Results: No results found.  Medications: I have reviewed the patient's current medications.  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  Cycle 1 5-azacytidine 05/06/2018 2.Renal insufficiency 3.Diabetes 4.Hemoccult positive stool  Upper endoscopy 04/03/2018-chronic gastritis, duodenitis  Colonoscopy 04/04/2018-diverticulosis in the sigmoid colon 5.Right lower lobe 7 mm subpleural nodule on chest CT 04/02/2018 6.  Admission with febrile neutropenia 05/20/2018- placed on cefepime, antibiotics changed to vancomycin/meropenem on 05/21/2018, anidulafungin added 05/25/2018 antibiotics  stopped 05/31/2018 7.  Severe neutropenia- G-CSF started 05/22/2018, dose increased 05/26/2018 8.  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  Febrile reaction following a platelet transfusion 05/29/2018   [name] is now at day 28 following cycle 1 5 azacytidine.  He has persistent severe pancytopenia.  There is been no fever for the past 2 days while off of antibiotics. He has platelet alloimmunization.  He responded to platelet transfusions on 05/31/2018.  The platelet count is adequate today.  The plan is to discharge him to home on 06/03/2018 if he remains afebrile.  I plan to discuss the case with the leukemia service at [facility] with regard to prophylactic antibiotic recommendations.   Plan: 1.  No transfusions today 2.  Continue G-CSF 3.  Discharged home 06/03/2018 if he remains afebrile   LOS: 13 days   Betsy Coder, MD   06/02/2018, 7:21 AM

## 2018-06-02 NOTE — Care Management Important Message (Signed)
Important Message  Patient Details  Name: NIEL PERETTI MRN: [id number] Date of Birth: 12-18-1950   Medicare Important Message Given:  Yes    Kerin Salen 06/02/2018, 12:09 [place] Message  Patient Details  Name: JOSAFAT ENRICO MRN: [id number] Date of Birth: 1950-12-01   Medicare Important Message Given:  Yes    Kerin Salen 06/02/2018, 12:09 PM

## 2018-06-03 ENCOUNTER — Ambulatory Visit: Payer: Medicare Other | Admitting: Oncology

## 2018-06-03 ENCOUNTER — Telehealth: Payer: Self-pay | Admitting: *Deleted

## 2018-06-03 ENCOUNTER — Other Ambulatory Visit: Payer: Self-pay | Admitting: *Deleted

## 2018-06-03 DIAGNOSIS — Z79899 Other long term (current) drug therapy: Secondary | ICD-10-CM

## 2018-06-03 DIAGNOSIS — D61818 Other pancytopenia: Secondary | ICD-10-CM

## 2018-06-03 LAB — CBC WITH DIFFERENTIAL/PLATELET
Basophils Absolute: 0.1 10*3/uL (ref 0.0–0.1)
Basophils Relative: 6 %
EOS ABS: 0 10*3/uL (ref 0.0–0.7)
EOS PCT: 0 %
HCT: 24.7 % — ABNORMAL LOW (ref 39.0–52.0)
Hemoglobin: 8.5 g/dL — ABNORMAL LOW (ref 13.0–17.0)
Lymphocytes Relative: 63 %
Lymphs Abs: 0.7 10*3/uL (ref 0.7–4.0)
MCH: 29.7 pg (ref 26.0–34.0)
MCHC: 34.4 g/dL (ref 30.0–36.0)
MCV: 86.4 fL (ref 78.0–100.0)
MONO ABS: 0.2 10*3/uL (ref 0.1–1.0)
MONOS PCT: 21 %
Neutro Abs: 0.1 10*3/uL (ref 1.7–7.7)
Neutrophils Relative %: 10 %
PLATELETS: 13 10*3/uL — AB (ref 150–400)
RBC: 2.86 MIL/uL — ABNORMAL LOW (ref 4.22–5.81)
RDW: 13.6 % (ref 11.5–15.5)
WBC: 1.1 10*3/uL — AB (ref 4.0–10.5)

## 2018-06-03 MED ORDER — ACYCLOVIR 400 MG PO TABS
400.0000 mg | ORAL_TABLET | Freq: Two times a day (BID) | ORAL | Status: DC
  Administered 2018-06-03: 400 mg via ORAL
  Filled 2018-06-03: qty 1

## 2018-06-03 MED ORDER — SULFAMETHOXAZOLE-TRIMETHOPRIM 800-160 MG PO TABS: 1.0000 | ORAL_TABLET | ORAL | Status: DC

## 2018-06-03 MED ORDER — FLUCONAZOLE 50 MG PO TABS
50.0000 mg | ORAL_TABLET | Freq: Every day | ORAL | Status: DC
  Administered 2018-06-03: 50 mg via ORAL
  Filled 2018-06-03: qty 1

## 2018-06-03 MED ORDER — SULFAMETHOXAZOLE-TRIMETHOPRIM 800-160 MG PO TABS: 1.0000 | ORAL_TABLET | ORAL | 2 refills | Status: DC

## 2018-06-03 MED ORDER — ACYCLOVIR 400 MG PO TABS: 400.0000 mg | ORAL_TABLET | Freq: Two times a day (BID) | ORAL | 2 refills | Status: DC

## 2018-06-03 MED ORDER — HEPARIN SOD (PORK) LOCK FLUSH 100 UNIT/ML IV SOLN
250.0000 [IU] | INTRAVENOUS | Status: AC | PRN
  Administered 2018-06-03: 250 [IU]

## 2018-06-03 MED ORDER — FLUCONAZOLE 50 MG PO TABS: 50.0000 mg | ORAL_TABLET | Freq: Every day | ORAL | 1 refills | Status: DC

## 2018-06-03 MED ORDER — CHLORHEXIDINE GLUCONATE 0.12 % MT SOLN: 15.0000 mL | Freq: Two times a day (BID) | OROMUCOSAL | 2 refills | Status: DC

## 2018-06-03 NOTE — Progress Notes (Signed)
CRITICAL VALUE ALERT  Critical Value:  plt 13  Date & Time Notied:  9/10 0640  Provider Notified:yes  Orders Received/Actions taken: pending

## 2018-06-03 NOTE — Telephone Encounter (Signed)
For future blood transfusions it is not necessary for blood product to be CMV negative. Blood bank notified.

## 2018-06-03 NOTE — Discharge Summary (Signed)
Physician Discharge Summary  Patient ID: Andrew [name] @ATTENDINGNPI @ MRN: [id number] DOB/AGE: 02/18/1951 67 y.o.  Admit date: 05/20/2018 Discharge date: 06/03/2018  Discharge Diagnoses:  Principal Problem:   Neutropenic fever (Andrew [name]) Active Problems:   HTN (hypertension)   COPD (chronic obstructive pulmonary disease) (HCC)   Asthma   DM (diabetes mellitus) (HCC)   Tobacco use   Pancytopenia (HCC)   Myelodysplasia (myelodysplastic syndrome) (HCC)   Renal insufficiency   Atherosclerosis of aorta (HCC)   Poor dentition   Febrile neutropenia (HCC)   Discharged Condition: Improved Discharge Labs: 06/03/2018- hemo-and 8.5, platelets 13,000, white count 1.1, ANC 0.1  Significant Diagnostic Studies: CT maxillofacial  Consults: Dr. Carollee Herter disease  Procedures: Red cell transfusions, platelet transfusions  Disposition: Discharge disposition: 01-Home or Self Care        Allergies as of 06/03/2018      Reactions   Shellfish Allergy    " I BREAK OUT IN HIVES "      Medication List    STOP taking these medications   cholecalciferol 1000 units tablet Commonly known as:  VITAMIN D   gemfibrozil 600 MG tablet Commonly known as:  LOPID     TAKE these medications   acyclovir 400 MG tablet Commonly known as:  ZOVIRAX Take 1 tablet (400 mg total) by mouth 2 (two) times daily.   amLODipine 10 MG tablet Commonly known as:  NORVASC Take 5 mg by mouth daily.   benazepril 40 MG tablet Commonly known as:  LOTENSIN Take 40 mg by mouth daily.   chlorhexidine 0.12 % solution Commonly known as:  PERIDEX Use as directed 15 mLs in the mouth or throat 2 (two) times daily.   dorzolamide-timolol 22.3-6.8 MG/ML ophthalmic solution Commonly known as:  COSOPT Place 1 drop into both eyes 2 (two) times daily.   fluconazole 50 MG tablet Commonly known as:  DIFLUCAN Take 1 tablet (50 mg total) by mouth daily. Start taking on:  06/04/2018   metoprolol tartrate 25 MG  tablet Commonly known as:  LOPRESSOR Take 25 mg by mouth 2 (two) times daily.   montelukast 10 MG tablet Commonly known as:  SINGULAIR TK 1 T PO D   pantoprazole 40 MG tablet Commonly known as:  PROTONIX Take 1 tablet (40 mg total) by mouth 2 (two) times daily.   prochlorperazine 5 MG tablet Commonly known as:  COMPAZINE TAKE 1 TABLET(5 MG) BY MOUTH EVERY 6 HOURS AS NEEDED FOR NAUSEA OR VOMITING What changed:  See the new instructions.   sulfamethoxazole-trimethoprim 800-160 MG tablet Commonly known as:  BACTRIM DS,SEPTRA DS Take 1 tablet by mouth 3 (three) times a week. Start taking on:  06/04/2018   tetrahydrozoline-zinc 0.05-0.25 % ophthalmic solution Commonly known as:  VISINE-AC 2 drops as needed.   TRAVATAN Z 0.004 % Soln ophthalmic solution Generic drug:  Travoprost (BAK Free) Place 1 drop into both eyes every evening.   triamterene-hydrochlorothiazide 37.5-25 MG tablet Commonly known as:  MAXZIDE-25 Take 1 tablet by mouth daily.   VENTOLIN HFA 108 (90 Base) MCG/ACT inhaler Generic drug:  albuterol Inhale 1-2 puffs into the lungs 4 (four) times daily as needed.       Follow-up Information    Ladell Pier, MD Follow up.   Specialty:  Oncology Why:  return for lab 9/12 Contact information: [place] [address] [phone]           Hospital Course: [name] has been diagnosed with myelodysplasia with complex chromosome  abnormalities.  He was treated with cycle 1 5 azacytidine getting 05/06/2018. He was admitted with a fever and severe pancytopenia on 05/20/2018.  Cultures remain negative throughout this admission.  He was treated with broad-spectrum intravenous antibiotics.  No source for infection was identified, but a dental source was suspected.  He was evaluated by dental medicine.  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  He  was treated with broad-spectrum intravenous antibiotics.  Dr. Megan Salon was consulted.  A maxillofacial CT on 05/26/2018 revealed periodontal disease and clear sinuses.  [name] received multiple red cell and platelet transfusions during this admission.  He did not have significant bleeding.  He had a febrile reaction to a platelet transfusion on 05/29/2018.  He had a significant platelet increment after receiving crossmatch platelets on 05/31/2018.  The last fever spike was on 05/28/2018.  He was afebrile over the last several days of this hospital admission.  Antibiotics were discontinued on 05/31/2018 after he had been afebrile for 48 hours.  He remained afebrile until discharge on 06/03/2018.  He was treated with G-CSF beginning 05/22/2018.  He had persistent severe neutropenia despite the G-CSF.  He remained afebrile and appeared stable for discharge on 06/03/2018.  He will be placed on antibiotic prophylaxis at discharge.  He will have close outpatient follow-up at the Cancer center.     Discharge Instructions    Care order/instruction   Complete by:  As directed    Transfuse Parameters   Complete patient signature process for consent form   Complete by:  As directed    Practitioner attestation of consent   Complete by:  As directed    I, the ordering practitioner, attest that I have discussed with the patient the benefits, risks, side effects, alternatives, likelihood of achieving goals and potential problems during recovery for the procedure listed.   Procedure:  Blood Product(s)      Signed: Betsy Coder, MD 06/03/2018, 5:05 PM

## 2018-06-03 NOTE — Progress Notes (Signed)
Patient given discharge instructions, and verbalized an understanding of all discharge instructions.  Patient agrees with discharge plan, and is being discharged in stable medical condition.  Patient given transportation via wheelchair.  Patient to follow up with cancer center for labs/[facility] care on 06/05/18.

## 2018-06-03 NOTE — Discharge Instructions (Signed)
Call for fever, bleeding, or shortness of breath

## 2018-06-05 ENCOUNTER — Inpatient Hospital Stay: Payer: Medicare Other

## 2018-06-05 ENCOUNTER — Inpatient Hospital Stay (HOSPITAL_BASED_OUTPATIENT_CLINIC_OR_DEPARTMENT_OTHER): Payer: Medicare Other | Admitting: Oncology

## 2018-06-05 ENCOUNTER — Telehealth: Payer: Self-pay | Admitting: Oncology

## 2018-06-05 ENCOUNTER — Other Ambulatory Visit: Payer: Self-pay

## 2018-06-05 ENCOUNTER — Other Ambulatory Visit: Payer: Self-pay | Admitting: *Deleted

## 2018-06-05 ENCOUNTER — Inpatient Hospital Stay: Payer: Medicare Other | Attending: Oncology

## 2018-06-05 VITALS — BP 109/61 | HR 111 | Temp 99.5°F | Resp 18

## 2018-06-05 DIAGNOSIS — D61818 Other pancytopenia: Secondary | ICD-10-CM

## 2018-06-05 DIAGNOSIS — D469 Myelodysplastic syndrome, unspecified: Secondary | ICD-10-CM | POA: Diagnosis not present

## 2018-06-05 DIAGNOSIS — K056 Periodontal disease, unspecified: Secondary | ICD-10-CM | POA: Diagnosis not present

## 2018-06-05 DIAGNOSIS — K6289 Other specified diseases of anus and rectum: Secondary | ICD-10-CM | POA: Diagnosis not present

## 2018-06-05 DIAGNOSIS — Z79899 Other long term (current) drug therapy: Secondary | ICD-10-CM | POA: Diagnosis not present

## 2018-06-05 DIAGNOSIS — E119 Type 2 diabetes mellitus without complications: Secondary | ICD-10-CM | POA: Diagnosis not present

## 2018-06-05 DIAGNOSIS — D7589 Other specified diseases of blood and blood-forming organs: Secondary | ICD-10-CM | POA: Diagnosis not present

## 2018-06-05 DIAGNOSIS — Z5111 Encounter for antineoplastic chemotherapy: Secondary | ICD-10-CM | POA: Insufficient documentation

## 2018-06-05 DIAGNOSIS — K5730 Diverticulosis of large intestine without perforation or abscess without bleeding: Secondary | ICD-10-CM | POA: Diagnosis not present

## 2018-06-05 DIAGNOSIS — L98498 Non-pressure chronic ulcer of skin of other sites with other specified severity: Secondary | ICD-10-CM | POA: Diagnosis not present

## 2018-06-05 DIAGNOSIS — N289 Disorder of kidney and ureter, unspecified: Secondary | ICD-10-CM

## 2018-06-05 LAB — CBC WITH DIFFERENTIAL (CANCER CENTER ONLY)
BASOS ABS: 0 10*3/uL (ref 0.0–0.1)
Basophils Relative: 5 %
EOS PCT: 2 %
Eosinophils Absolute: 0 10*3/uL (ref 0.0–0.5)
HCT: 20.7 % — ABNORMAL LOW (ref 38.4–49.9)
HEMOGLOBIN: 7.1 g/dL — AB (ref 13.0–17.1)
LYMPHS ABS: 0.6 10*3/uL — AB (ref 0.9–3.3)
Lymphocytes Relative: 71 %
MCH: 29.1 pg (ref 27.2–33.4)
MCHC: 34.3 g/dL (ref 32.0–36.0)
MCV: 84.8 fL (ref 79.3–98.0)
Monocytes Absolute: 0.2 10*3/uL (ref 0.1–0.9)
Monocytes Relative: 18 %
NEUTROS ABS: 0 10*3/uL — AB (ref 1.5–6.5)
Neutrophils Relative %: 4 %
PLATELETS: 1 10*3/uL — AB (ref 140–400)
RBC: 2.44 MIL/uL — AB (ref 4.20–5.82)
RDW: 13.4 % (ref 11.0–14.6)
WBC: 0.8 10*3/uL — AB (ref 4.0–10.3)

## 2018-06-05 LAB — SAMPLE TO BLOOD BANK

## 2018-06-05 MED ORDER — ACETAMINOPHEN 325 MG PO TABS
ORAL_TABLET | ORAL | Status: AC
  Filled 2018-06-05: qty 2

## 2018-06-05 MED ORDER — MEPERIDINE HCL 25 MG/ML IJ SOLN
12.5000 mg | Freq: Once | INTRAMUSCULAR | Status: AC
  Administered 2018-06-05: 12.5 mg via INTRAVENOUS

## 2018-06-05 MED ORDER — SODIUM CHLORIDE 0.9% IV SOLUTION
250.0000 mL | Freq: Once | INTRAVENOUS | Status: AC
  Administered 2018-06-05: 250 mL via INTRAVENOUS
  Filled 2018-06-05: qty 250

## 2018-06-05 MED ORDER — MEPERIDINE HCL 25 MG/ML IJ SOLN
INTRAMUSCULAR | Status: AC
  Filled 2018-06-05: qty 1

## 2018-06-05 MED ORDER — IBUPROFEN 200 MG PO TABS
400.0000 mg | ORAL_TABLET | Freq: Once | ORAL | Status: AC
  Administered 2018-06-05: 400 mg via ORAL

## 2018-06-05 MED ORDER — SODIUM CHLORIDE 0.9% FLUSH
10.0000 mL | INTRAVENOUS | Status: DC | PRN
  Filled 2018-06-05: qty 10

## 2018-06-05 MED ORDER — IBUPROFEN 200 MG PO TABS
ORAL_TABLET | ORAL | Status: AC
  Filled 2018-06-05: qty 2

## 2018-06-05 MED ORDER — HEPARIN SOD (PORK) LOCK FLUSH 100 UNIT/ML IV SOLN
250.0000 [IU] | INTRAVENOUS | Status: DC | PRN
  Filled 2018-06-05: qty 5

## 2018-06-05 MED ORDER — ACETAMINOPHEN 325 MG PO TABS
650.0000 mg | ORAL_TABLET | Freq: Once | ORAL | Status: AC
  Administered 2018-06-05: 650 mg via ORAL

## 2018-06-05 MED ORDER — MEPERIDINE HCL 50 MG/ML IJ SOLN
INTRAMUSCULAR | Status: AC
  Filled 2018-06-05: qty 1

## 2018-06-05 MED ORDER — LEVOFLOXACIN 500 MG PO TABS: 500.0000 mg | ORAL_TABLET | Freq: Every day | ORAL | 1 refills | Status: DC

## 2018-06-05 NOTE — Progress Notes (Signed)
Pt escorted to his daughter's car in wheelchair by this RN. Pt's daughter states she is going to stay with him tonight and monitor for any fever, chills, shaking, etc.; and will bring him to the ED if necessary. Pt and daughter sent home with thermometer and education on use complete. Pt reminded to wear a mask and practice good hand hygiene, typical protocol for low WBC. Pt reminded his low platelets mean he is at high risk for bleeding and to be extremely cautious when walking as not to fall. Pt and daughter verbalized understanding.

## 2018-06-05 NOTE — Progress Notes (Signed)
Critical lab values from repeat CBC called to Dr [name].  Per Dr [name] pt OK for discharge home with instructions to go straight to the ED with any symptoms.   Pt and dtr verbalize understanding.

## 2018-06-05 NOTE — Progress Notes (Addendum)
[place] OFFICE PROGRESS NOTE   Diagnosis: Myelodysplasia, pancytopenia  INTERVAL HISTORY:   Andrew [name] was discharged from the hospital 06/03/2018.  No fever.  He feels well.  He reports some bleeding from the left nose.  No other bleeding.  Objective:  Vital signs in last 24 hours:  There were no vitals taken for this visit.    HEENT: No thrush or bleeding Resp: Lungs clear bilaterally Cardio: Regular rate and rhythm with premature beats Vascular: No leg edema    Portacath/PICC-without erythema  Lab Results:  Lab Results  Component Value Date   WBC 0.8 (LL) 06/05/2018   HGB 7.1 (L) 06/05/2018   HCT 20.7 (L) 06/05/2018   MCV 84.8 06/05/2018   PLT 1 (LL) 06/05/2018   NEUTROABS 0.0 (LL) 06/05/2018    CMP  Lab Results  Component Value Date   NA 142 06/01/2018   K 3.9 06/01/2018   CL 106 06/01/2018   CO2 28 06/01/2018   GLUCOSE 152 (H) 06/01/2018   BUN 15 06/01/2018   CREATININE 0.55 (L) 06/01/2018   CALCIUM 8.8 (L) 06/01/2018   PROT 7.2 06/01/2018   ALBUMIN 2.4 (L) 06/01/2018   AST 14 (L) 06/01/2018   ALT 15 06/01/2018   ALKPHOS 84 06/01/2018   BILITOT 1.0 06/01/2018   GFRNONAA >60 06/01/2018   GFRAA >60 06/01/2018     Medications: I have reviewed the patient's current medications.   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  Cycle 1 5-azacytidine 05/06/2018 2.Renal insufficiency 3.Diabetes 4.Hemoccult positive stool  Upper endoscopy 04/03/2018-chronic gastritis, duodenitis  Colonoscopy 04/04/2018-diverticulosis in the sigmoid colon 5.Right lower lobe 7 mm subpleural nodule on chest CT 04/02/2018 6.Admission  with febrile neutropenia 05/20/2018- placed on cefepime, antibiotics changed to vancomycin/meropenem on 05/21/2018, anidulafungin added 05/25/2018 antibiotics stopped 05/31/2018, discharge 06/03/2018 with prophylactic acyclovir, Bactrim, and Diflucan.  Bactrim discontinued and Levaquin started 06/05/2018 7.  Severe neutropenia- G-CSF started 05/22/2018, dose increased 05/26/2018 8.  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  Febrile reaction following a platelet transfusion 05/29/2018    Disposition: Andrew [name] is now at day 31 following cycle 1 5 azacytidine.  He has persistent severe pancytopenia.  He will be transfused with platelets today.  He will be scheduled for a red cell transfusion 06/07/2018.  I discussed the case with Dr. Florene Glen.  He recommends stopping Bactrim and adding Levaquin prophylaxis.  He recommends continuing red cell and platelet transfusion support as needed.  He recommends proceeding with a second cycle of 5 azacytidine prior to a restaging bone marrow.  We will delay cycle 2 until the week of 05/16/2018.  Andrew [name] will call for bleeding or fever.  He will be scheduled for an office visit 06/09/2018.  Betsy Coder, MD  06/05/2018  4:36 PM During a platelet transfusion today Andrew [name] developed a fever and Rigors.  The rigors regressed after he was given Tylenol.  He was treated with Demerol and ibuprofen.  The rigors gradually resolved and he became afebrile.  He otherwise appeared stable.  He had a similar febrile platelet transfusion reaction while hospitalized last week.  I have a low clinical suspicion for a systemic infection.  He will begin Levaquin prophylaxis. His daughter will contact us if he has  a recurrent fever or bleeding. Andrew [name] was noted to have an irregular heartbeat during and following the febrile reaction.  We will check an EKG prior to discharge from the Cancer center  tonight.  I noted his heart rate was irregular following the febrile platelet reaction last week.

## 2018-06-05 NOTE — Progress Notes (Signed)
Lab ordered and appt made for post platelet infusion.

## 2018-06-05 NOTE — Telephone Encounter (Signed)
Spoke to pt regarding upcoming appts per 9/12 sch message   °

## 2018-06-05 NOTE — Patient Instructions (Signed)

## 2018-06-06 ENCOUNTER — Other Ambulatory Visit: Payer: Self-pay | Admitting: *Deleted

## 2018-06-06 ENCOUNTER — Telehealth: Payer: Self-pay | Admitting: Oncology

## 2018-06-06 ENCOUNTER — Other Ambulatory Visit: Payer: Self-pay | Admitting: Oncology

## 2018-06-06 DIAGNOSIS — D61818 Other pancytopenia: Secondary | ICD-10-CM

## 2018-06-06 LAB — BPAM PLATELET PHERESIS
Blood Product Expiration Date: 201909122359
ISSUE DATE / TIME: 201909121523
Unit Type and Rh: 6200

## 2018-06-06 LAB — PREPARE PLATELET PHERESIS: UNIT DIVISION: 0

## 2018-06-06 LAB — CBC WITH DIFFERENTIAL (CANCER CENTER ONLY)
HEMATOCRIT: 19.3 % — AB (ref 39.0–52.0)
HEMOGLOBIN: 6.6 g/dL — AB (ref 13.0–17.1)
MCH: 28.8 pg (ref 26.0–34.0)
MCHC: 34.2 g/dL (ref 30.0–36.0)
MCV: 84.3 fL (ref 78.0–100.0)
Platelet Count: 1 10*3/uL — CL (ref 140–400)
RBC: 2.29 MIL/uL — ABNORMAL LOW (ref 4.22–5.81)
RDW: 13.4 % (ref 11.5–15.5)
WBC: 0.6 10*3/uL — AB (ref 4.0–10.3)

## 2018-06-06 LAB — PREPARE RBC (CROSSMATCH)

## 2018-06-06 NOTE — Telephone Encounter (Signed)
Scheduled appt per 9/12 los -pt to get an updated schedule next visit.   

## 2018-06-06 NOTE — Telephone Encounter (Signed)
appts canceled per 9/12 sch message. Spoke to pt regarding updates.

## 2018-06-07 ENCOUNTER — Inpatient Hospital Stay: Payer: Medicare Other

## 2018-06-07 VITALS — BP 124/70 | HR 77 | Temp 98.3°F | Resp 18

## 2018-06-07 DIAGNOSIS — D61818 Other pancytopenia: Secondary | ICD-10-CM

## 2018-06-07 DIAGNOSIS — C9000 Multiple myeloma not having achieved remission: Secondary | ICD-10-CM

## 2018-06-07 DIAGNOSIS — D469 Myelodysplastic syndrome, unspecified: Secondary | ICD-10-CM | POA: Diagnosis not present

## 2018-06-07 LAB — PREPARE RBC (CROSSMATCH)

## 2018-06-07 MED ORDER — ACETAMINOPHEN 325 MG PO TABS
ORAL_TABLET | ORAL | Status: AC
  Filled 2018-06-07: qty 2

## 2018-06-07 MED ORDER — HEPARIN SOD (PORK) LOCK FLUSH 100 UNIT/ML IV SOLN
500.0000 [IU] | Freq: Once | INTRAVENOUS | Status: AC
  Administered 2018-06-07: 250 [IU] via INTRAVENOUS
  Filled 2018-06-07: qty 5

## 2018-06-07 MED ORDER — SODIUM CHLORIDE 0.9% IV SOLUTION
250.0000 mL | Freq: Once | INTRAVENOUS | Status: AC
  Administered 2018-06-07: 250 mL via INTRAVENOUS
  Filled 2018-06-07: qty 250

## 2018-06-07 MED ORDER — SODIUM CHLORIDE 0.9% FLUSH
10.0000 mL | INTRAVENOUS | Status: DC | PRN
  Administered 2018-06-07: 10 mL via INTRAVENOUS
  Filled 2018-06-07: qty 10

## 2018-06-07 MED ORDER — ACETAMINOPHEN 325 MG PO TABS
650.0000 mg | ORAL_TABLET | Freq: Once | ORAL | Status: AC
  Administered 2018-06-07: 650 mg via ORAL

## 2018-06-07 NOTE — Patient Instructions (Signed)

## 2018-06-08 LAB — TYPE AND SCREEN
ABO/RH(D): O POS
ANTIBODY SCREEN: NEGATIVE
UNIT DIVISION: 0
Unit division: 0

## 2018-06-08 LAB — BPAM RBC
Blood Product Expiration Date: 201910112359
Blood Product Expiration Date: 201910112359
ISSUE DATE / TIME: 201909140830
ISSUE DATE / TIME: 201909140830
Unit Type and Rh: 5100
Unit Type and Rh: 5100

## 2018-06-08 LAB — PREPARE PLATELET PHERESIS: UNIT DIVISION: 0

## 2018-06-08 LAB — BPAM PLATELET PHERESIS
Blood Product Expiration Date: 201909152359
ISSUE DATE / TIME: 201909140832
UNIT TYPE AND RH: 5100

## 2018-06-09 ENCOUNTER — Inpatient Hospital Stay: Payer: Medicare Other

## 2018-06-11 ENCOUNTER — Inpatient Hospital Stay (HOSPITAL_BASED_OUTPATIENT_CLINIC_OR_DEPARTMENT_OTHER): Payer: Medicare Other | Admitting: Oncology

## 2018-06-11 ENCOUNTER — Inpatient Hospital Stay: Payer: Medicare Other

## 2018-06-11 ENCOUNTER — Other Ambulatory Visit: Payer: Self-pay | Admitting: Oncology

## 2018-06-11 ENCOUNTER — Telehealth: Payer: Self-pay

## 2018-06-11 VITALS — BP 128/60 | HR 80 | Temp 98.3°F | Resp 18 | Ht 67.01 in | Wt 154.9 lb

## 2018-06-11 DIAGNOSIS — D469 Myelodysplastic syndrome, unspecified: Secondary | ICD-10-CM

## 2018-06-11 DIAGNOSIS — D61818 Other pancytopenia: Secondary | ICD-10-CM

## 2018-06-11 DIAGNOSIS — E119 Type 2 diabetes mellitus without complications: Secondary | ICD-10-CM | POA: Diagnosis not present

## 2018-06-11 DIAGNOSIS — N289 Disorder of kidney and ureter, unspecified: Secondary | ICD-10-CM | POA: Diagnosis not present

## 2018-06-11 DIAGNOSIS — Z95828 Presence of other vascular implants and grafts: Secondary | ICD-10-CM

## 2018-06-11 DIAGNOSIS — Z79899 Other long term (current) drug therapy: Secondary | ICD-10-CM

## 2018-06-11 DIAGNOSIS — K056 Periodontal disease, unspecified: Secondary | ICD-10-CM

## 2018-06-11 LAB — CBC WITH DIFFERENTIAL (CANCER CENTER ONLY)
BASOS ABS: 0 10*3/uL (ref 0.0–0.1)
BASOS PCT: 2 %
EOS ABS: 0 10*3/uL (ref 0.0–0.5)
EOS PCT: 0 %
HCT: 24.2 % — ABNORMAL LOW (ref 38.4–49.9)
HEMOGLOBIN: 8.3 g/dL — AB (ref 13.0–17.1)
Lymphocytes Relative: 80 %
Lymphs Abs: 0.8 10*3/uL — ABNORMAL LOW (ref 0.9–3.3)
MCH: 29.3 pg (ref 27.2–33.4)
MCHC: 34.3 g/dL (ref 32.0–36.0)
MCV: 85.5 fL (ref 79.3–98.0)
Monocytes Absolute: 0.1 10*3/uL (ref 0.1–0.9)
Monocytes Relative: 13 %
NEUTROS PCT: 5 %
Neutro Abs: 0.1 10*3/uL — CL (ref 1.5–6.5)
PLATELETS: 6 10*3/uL — AB (ref 140–400)
RBC: 2.83 MIL/uL — AB (ref 4.20–5.82)
RDW: 13.5 % (ref 11.0–14.6)
WBC: 1 10*3/uL — AB (ref 4.0–10.3)

## 2018-06-11 MED ORDER — HEPARIN SOD (PORK) LOCK FLUSH 100 UNIT/ML IV SOLN
500.0000 [IU] | Freq: Once | INTRAVENOUS | Status: AC
  Administered 2018-06-11: 500 [IU] via INTRAVENOUS
  Filled 2018-06-11: qty 5

## 2018-06-11 MED ORDER — SODIUM CHLORIDE 0.9% FLUSH
10.0000 mL | INTRAVENOUS | Status: DC | PRN
  Administered 2018-06-11: 10 mL via INTRAVENOUS
  Filled 2018-06-11: qty 10

## 2018-06-11 NOTE — Telephone Encounter (Signed)
Printed avs and calender of upcoming appointment./ per 9/18 los 

## 2018-06-11 NOTE — Progress Notes (Signed)
  [place] OFFICE PROGRESS NOTE   Diagnosis: Myelodysplasia  INTERVAL HISTORY:   Andrew [name] returns for a scheduled visit.  He was transfused with packed red blood cells and platelets on 06/07/2018.  He denies fever during the transfusion.  He feels well.  No bleeding.  No complaint.  He reports that he was contacted by our office and told not to attend the appointment at [facility] 06/09/2018.  Objective:  Vital signs in last 24 hours:  Blood pressure 128/60, pulse 80, temperature 98.3 F (36.8 C), temperature source Oral, resp. rate 18, height 5' 7.01" (1.702 m), weight 154 lb 14.4 oz (70.3 kg), SpO2 100 %.    HEENT: Small ecchymosis at the left buccal mucosa, no active bleeding, no thrush Resp: Lungs clear bilaterally Cardio: Regular rate and rhythm GI: No hepatosplenomegaly, nontender Vascular: No leg edema  Skin: No petechiae or ecchymoses  Portacath/PICC-without erythema  Lab Results:  Lab Results  Component Value Date   WBC 1.0 (L) 06/11/2018   HGB 8.3 (L) 06/11/2018   HCT 24.2 (L) 06/11/2018   MCV 85.5 06/11/2018   PLT 6 (LL) 06/11/2018   NEUTROABS 0.1 (LL) 06/11/2018    CMP  Lab Results  Component Value Date   NA 142 06/01/2018   K 3.9 06/01/2018   CL 106 06/01/2018   CO2 28 06/01/2018   GLUCOSE 152 (H) 06/01/2018   BUN 15 06/01/2018   CREATININE 0.55 (L) 06/01/2018   CALCIUM 8.8 (L) 06/01/2018   PROT 7.2 06/01/2018   ALBUMIN 2.4 (L) 06/01/2018   AST 14 (L) 06/01/2018   ALT 15 06/01/2018   ALKPHOS 84 06/01/2018   BILITOT 1.0 06/01/2018   GFRNONAA >60 06/01/2018   GFRAA >60 06/01/2018     Medications: I have reviewed the patient's current medications.   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  Cycle 1 5-azacytidine 05/06/2018 2.Renal insufficiency 3.Diabetes 4.Hemoccult positive stool  Upper endoscopy 04/03/2018-chronic gastritis, duodenitis  Colonoscopy 04/04/2018-diverticulosis in the sigmoid colon 5.Right lower lobe 7 mm subpleural nodule on chest CT 04/02/2018 6.Admission with febrile neutropenia 05/20/2018- placed on cefepime, antibiotics changed to vancomycin/meropenem on 05/21/2018, anidulafungin added 9/1/2019antibiotics stopped 05/31/2018, discharge 06/03/2018 with prophylactic acyclovir, Bactrim, and Diflucan.  Bactrim discontinued and Levaquin started 06/05/2018 7. 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 Febrile reaction following a platelet transfusion 05/29/2018 and 06/05/2018     Disposition: Andrew [name] appears stable.  He has persistent severe pancytopenia.  He has platelet alloimmunization, but has responded to crossmatched platelets.  We will request crossmatched platelets be available for transfusion on 06/13/2018 and 06/16/2018.  He will return for an office visit on 06/16/2018.  I discussed the case with the leukemia service at [facility].  They recommend proceeding with cycle 2 5 azacytidine despite the persistent severe pancytopenia.  We will reschedule the bone marrow transplant appointment at [facility].  25 minutes were spent with the patient today.  The majority of the time was used for counseling and coordination of care.  Betsy Coder, MD  06/11/2018  9:47 AM

## 2018-06-12 ENCOUNTER — Telehealth: Payer: Self-pay | Admitting: Emergency Medicine

## 2018-06-12 ENCOUNTER — Telehealth: Payer: Self-pay

## 2018-06-12 DIAGNOSIS — D61818 Other pancytopenia: Secondary | ICD-10-CM

## 2018-06-12 NOTE — Telephone Encounter (Signed)
Discussed in detail w/ the patient his next appointments for labs on 9/20 and platelets on 9/21 & 9/23. He voiced understanding of this.

## 2018-06-13 ENCOUNTER — Inpatient Hospital Stay: Payer: Medicare Other

## 2018-06-13 ENCOUNTER — Other Ambulatory Visit: Payer: Self-pay | Admitting: *Deleted

## 2018-06-13 ENCOUNTER — Other Ambulatory Visit: Payer: Self-pay | Admitting: Nurse Practitioner

## 2018-06-13 DIAGNOSIS — D469 Myelodysplastic syndrome, unspecified: Secondary | ICD-10-CM | POA: Diagnosis not present

## 2018-06-13 DIAGNOSIS — Z452 Encounter for adjustment and management of vascular access device: Secondary | ICD-10-CM | POA: Insufficient documentation

## 2018-06-13 DIAGNOSIS — D61818 Other pancytopenia: Secondary | ICD-10-CM

## 2018-06-13 LAB — CBC WITH DIFFERENTIAL (CANCER CENTER ONLY)
Basophils Absolute: 0 10*3/uL (ref 0.0–0.1)
Basophils Relative: 0 %
EOS PCT: 2 %
Eosinophils Absolute: 0 10*3/uL (ref 0.0–0.5)
HEMATOCRIT: 22.7 % — AB (ref 38.4–49.9)
Hemoglobin: 7.8 g/dL — ABNORMAL LOW (ref 13.0–17.1)
LYMPHS PCT: 80 %
Lymphs Abs: 0.7 10*3/uL — ABNORMAL LOW (ref 0.9–3.3)
MCH: 29 pg (ref 27.2–33.4)
MCHC: 34.3 g/dL (ref 32.0–36.0)
MCV: 84.5 fL (ref 79.3–98.0)
Monocytes Absolute: 0.1 10*3/uL (ref 0.1–0.9)
Monocytes Relative: 15 %
NEUTROS ABS: 0 10*3/uL — AB (ref 1.5–6.5)
Neutrophils Relative %: 3 %
PLATELETS: 5 10*3/uL — AB (ref 140–400)
RBC: 2.69 MIL/uL — AB (ref 4.20–5.82)
RDW: 13.4 % (ref 11.0–14.6)
WBC: 0.8 10*3/uL — AB (ref 4.0–10.3)

## 2018-06-13 LAB — SAMPLE TO BLOOD BANK

## 2018-06-13 MED ORDER — SODIUM CHLORIDE 0.9% FLUSH
10.0000 mL | INTRAVENOUS | Status: DC | PRN
  Administered 2018-06-13: 10 mL
  Filled 2018-06-13: qty 10

## 2018-06-13 MED ORDER — HEPARIN SOD (PORK) LOCK FLUSH 100 UNIT/ML IV SOLN
500.0000 [IU] | Freq: Once | INTRAVENOUS | Status: AC | PRN
  Administered 2018-06-13: 500 [IU]
  Filled 2018-06-13: qty 5

## 2018-06-13 NOTE — Telephone Encounter (Signed)
Duplicate

## 2018-06-14 ENCOUNTER — Inpatient Hospital Stay: Payer: Medicare Other

## 2018-06-14 DIAGNOSIS — D469 Myelodysplastic syndrome, unspecified: Secondary | ICD-10-CM | POA: Diagnosis not present

## 2018-06-14 DIAGNOSIS — D61818 Other pancytopenia: Secondary | ICD-10-CM

## 2018-06-14 MED ORDER — ACETAMINOPHEN 325 MG PO TABS
650.0000 mg | ORAL_TABLET | Freq: Once | ORAL | Status: AC
  Administered 2018-06-14: 650 mg via ORAL

## 2018-06-14 MED ORDER — SODIUM CHLORIDE 0.9% FLUSH
10.0000 mL | INTRAVENOUS | Status: AC | PRN
  Administered 2018-06-14: 10 mL
  Filled 2018-06-14: qty 10

## 2018-06-14 MED ORDER — ACETAMINOPHEN 325 MG PO TABS
ORAL_TABLET | ORAL | Status: AC
  Filled 2018-06-14: qty 2

## 2018-06-14 MED ORDER — HEPARIN SOD (PORK) LOCK FLUSH 100 UNIT/ML IV SOLN
250.0000 [IU] | INTRAVENOUS | Status: AC | PRN
  Administered 2018-06-14: 250 [IU]
  Filled 2018-06-14: qty 5

## 2018-06-14 MED ORDER — SODIUM CHLORIDE 0.9% IV SOLUTION
250.0000 mL | Freq: Once | INTRAVENOUS | Status: AC
  Administered 2018-06-14: 250 mL via INTRAVENOUS
  Filled 2018-06-14: qty 250

## 2018-06-14 NOTE — Patient Instructions (Signed)

## 2018-06-15 LAB — BPAM PLATELET PHERESIS
Blood Product Expiration Date: 201909222359
ISSUE DATE / TIME: 201909210840
Unit Type and Rh: 5100

## 2018-06-15 LAB — PREPARE PLATELET PHERESIS: UNIT DIVISION: 0

## 2018-06-16 ENCOUNTER — Inpatient Hospital Stay: Payer: Medicare Other

## 2018-06-16 ENCOUNTER — Telehealth: Payer: Self-pay

## 2018-06-16 ENCOUNTER — Other Ambulatory Visit: Payer: Self-pay

## 2018-06-16 ENCOUNTER — Telehealth: Payer: Self-pay | Admitting: Nurse Practitioner

## 2018-06-16 ENCOUNTER — Other Ambulatory Visit: Payer: Self-pay | Admitting: Nurse Practitioner

## 2018-06-16 ENCOUNTER — Inpatient Hospital Stay (HOSPITAL_BASED_OUTPATIENT_CLINIC_OR_DEPARTMENT_OTHER): Payer: Medicare Other | Admitting: Nurse Practitioner

## 2018-06-16 ENCOUNTER — Encounter: Payer: Self-pay | Admitting: Nurse Practitioner

## 2018-06-16 VITALS — BP 103/61 | HR 78 | Temp 98.0°F | Resp 18 | Ht 67.01 in | Wt 155.5 lb

## 2018-06-16 VITALS — BP 106/52 | HR 75 | Temp 98.0°F | Resp 17

## 2018-06-16 DIAGNOSIS — K056 Periodontal disease, unspecified: Secondary | ICD-10-CM

## 2018-06-16 DIAGNOSIS — D61818 Other pancytopenia: Secondary | ICD-10-CM

## 2018-06-16 DIAGNOSIS — D469 Myelodysplastic syndrome, unspecified: Secondary | ICD-10-CM | POA: Diagnosis not present

## 2018-06-16 DIAGNOSIS — D7589 Other specified diseases of blood and blood-forming organs: Secondary | ICD-10-CM | POA: Diagnosis not present

## 2018-06-16 DIAGNOSIS — N289 Disorder of kidney and ureter, unspecified: Secondary | ICD-10-CM

## 2018-06-16 DIAGNOSIS — E119 Type 2 diabetes mellitus without complications: Secondary | ICD-10-CM | POA: Diagnosis not present

## 2018-06-16 DIAGNOSIS — K5730 Diverticulosis of large intestine without perforation or abscess without bleeding: Secondary | ICD-10-CM

## 2018-06-16 DIAGNOSIS — Z79899 Other long term (current) drug therapy: Secondary | ICD-10-CM

## 2018-06-16 LAB — CMP (CANCER CENTER ONLY)
ALBUMIN: 2.2 g/dL — AB (ref 3.5–5.0)
ALT: 22 U/L (ref 0–44)
AST: 16 U/L (ref 15–41)
Alkaline Phosphatase: 142 U/L — ABNORMAL HIGH (ref 38–126)
Anion gap: 10 (ref 5–15)
BUN: 40 mg/dL — AB (ref 8–23)
CHLORIDE: 98 mmol/L (ref 98–111)
CO2: 24 mmol/L (ref 22–32)
Calcium: 9 mg/dL (ref 8.9–10.3)
Creatinine: 1.42 mg/dL — ABNORMAL HIGH (ref 0.61–1.24)
GFR, Est AFR Am: 58 mL/min — ABNORMAL LOW (ref >60)
GFR, Estimated: 50 mL/min — ABNORMAL LOW (ref >60)
GLUCOSE: 104 mg/dL — AB (ref 70–99)
POTASSIUM: 4.4 mmol/L (ref 3.5–5.1)
Sodium: 132 mmol/L — ABNORMAL LOW (ref 135–145)
Total Bilirubin: 0.5 mg/dL (ref 0.3–1.2)
Total Protein: 8.1 g/dL (ref 6.5–8.1)

## 2018-06-16 LAB — SAMPLE TO BLOOD BANK

## 2018-06-16 LAB — PLATELET COUNT (CANCER CENTER ONLY): Platelet Count: 42 K/uL — ABNORMAL LOW (ref 140–400)

## 2018-06-16 MED ORDER — ACETAMINOPHEN 325 MG PO TABS
ORAL_TABLET | ORAL | Status: AC
  Filled 2018-06-16: qty 2

## 2018-06-16 MED ORDER — ONDANSETRON HCL 8 MG PO TABS
ORAL_TABLET | ORAL | Status: AC
  Filled 2018-06-16: qty 1

## 2018-06-16 MED ORDER — AZACITIDINE CHEMO SQ INJECTION
150.0000 mg | Freq: Once | INTRAMUSCULAR | Status: AC
  Administered 2018-06-16: 150 mg via SUBCUTANEOUS
  Filled 2018-06-16: qty 6

## 2018-06-16 MED ORDER — ONDANSETRON HCL 8 MG PO TABS
8.0000 mg | ORAL_TABLET | Freq: Once | ORAL | Status: AC
  Administered 2018-06-16: 8 mg via ORAL

## 2018-06-16 MED ORDER — ACETAMINOPHEN 325 MG PO TABS
650.0000 mg | ORAL_TABLET | Freq: Once | ORAL | Status: AC
  Administered 2018-06-16: 650 mg via ORAL

## 2018-06-16 NOTE — Telephone Encounter (Signed)
Spoke with patient concerning upcoming appointment. Per 9/18 los

## 2018-06-16 NOTE — Patient Instructions (Signed)

## 2018-06-16 NOTE — Patient Instructions (Signed)
Blood Transfusion, Care After This sheet gives you information about how to care for yourself after your procedure. Your doctor may also give you more specific instructions. If you have problems or questions, contact your doctor. Follow these instructions at home:  Take over-the-counter and prescription medicines only as told by your doctor.  Go back to your normal activities as told by your doctor.  Follow instructions from your doctor about how to take care of the area where an IV tube was put into your vein (insertion site). Make sure you: ? Wash your hands with soap and water before you change your bandage (dressing). If there is no soap and water, use hand sanitizer. ? Change your bandage as told by your doctor.  Check your IV insertion site every day for signs of infection. Check for: ? More redness, swelling, or pain. ? More fluid or blood. ? Warmth. ? Pus or a bad smell. Contact a doctor if:  You have more redness, swelling, or pain around the IV insertion site..  You have more fluid or blood coming from the IV insertion site.  Your IV insertion site feels warm to the touch.  You have pus or a bad smell coming from the IV insertion site.  Your pee (urine) turns pink, red, or brown.  You feel weak after doing your normal activities. Get help right away if:  You have signs of a serious allergic or body defense (immune) system reaction, including: ? Itchiness. ? Hives. ? Trouble breathing. ? Anxiety. ? Pain in your chest or lower back. ? Fever, flushing, and chills. ? Fast pulse. ? Rash. ? Watery poop (diarrhea). ? Throwing up (vomiting). ? Dark pee. ? Serious headache. ? Dizziness. ? Stiff neck. ? Yellow color in your face or the white parts of your eyes (jaundice). Summary  After a blood transfusion, return to your normal activities as told by your doctor.  Every day, check for signs of infection where the IV tube was put into your vein.  Some signs of  infection are warm skin, more redness and pain, more fluid or blood, and pus or a bad smell where the needle went in.  Contact your doctor if you feel weak or have any unusual symptoms. This information is not intended to replace advice given to you by your health care provider. Make sure you discuss any questions you have with your health care provider. Document Released: 10/01/2014 Document Revised: 05/04/2016 Document Reviewed: 05/04/2016 Elsevier Interactive Patient Education  2017 [employer].  Azacitidine suspension for injection (subcutaneous use) What is this medicine? AZACITIDINE (ay [place]) is a chemotherapy drug. This medicine reduces the growth of cancer cells and can suppress the immune system. It is used for treating myelodysplastic syndrome or some types of leukemia. This medicine may be used for other purposes; ask your health care provider or pharmacist if you have questions. COMMON BRAND NAME(S): Vidaza What should I tell my health care provider before I take this medicine? They need to know if you have any of these conditions: -kidney disease -liver disease -liver tumors -an unusual or allergic reaction to azacitidine, mannitol, other medicines, foods, dyes, or preservatives -pregnant or trying to get pregnant -breast-feeding How should I use this medicine? This medicine is for injection under the skin. It is administered in a hospital or clinic by a specially trained health care professional. Talk to your pediatrician regarding the use of this medicine in children. While this drug may be prescribed for  selected conditions, precautions do apply. Overdosage: If you think you have taken too much of this medicine contact a poison control center or emergency room at once. NOTE: This medicine is only for you. Do not share this medicine with others. What if I miss a dose? It is important not to miss your dose. Call your doctor or health care professional if you are  unable to keep an appointment. What may interact with this medicine? Interactions have not been studied. Give your health care provider a list of all the medicines, herbs, non-prescription drugs, or dietary supplements you use. Also tell them if you smoke, drink alcohol, or use illegal drugs. Some items may interact with your medicine. This list may not describe all possible interactions. Give your health care provider a list of all the medicines, herbs, non-prescription drugs, or dietary supplements you use. Also tell them if you smoke, drink alcohol, or use illegal drugs. Some items may interact with your medicine. What should I watch for while using this medicine? Visit your doctor for checks on your progress. This drug may make you feel generally unwell. This is not uncommon, as chemotherapy can affect healthy cells as well as cancer cells. Report any side effects. Continue your course of treatment even though you feel ill unless your doctor tells you to stop. In some cases, you may be given additional medicines to help with side effects. Follow all directions for their use. Call your doctor or health care professional for advice if you get a fever, chills or sore throat, or other symptoms of a cold or flu. Do not treat yourself. This drug decreases your body's ability to fight infections. Try to avoid being around people who are sick. This medicine may increase your risk to bruise or bleed. Call your doctor or health care professional if you notice any unusual bleeding. You may need blood work done while you are taking this medicine. Do not become pregnant while taking this medicine and for 6 months after the last dose. Women should inform their doctor if they wish to become pregnant or think they might be pregnant. Men should not father a child while taking this medicine and for 3 months after the last dose. There is a potential for serious side effects to an unborn child. Talk to your health care  professional or pharmacist for more information. Do not breast-feed an infant while taking this medicine and for 1 week after the last dose. This medicine may interfere with the ability to have a child. Talk with your doctor or health care professional if you are concerned about your fertility. What side effects may I notice from receiving this medicine? Side effects that you should report to your doctor or health care professional as soon as possible: -allergic reactions like skin rash, itching or hives, swelling of the face, lips, or tongue -low blood counts - this medicine may decrease the number of white blood cells, red blood cells and platelets. You may be at increased risk for infections and bleeding. -signs of infection - fever or chills, cough, sore throat, pain passing urine -signs of decreased platelets or bleeding - bruising, pinpoint red spots on the skin, black, tarry stools, blood in the urine -signs of decreased red blood cells - unusually weak or tired, fainting spells, lightheadedness -signs and symptoms of kidney injury like trouble passing urine or change in the amount of urine -signs and symptoms of liver injury like dark yellow or brown urine; general ill feeling or  flu-like symptoms; light-colored stools; loss of appetite; nausea; right upper belly pain; unusually weak or tired; yellowing of the eyes or skin Side effects that usually do not require medical attention (report to your doctor or health care professional if they continue or are bothersome): -constipation -diarrhea -nausea, vomiting -pain or redness at the injection site -unusually weak or tired This list may not describe all possible side effects. Call your doctor for medical advice about side effects. You may report side effects to FDA at 1-800-FDA-1088. Where should I keep my medicine? This drug is given in a hospital or clinic and will not be stored at home. NOTE: This sheet is a summary. It may not cover all  possible information. If you have questions about this medicine, talk to your doctor, pharmacist, or health care provider.  2018 Elsevier/Gold Standard (2016-10-09 14:37:51)

## 2018-06-16 NOTE — Progress Notes (Signed)
  [facility] OFFICE PROGRESS NOTE   Diagnosis: Myelodysplasia  INTERVAL HISTORY:   Andrew [name] returns as scheduled.  He was transfused platelets 06/14/2018.  He denies signs of a reaction.  He overall feels well.  He denies bleeding.  No fever or chills.  No shortness of breath.  No nausea or vomiting.  No diarrhea.  He denies pain.  He has a good appetite.  Objective:  Vital signs in last 24 hours:  Blood pressure 103/61, pulse 78, temperature 98 F (36.7 C), temperature source Oral, resp. rate 18, height 5' 7.01" (1.702 m), weight 155 lb 8 oz (70.5 kg), SpO2 100 %.    HEENT: No thrush or ulcers. Resp: Lungs clear bilaterally. Cardio: Regular rate and rhythm. GI: Abdomen soft and nontender.  No hepatosplenomegaly. Vascular: No leg edema. Neuro: Alert and oriented. Right upper extremity PICC without erythema.  Lab Results:  Lab Results  Component Value Date   WBC 0.9 (LL) 06/16/2018   HGB 6.8 (LL) 06/16/2018   HCT 19.7 (L) 06/16/2018   MCV 84.9 06/16/2018   PLT 5 (LL) 06/16/2018   NEUTROABS 0.0 (LL) 06/16/2018    Imaging:  No results found.  Medications: I have reviewed the patient's current medications.  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  Cycle 1 5-azacytidine 05/06/2018  Cycle 2 5-azacytidine 06/16/2018 2.Renal insufficiency 3.Diabetes 4.Hemoccult positive stool  Upper endoscopy 04/03/2018-chronic gastritis, duodenitis  Colonoscopy 04/04/2018-diverticulosis in the sigmoid colon 5.Right lower lobe 7 mm subpleural nodule on chest CT 04/02/2018 6.Admission with febrile neutropenia 05/20/2018- placed on cefepime,  antibiotics changed to vancomycin/meropenem on 05/21/2018, anidulafungin added 9/1/2019antibiotics stopped 05/31/2018, discharge 06/03/2018 with prophylactic acyclovir, Bactrim, and Diflucan. Bactrim discontinued and Levaquin started 06/05/2018 7. 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 Febrile reaction following a platelet transfusion 05/29/2018 and 06/05/2018  Disposition: Andrew [name] appears unchanged.  He has completed 1 cycle of 5-azacytidine.  He continues to have severe pancytopenia.  The plan is to proceed with cycle 2 today as scheduled.  He will also receive a platelet transfusion today and 2 units of blood tomorrow.  Plan for repeat CBC 06/18/2018.  We will see him in follow-up in 1 week.  He will contact the office in the interim with any problems.  We specifically discussed fever, chills, other signs of infection, bleeding.  Plan discussed/reviewed with Dr. Irene Limbo in Dr. Gearldine Shown absence.    [name] ANP/GNP-BC   06/16/2018  1:37 PM

## 2018-06-16 NOTE — Telephone Encounter (Signed)
Scheduled appt per 9/23 los - pt to get an updated schedule next visit.   

## 2018-06-16 NOTE — Addendum Note (Signed)
Addended by: Hughie Closs on: 06/16/2018 04:28 PM   Modules accepted: Orders

## 2018-06-17 ENCOUNTER — Inpatient Hospital Stay: Payer: Medicare Other

## 2018-06-17 ENCOUNTER — Other Ambulatory Visit: Payer: Self-pay | Admitting: Emergency Medicine

## 2018-06-17 VITALS — BP 94/55 | HR 83 | Temp 98.4°F | Resp 16

## 2018-06-17 DIAGNOSIS — D469 Myelodysplastic syndrome, unspecified: Secondary | ICD-10-CM | POA: Diagnosis not present

## 2018-06-17 DIAGNOSIS — D61818 Other pancytopenia: Secondary | ICD-10-CM

## 2018-06-17 LAB — PREPARE PLATELET PHERESIS: UNIT DIVISION: 0

## 2018-06-17 LAB — BPAM PLATELET PHERESIS
Blood Product Expiration Date: 201909232359
ISSUE DATE / TIME: 201909231441
UNIT TYPE AND RH: 5100

## 2018-06-17 LAB — PREPARE RBC (CROSSMATCH)

## 2018-06-17 MED ORDER — HEPARIN SOD (PORK) LOCK FLUSH 100 UNIT/ML IV SOLN
500.0000 [IU] | Freq: Every day | INTRAVENOUS | Status: AC | PRN
  Filled 2018-06-17: qty 5

## 2018-06-17 MED ORDER — HEPARIN SOD (PORK) LOCK FLUSH 100 UNIT/ML IV SOLN
250.0000 [IU] | INTRAVENOUS | Status: AC | PRN
  Administered 2018-06-17: 250 [IU]
  Filled 2018-06-17: qty 5

## 2018-06-17 MED ORDER — ONDANSETRON HCL 8 MG PO TABS
ORAL_TABLET | ORAL | Status: AC
  Filled 2018-06-17: qty 1

## 2018-06-17 MED ORDER — SODIUM CHLORIDE 0.9 % IV SOLN
INTRAVENOUS | Status: AC
  Administered 2018-06-17: 09:00:00 via INTRAVENOUS
  Filled 2018-06-17: qty 250

## 2018-06-17 MED ORDER — ONDANSETRON HCL 8 MG PO TABS
8.0000 mg | ORAL_TABLET | Freq: Once | ORAL | Status: AC
  Administered 2018-06-17: 8 mg via ORAL

## 2018-06-17 MED ORDER — SORBITOL 70 % PO SOLN: 15.0000 mL | Freq: Every day | ORAL | 0 refills | Status: DC | PRN

## 2018-06-17 MED ORDER — AZACITIDINE CHEMO SQ INJECTION
150.0000 mg | Freq: Once | INTRAMUSCULAR | Status: AC
  Administered 2018-06-17: 150 mg via SUBCUTANEOUS
  Filled 2018-06-17: qty 6

## 2018-06-17 MED ORDER — SODIUM CHLORIDE 0.9% FLUSH
3.0000 mL | INTRAVENOUS | Status: AC | PRN
  Administered 2018-06-17: 10 mL
  Filled 2018-06-17: qty 10

## 2018-06-17 NOTE — Patient Instructions (Signed)
[facility] Discharge Instructions for Patients Receiving Chemotherapy  Today you received the following chemotherapy agents Vidaza.  To help prevent nausea and vomiting after your treatment, we encourage you to take your nausea medication as directed.   If you develop nausea and vomiting that is not controlled by your nausea medication, call the clinic.   BELOW ARE SYMPTOMS THAT SHOULD BE REPORTED IMMEDIATELY:  *FEVER GREATER THAN 100.5 F  *CHILLS WITH OR WITHOUT FEVER  NAUSEA AND VOMITING THAT IS NOT CONTROLLED WITH YOUR NAUSEA MEDICATION  *UNUSUAL SHORTNESS OF BREATH  *UNUSUAL BRUISING OR BLEEDING  TENDERNESS IN MOUTH AND THROAT WITH OR WITHOUT PRESENCE OF ULCERS  *URINARY PROBLEMS  *BOWEL PROBLEMS  UNUSUAL RASH Items with * indicate a potential emergency and should be followed up as soon as possible.  Feel free to call the clinic should you have any questions or concerns. The clinic phone number is [phone].  Please show the CHEMO ALERT CARD at check-in to the Emergency Department and triage nurse.    Blood Transfusion, Care After This sheet gives you information about how to care for yourself after your procedure. Your doctor may also give you more specific instructions. If you have problems or questions, contact your doctor. Follow these instructions at home:  Take over-the-counter and prescription medicines only as told by your doctor.  Go back to your normal activities as told by your doctor.  Follow instructions from your doctor about how to take care of the area where an IV tube was put into your vein (insertion site). Make sure you: ? Wash your hands with soap and water before you change your bandage (dressing). If there is no soap and water, use hand sanitizer. ? Change your bandage as told by your doctor.  Check your IV insertion site every day for signs of infection. Check for: ? More redness, swelling, or pain. ? More fluid or  blood. ? Warmth. ? Pus or a bad smell. Contact a doctor if:  You have more redness, swelling, or pain around the IV insertion site..  You have more fluid or blood coming from the IV insertion site.  Your IV insertion site feels warm to the touch.  You have pus or a bad smell coming from the IV insertion site.  Your pee (urine) turns pink, red, or brown.  You feel weak after doing your normal activities. Get help right away if:  You have signs of a serious allergic or body defense (immune) system reaction, including: ? Itchiness. ? Hives. ? Trouble breathing. ? Anxiety. ? Pain in your chest or lower back. ? Fever, flushing, and chills. ? Fast pulse. ? Rash. ? Watery poop (diarrhea). ? Throwing up (vomiting). ? Dark pee. ? Serious headache. ? Dizziness. ? Stiff neck. ? Yellow color in your face or the white parts of your eyes (jaundice). Summary  After a blood transfusion, return to your normal activities as told by your doctor.  Every day, check for signs of infection where the IV tube was put into your vein.  Some signs of infection are warm skin, more redness and pain, more fluid or blood, and pus or a bad smell where the needle went in.  Contact your doctor if you feel weak or have any unusual symptoms. This information is not intended to replace advice given to you by your health care provider. Make sure you discuss any questions you have with your health care provider. Document Released: 10/01/2014 Document Revised: 05/04/2016 Document   Reviewed: 05/04/2016 Elsevier Interactive Patient Education  2017 Elsevier Inc.  

## 2018-06-17 NOTE — Progress Notes (Unsigned)
Per [name], NP okay to continue with Viadaza with ANC count d/t history of MDS.  Patient c/o hard stools.  Last BM 06/16/2018.  Patient has tried OTC stool softener and Miralax.  Collaborative nurse aware and will contact patient with physician recommendations.

## 2018-06-18 ENCOUNTER — Inpatient Hospital Stay: Payer: Medicare Other

## 2018-06-18 ENCOUNTER — Inpatient Hospital Stay: Payer: Medicare Other | Admitting: Nurse Practitioner

## 2018-06-18 ENCOUNTER — Telehealth: Payer: Self-pay

## 2018-06-18 VITALS — BP 99/57 | HR 73 | Temp 98.4°F | Resp 16

## 2018-06-18 DIAGNOSIS — D469 Myelodysplastic syndrome, unspecified: Secondary | ICD-10-CM

## 2018-06-18 DIAGNOSIS — Z452 Encounter for adjustment and management of vascular access device: Secondary | ICD-10-CM

## 2018-06-18 DIAGNOSIS — D61818 Other pancytopenia: Secondary | ICD-10-CM

## 2018-06-18 LAB — CBC WITH DIFFERENTIAL (CANCER CENTER ONLY)
Basophils Absolute: 0 10*3/uL (ref 0.0–0.1)
Basophils Relative: 3 %
EOS PCT: 3 %
Eosinophils Absolute: 0 10*3/uL (ref 0.0–0.5)
HCT: 23.5 % — ABNORMAL LOW (ref 38.4–49.9)
Hemoglobin: 8.1 g/dL — ABNORMAL LOW (ref 13.0–17.1)
Lymphocytes Relative: 81 %
Lymphs Abs: 0.6 10*3/uL — ABNORMAL LOW (ref 0.9–3.3)
MCH: 29.5 pg (ref 27.2–33.4)
MCHC: 34.6 g/dL (ref 32.0–36.0)
MCV: 85.3 fL (ref 79.3–98.0)
MONOS PCT: 10 %
Monocytes Absolute: 0.1 10*3/uL (ref 0.1–0.9)
NEUTROS ABS: 0 10*3/uL — AB (ref 1.5–6.5)
Neutrophils Relative %: 3 %
PLATELETS: 28 10*3/uL — AB (ref 140–400)
RBC: 2.76 MIL/uL — ABNORMAL LOW (ref 4.20–5.82)
RDW: 13.8 % (ref 11.0–14.6)
WBC Count: 0.7 10*3/uL — CL (ref 4.0–10.3)

## 2018-06-18 LAB — BPAM RBC
Blood Product Expiration Date: 201910112359
Blood Product Expiration Date: 201910172359
ISSUE DATE / TIME: 201909240831
ISSUE DATE / TIME: 201909240831
UNIT TYPE AND RH: 5100
Unit Type and Rh: 5100

## 2018-06-18 LAB — TYPE AND SCREEN
ABO/RH(D): O POS
ANTIBODY SCREEN: NEGATIVE
UNIT DIVISION: 0
UNIT DIVISION: 0

## 2018-06-18 MED ORDER — ONDANSETRON HCL 8 MG PO TABS
8.0000 mg | ORAL_TABLET | Freq: Once | ORAL | Status: AC
  Administered 2018-06-18: 8 mg via ORAL

## 2018-06-18 MED ORDER — AZACITIDINE CHEMO SQ INJECTION
150.0000 mg | Freq: Once | INTRAMUSCULAR | Status: AC
  Administered 2018-06-18: 150 mg via SUBCUTANEOUS
  Filled 2018-06-18: qty 6

## 2018-06-18 MED ORDER — HEPARIN SOD (PORK) LOCK FLUSH 100 UNIT/ML IV SOLN
500.0000 [IU] | Freq: Once | INTRAVENOUS | Status: DC | PRN
  Filled 2018-06-18: qty 5

## 2018-06-18 MED ORDER — SODIUM CHLORIDE 0.9% FLUSH
10.0000 mL | INTRAVENOUS | Status: DC | PRN
  Administered 2018-06-18: 10 mL
  Filled 2018-06-18: qty 10

## 2018-06-18 MED ORDER — ONDANSETRON HCL 8 MG PO TABS
ORAL_TABLET | ORAL | Status: AC
  Filled 2018-06-18: qty 1

## 2018-06-18 NOTE — Patient Instructions (Signed)
[facility] Discharge Instructions for Patients Receiving Chemotherapy  Today you received the following chemotherapy agents Vidaza  To help prevent nausea and vomiting after your treatment, we encourage you to take your nausea medication as directed  If you develop nausea and vomiting that is not controlled by your nausea medication, call the clinic.   BELOW ARE SYMPTOMS THAT SHOULD BE REPORTED IMMEDIATELY:  *FEVER GREATER THAN 100.5 F  *CHILLS WITH OR WITHOUT FEVER  NAUSEA AND VOMITING THAT IS NOT CONTROLLED WITH YOUR NAUSEA MEDICATION  *UNUSUAL SHORTNESS OF BREATH  *UNUSUAL BRUISING OR BLEEDING  TENDERNESS IN MOUTH AND THROAT WITH OR WITHOUT PRESENCE OF ULCERS  *URINARY PROBLEMS  *BOWEL PROBLEMS  UNUSUAL RASH Items with * indicate a potential emergency and should be followed up as soon as possible.  Feel free to call the clinic should you have any questions or concerns. The clinic phone number is [phone].  Please show the CHEMO ALERT CARD at check-in to the Emergency Department and triage nurse.   

## 2018-06-18 NOTE — Telephone Encounter (Signed)
[name], NP made aware of panic lab values ANC 0.0 and WBC 0.7. CBC also reviewed and no new orders received.

## 2018-06-19 ENCOUNTER — Inpatient Hospital Stay: Payer: Medicare Other

## 2018-06-19 VITALS — BP 102/57 | HR 79 | Temp 98.8°F | Resp 16

## 2018-06-19 DIAGNOSIS — D61818 Other pancytopenia: Secondary | ICD-10-CM

## 2018-06-19 DIAGNOSIS — D469 Myelodysplastic syndrome, unspecified: Secondary | ICD-10-CM | POA: Diagnosis not present

## 2018-06-19 LAB — CBC WITH DIFFERENTIAL (CANCER CENTER ONLY)
BASOS ABS: 0 10*3/uL (ref 0.0–0.1)
BASOS PCT: 1 %
EOS PCT: 1 %
Eosinophils Absolute: 0 10*3/uL (ref 0.0–0.5)
HCT: 19.7 % — ABNORMAL LOW (ref 38.4–49.9)
Hemoglobin: 6.8 g/dL — CL (ref 13.0–17.1)
Lymphocytes Relative: 83 %
Lymphs Abs: 0.7 10*3/uL — ABNORMAL LOW (ref 0.9–3.3)
MCH: 29.3 pg (ref 27.2–33.4)
MCHC: 34.5 g/dL (ref 32.0–36.0)
MCV: 84.9 fL (ref 79.3–98.0)
MONO ABS: 0.1 10*3/uL (ref 0.1–0.9)
Monocytes Relative: 12 %
Neutro Abs: 0 10*3/uL — CL (ref 1.5–6.5)
Neutrophils Relative %: 3 %
PLATELETS: 5 10*3/uL — AB (ref 140–400)
RBC: 2.32 MIL/uL — AB (ref 4.20–5.82)
RDW: 13.4 % (ref 11.0–14.6)
WBC: 0.9 10*3/uL — AB (ref 4.0–10.3)

## 2018-06-19 MED ORDER — ONDANSETRON HCL 8 MG PO TABS
8.0000 mg | ORAL_TABLET | Freq: Once | ORAL | Status: AC
  Administered 2018-06-19: 8 mg via ORAL

## 2018-06-19 MED ORDER — ONDANSETRON HCL 8 MG PO TABS
ORAL_TABLET | ORAL | Status: AC
  Filled 2018-06-19: qty 1

## 2018-06-19 MED ORDER — AZACITIDINE CHEMO SQ INJECTION
150.0000 mg | Freq: Once | INTRAMUSCULAR | Status: AC
  Administered 2018-06-19: 150 mg via SUBCUTANEOUS
  Filled 2018-06-19: qty 6

## 2018-06-19 NOTE — Patient Instructions (Signed)
[facility] Discharge Instructions for Patients Receiving Chemotherapy  Today you received the following chemotherapy agents Vidaza  To help prevent nausea and vomiting after your treatment, we encourage you to take your nausea medication as directed  If you develop nausea and vomiting that is not controlled by your nausea medication, call the clinic.   BELOW ARE SYMPTOMS THAT SHOULD BE REPORTED IMMEDIATELY:  *FEVER GREATER THAN 100.5 F  *CHILLS WITH OR WITHOUT FEVER  NAUSEA AND VOMITING THAT IS NOT CONTROLLED WITH YOUR NAUSEA MEDICATION  *UNUSUAL SHORTNESS OF BREATH  *UNUSUAL BRUISING OR BLEEDING  TENDERNESS IN MOUTH AND THROAT WITH OR WITHOUT PRESENCE OF ULCERS  *URINARY PROBLEMS  *BOWEL PROBLEMS  UNUSUAL RASH Items with * indicate a potential emergency and should be followed up as soon as possible.  Feel free to call the clinic should you have any questions or concerns. The clinic phone number is [phone].  Please show the CHEMO ALERT CARD at check-in to the Emergency Department and triage nurse.   

## 2018-06-20 ENCOUNTER — Inpatient Hospital Stay: Payer: Medicare Other

## 2018-06-20 ENCOUNTER — Telehealth: Payer: Self-pay | Admitting: *Deleted

## 2018-06-20 VITALS — BP 85/48 | HR 79 | Temp 98.3°F | Resp 17

## 2018-06-20 DIAGNOSIS — D469 Myelodysplastic syndrome, unspecified: Secondary | ICD-10-CM | POA: Diagnosis not present

## 2018-06-20 DIAGNOSIS — D61818 Other pancytopenia: Secondary | ICD-10-CM

## 2018-06-20 DIAGNOSIS — Z452 Encounter for adjustment and management of vascular access device: Secondary | ICD-10-CM

## 2018-06-20 LAB — CBC WITH DIFFERENTIAL (CANCER CENTER ONLY)
BASOS PCT: 1 %
Basophils Absolute: 0 10*3/uL (ref 0.0–0.1)
EOS ABS: 0 10*3/uL (ref 0.0–0.5)
EOS PCT: 0 %
HCT: 23.3 % — ABNORMAL LOW (ref 38.4–49.9)
HEMOGLOBIN: 7.9 g/dL — AB (ref 13.0–17.1)
Lymphocytes Relative: 88 %
Lymphs Abs: 0.6 10*3/uL — ABNORMAL LOW (ref 0.9–3.3)
MCH: 29.4 pg (ref 27.2–33.4)
MCHC: 33.9 g/dL (ref 32.0–36.0)
MCV: 86.6 fL (ref 79.3–98.0)
MONOS PCT: 7 %
Monocytes Absolute: 0.1 10*3/uL (ref 0.1–0.9)
NEUTROS PCT: 4 %
Neutro Abs: 0 10*3/uL — CL (ref 1.5–6.5)
Platelet Count: 10 10*3/uL — CL (ref 140–400)
RBC: 2.69 MIL/uL — ABNORMAL LOW (ref 4.20–5.82)
RDW: 13.6 % (ref 11.0–14.6)
WBC: 0.7 10*3/uL — AB (ref 4.0–10.3)

## 2018-06-20 LAB — SAMPLE TO BLOOD BANK

## 2018-06-20 MED ORDER — HEPARIN SOD (PORK) LOCK FLUSH 100 UNIT/ML IV SOLN
500.0000 [IU] | Freq: Once | INTRAVENOUS | Status: AC | PRN
  Administered 2018-06-20: 500 [IU]
  Filled 2018-06-20: qty 5

## 2018-06-20 MED ORDER — ONDANSETRON HCL 8 MG PO TABS
ORAL_TABLET | ORAL | Status: AC
  Filled 2018-06-20: qty 1

## 2018-06-20 MED ORDER — ONDANSETRON HCL 8 MG PO TABS
8.0000 mg | ORAL_TABLET | Freq: Once | ORAL | Status: AC
  Administered 2018-06-20: 8 mg via ORAL

## 2018-06-20 MED ORDER — SODIUM CHLORIDE 0.9% FLUSH
10.0000 mL | INTRAVENOUS | Status: DC | PRN
  Administered 2018-06-20: 10 mL
  Filled 2018-06-20: qty 10

## 2018-06-20 MED ORDER — AZACITIDINE CHEMO SQ INJECTION
82.0000 mg/m2 | Freq: Once | INTRAMUSCULAR | Status: AC
  Administered 2018-06-20: 150 mg via SUBCUTANEOUS
  Filled 2018-06-20: qty 6

## 2018-06-20 NOTE — Telephone Encounter (Signed)
[place]. Patient has a follow up appointment with DR. Florene Glen on October 8 at 1:45-pm. Patient is aware of the date and time of appointment.

## 2018-06-20 NOTE — Patient Instructions (Signed)
[facility] Discharge Instructions for Patients Receiving Chemotherapy  Today you received the following chemotherapy agents: Azacitidine (Vidaza)  To help prevent nausea and vomiting after your treatment, we encourage you to take your nausea medication as directed.    If you develop nausea and vomiting that is not controlled by your nausea medication, call the clinic.   BELOW ARE SYMPTOMS THAT SHOULD BE REPORTED IMMEDIATELY:  *FEVER GREATER THAN 100.5 F  *CHILLS WITH OR WITHOUT FEVER  NAUSEA AND VOMITING THAT IS NOT CONTROLLED WITH YOUR NAUSEA MEDICATION  *UNUSUAL SHORTNESS OF BREATH  *UNUSUAL BRUISING OR BLEEDING  TENDERNESS IN MOUTH AND THROAT WITH OR WITHOUT PRESENCE OF ULCERS  *URINARY PROBLEMS  *BOWEL PROBLEMS  UNUSUAL RASH Items with * indicate a potential emergency and should be followed up as soon as possible.  Feel free to call the clinic should you have any questions or concerns. The clinic phone number is [phone].  Please show the CHEMO ALERT CARD at check-in to the Emergency Department and triage nurse.   

## 2018-06-20 NOTE — Progress Notes (Signed)
Ok to treat despite labs today per Marlynn Perking  Patient will be scheduled for repeat labs and transfusion on Monday.

## 2018-06-23 ENCOUNTER — Inpatient Hospital Stay (HOSPITAL_BASED_OUTPATIENT_CLINIC_OR_DEPARTMENT_OTHER): Payer: Medicare Other | Admitting: Nurse Practitioner

## 2018-06-23 ENCOUNTER — Encounter: Payer: Self-pay | Admitting: Nurse Practitioner

## 2018-06-23 ENCOUNTER — Telehealth: Payer: Self-pay | Admitting: Nurse Practitioner

## 2018-06-23 ENCOUNTER — Inpatient Hospital Stay: Payer: Medicare Other

## 2018-06-23 VITALS — BP 104/64 | HR 92 | Temp 98.4°F | Resp 20 | Ht 67.01 in | Wt 154.3 lb

## 2018-06-23 DIAGNOSIS — K5730 Diverticulosis of large intestine without perforation or abscess without bleeding: Secondary | ICD-10-CM

## 2018-06-23 DIAGNOSIS — D61818 Other pancytopenia: Secondary | ICD-10-CM

## 2018-06-23 DIAGNOSIS — K056 Periodontal disease, unspecified: Secondary | ICD-10-CM

## 2018-06-23 DIAGNOSIS — Z452 Encounter for adjustment and management of vascular access device: Secondary | ICD-10-CM

## 2018-06-23 DIAGNOSIS — L98498 Non-pressure chronic ulcer of skin of other sites with other specified severity: Secondary | ICD-10-CM

## 2018-06-23 DIAGNOSIS — D7589 Other specified diseases of blood and blood-forming organs: Secondary | ICD-10-CM

## 2018-06-23 DIAGNOSIS — K6289 Other specified diseases of anus and rectum: Secondary | ICD-10-CM | POA: Diagnosis not present

## 2018-06-23 DIAGNOSIS — N289 Disorder of kidney and ureter, unspecified: Secondary | ICD-10-CM

## 2018-06-23 DIAGNOSIS — D469 Myelodysplastic syndrome, unspecified: Secondary | ICD-10-CM | POA: Diagnosis not present

## 2018-06-23 DIAGNOSIS — E119 Type 2 diabetes mellitus without complications: Secondary | ICD-10-CM

## 2018-06-23 DIAGNOSIS — Z79899 Other long term (current) drug therapy: Secondary | ICD-10-CM

## 2018-06-23 LAB — CBC WITH DIFFERENTIAL (CANCER CENTER ONLY)
BASOS ABS: 0 10*3/uL (ref 0.0–0.1)
Basophils Absolute: 0 10*3/uL (ref 0.0–0.1)
Basophils Relative: 0 %
Basophils Relative: 2 %
EOS ABS: 0 10*3/uL (ref 0.0–0.5)
EOS ABS: 0 10*3/uL (ref 0.0–0.5)
EOS PCT: 1 %
EOS PCT: 0 %
HCT: 21.4 % — ABNORMAL LOW (ref 38.4–49.9)
HCT: 19.5 % — ABNORMAL LOW (ref 38.4–49.9)
HEMOGLOBIN: 6.7 g/dL — AB (ref 13.0–17.1)
Hemoglobin: 7.4 g/dL — ABNORMAL LOW (ref 13.0–17.1)
LYMPHS PCT: 89 %
Lymphocytes Relative: 92 %
Lymphs Abs: 0.5 10*3/uL — ABNORMAL LOW (ref 0.9–3.3)
Lymphs Abs: 0.5 10*3/uL — ABNORMAL LOW (ref 0.9–3.3)
MCH: 29.8 pg (ref 27.2–33.4)
MCH: 29.5 pg (ref 27.2–33.4)
MCHC: 34.7 g/dL (ref 32.0–36.0)
MCHC: 34.4 g/dL (ref 32.0–36.0)
MCV: 85.8 fL (ref 79.3–98.0)
MCV: 85.9 fL (ref 79.3–98.0)
Monocytes Absolute: 0 10*3/uL — ABNORMAL LOW (ref 0.1–0.9)
Monocytes Absolute: 0 10*3/uL — ABNORMAL LOW (ref 0.1–0.9)
Monocytes Relative: 5 %
Monocytes Relative: 7 %
NEUTROS PCT: 2 %
Neutro Abs: 0 10*3/uL — CL (ref 1.5–6.5)
Neutro Abs: 0 10*3/uL — CL (ref 1.5–6.5)
Neutrophils Relative %: 2 %
PLATELETS: 5 10*3/uL — AB (ref 140–400)
PLATELETS: 34 10*3/uL — AB (ref 140–400)
RBC: 2.5 MIL/uL — AB (ref 4.20–5.82)
RBC: 2.27 MIL/uL — AB (ref 4.20–5.82)
RDW: 13.5 % (ref 11.0–14.6)
RDW: 13.2 % (ref 11.0–14.6)
WBC: 0.6 10*3/uL — AB (ref 4.0–10.3)
WBC: 0.6 10*3/uL — AB (ref 4.0–10.3)

## 2018-06-23 MED ORDER — SODIUM CHLORIDE 0.9% FLUSH
10.0000 mL | INTRAVENOUS | Status: DC | PRN
  Filled 2018-06-23: qty 10

## 2018-06-23 MED ORDER — SODIUM CHLORIDE 0.9% IV SOLUTION
250.0000 mL | Freq: Once | INTRAVENOUS | Status: AC
  Administered 2018-06-23: 250 mL via INTRAVENOUS
  Filled 2018-06-23: qty 250

## 2018-06-23 MED ORDER — HEPARIN SOD (PORK) LOCK FLUSH 100 UNIT/ML IV SOLN
500.0000 [IU] | Freq: Once | INTRAVENOUS | Status: DC | PRN
  Filled 2018-06-23: qty 5

## 2018-06-23 MED ORDER — SODIUM CHLORIDE 0.9% FLUSH
3.0000 mL | INTRAVENOUS | Status: AC | PRN
  Administered 2018-06-23: 3 mL
  Filled 2018-06-23: qty 10

## 2018-06-23 MED ORDER — ACETAMINOPHEN 325 MG PO TABS
ORAL_TABLET | ORAL | Status: AC
  Filled 2018-06-23: qty 2

## 2018-06-23 MED ORDER — ACETAMINOPHEN 325 MG PO TABS
650.0000 mg | ORAL_TABLET | Freq: Once | ORAL | Status: AC
  Administered 2018-06-23: 650 mg via ORAL

## 2018-06-23 MED ORDER — HEPARIN SOD (PORK) LOCK FLUSH 100 UNIT/ML IV SOLN
250.0000 [IU] | INTRAVENOUS | Status: AC | PRN
  Administered 2018-06-23: 250 [IU]
  Filled 2018-06-23: qty 5

## 2018-06-23 NOTE — Progress Notes (Addendum)
[facility] OFFICE PROGRESS NOTE   Diagnosis: Myelodysplasia  INTERVAL HISTORY:   Mr. Lacomb returns as scheduled.  He completed cycle two 5-azacytidine beginning 06/16/2018.  He denies fever.  No shaking chills.  No bleeding.  No cough or shortness of breath.  No dysuria.  He reports rectal pain for many weeks.  He notes that stools are hard.  He is taking a stool softener.  Objective:  Vital signs in last 24 hours:  Blood pressure 104/64, pulse 92, temperature 98.4 F (36.9 C), temperature source Oral, resp. rate 20, height 5' 7.01" (1.702 m), weight 154 lb 4.8 oz (70 kg), SpO2 100 %.    HEENT: White coating over tongue.  Filmy white appearance over bilateral buccal regions.  No ulcerations. Resp: Lungs clear bilaterally. Cardio: Regular rate and rhythm. GI: Abdomen soft and nontender.  No hepatomegaly. Vascular: No leg edema. Neuro: Alert and oriented. Skin: Necrotic appearing ulceration upper gluteal cleft.  Several smaller ulcerations involving perianal skin. Right upper extremity PICC without erythema.  Lab Results:  Lab Results  Component Value Date   WBC 0.6 (LL) 06/23/2018   HGB 7.4 (L) 06/23/2018   HCT 21.4 (L) 06/23/2018   MCV 85.8 06/23/2018   PLT 5 (LL) 06/23/2018   NEUTROABS 0.0 (LL) 06/23/2018    Imaging:  No results found.  Medications: I have reviewed the patient's current medications.  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  Cycle 1 5-azacytidine 05/06/2018  Cycle 2 5-azacytidine 06/16/2018 2.Renal insufficiency 3.Diabetes 4.Hemoccult positive stool  Upper endoscopy 04/03/2018-chronic gastritis,  duodenitis  Colonoscopy 04/04/2018-diverticulosis in the sigmoid colon 5.Right lower lobe 7 mm subpleural nodule on chest CT 04/02/2018 6.Admission with febrile neutropenia 05/20/2018- placed on cefepime, antibiotics changed to vancomycin/meropenem on 05/21/2018, anidulafungin added 9/1/2019antibiotics stopped 05/31/2018, discharge 06/03/2018 with prophylactic acyclovir, Bactrim, and Diflucan. Bactrim discontinued and Levaquin started 06/05/2018 7. 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 Febrile reaction following a platelet transfusion 9/5/2019and 06/05/2018  Disposition: Mr. Cornia appears unchanged.  He has completed 2 cycles of 5-azacytidine.  He continues to have severe pancytopenia.  He will receive a platelet transfusion today, 30-minute post platelet count.    He has several skin ulcerations along the gluteal cleft/perianal skin.  We will ask the hospital skin care nurse to evaluate while he is here today.  He has scheduled follow-up at [facility] on 07/01/2018.  He will return for lab and follow-up here on 06/25/2018.  He will contact the office in the interim with any problems.  We specifically discussed fever, chills, other signs of infection, bleeding.  Patient seen with Dr. [name].  25 minutes were spent face-to-face at today's visit with the majority of that time involved in counseling/coordination of care.    [name] ANP/GNP-BC   06/23/2018  10:04 AM  This was a shared visit with [name].  Mr. Mitrano was interviewed and examined.  He has developed ulcerations versus fistula tracks at the perineum.  He will begin sitz baths. Mr. Liskey will call us for a fever or bleeding.  He has persistent severe pancytopenia secondary to myelodysplasia.  He is scheduled for an appointment at the [facility] transplant clinic next week.  He will receive a platelet transfusion today. [name], MD

## 2018-06-23 NOTE — Telephone Encounter (Signed)
TC to Andrew [name] per Lattie Haw about wound and ulcers on his rectum. Instructions were to do sitz bath three times a day. Pt verbalized understanding and will start today.

## 2018-06-23 NOTE — Telephone Encounter (Signed)
No 9/30 los.

## 2018-06-23 NOTE — Patient Instructions (Signed)
Platelet Transfusion, Care After °Refer to this sheet in the next few weeks. These instructions provide you with information about caring for yourself after your procedure. Your health care provider may also give you more specific instructions. Your treatment has been planned according to current medical practices, but problems sometimes occur. Call your health care provider if you have any problems or questions after your procedure. °What can I expect after the procedure? °After the procedure, it is common to have: °· Bruising and soreness at the IV site. °· Fever or chills within the first 48 hours of your transfusion. ° °Follow these instructions at home: °· Take medicines only as directed by your health care provider. Ask your health care provider if you can take an over-the-counter pain reliever in case you have a fever or headache a day or two after your transfusion. °· Return to your normal activities as directed by your health care provider. °Contact a health care provider if: °· You have a fever. °· You have a headache. °· You have redness, swelling, or pain at your IV site. °· You have skin itching or a rash. °· You vomit. °· You feel unusually tired or weak. °Get help right away if: °· You have trouble breathing. °· You have a decreased amount of urine or you urinate less often than you normally do. °· Your urine is darker than normal. °· You have pain in your back, abdomen, or chest. °· You have cool, clammy skin. °· You have a rapid heartbeat. °This information is not intended to replace advice given to you by your health care provider. Make sure you discuss any questions you have with your health care provider. °Document Released: 10/01/2014 Document Revised: 02/16/2016 Document Reviewed: 07/21/2014 °Elsevier Interactive Patient Education © 2018 Elsevier Inc. ° °

## 2018-06-24 ENCOUNTER — Other Ambulatory Visit: Payer: Self-pay | Admitting: *Deleted

## 2018-06-24 LAB — PREPARE PLATELET PHERESIS: UNIT DIVISION: 0

## 2018-06-24 LAB — BPAM PLATELET PHERESIS
BLOOD PRODUCT EXPIRATION DATE: 201909302359
ISSUE DATE / TIME: 201909301132
Unit Type and Rh: 5100

## 2018-06-24 MED ORDER — LEVOFLOXACIN 500 MG PO TABS: 500.0000 mg | ORAL_TABLET | Freq: Every day | ORAL | 1 refills | Status: DC

## 2018-06-25 ENCOUNTER — Inpatient Hospital Stay: Payer: Medicare Other

## 2018-06-25 ENCOUNTER — Inpatient Hospital Stay: Payer: Medicare Other | Attending: Oncology | Admitting: Oncology

## 2018-06-25 ENCOUNTER — Telehealth: Payer: Self-pay | Admitting: Oncology

## 2018-06-25 VITALS — BP 111/61 | HR 96 | Temp 98.6°F | Resp 19 | Ht 67.01 in | Wt 154.3 lb

## 2018-06-25 DIAGNOSIS — I959 Hypotension, unspecified: Secondary | ICD-10-CM | POA: Insufficient documentation

## 2018-06-25 DIAGNOSIS — K626 Ulcer of anus and rectum: Secondary | ICD-10-CM | POA: Insufficient documentation

## 2018-06-25 DIAGNOSIS — Z79899 Other long term (current) drug therapy: Secondary | ICD-10-CM | POA: Diagnosis not present

## 2018-06-25 DIAGNOSIS — D61818 Other pancytopenia: Secondary | ICD-10-CM | POA: Insufficient documentation

## 2018-06-25 DIAGNOSIS — K056 Periodontal disease, unspecified: Secondary | ICD-10-CM | POA: Insufficient documentation

## 2018-06-25 DIAGNOSIS — D469 Myelodysplastic syndrome, unspecified: Secondary | ICD-10-CM | POA: Insufficient documentation

## 2018-06-25 DIAGNOSIS — Z452 Encounter for adjustment and management of vascular access device: Secondary | ICD-10-CM

## 2018-06-25 DIAGNOSIS — R634 Abnormal weight loss: Secondary | ICD-10-CM | POA: Diagnosis not present

## 2018-06-25 DIAGNOSIS — D701 Agranulocytosis secondary to cancer chemotherapy: Secondary | ICD-10-CM | POA: Insufficient documentation

## 2018-06-25 LAB — CBC WITH DIFFERENTIAL (CANCER CENTER ONLY)
Basophils Absolute: 0 10*3/uL (ref 0.0–0.1)
Basophils Relative: 0 %
EOS ABS: 0 10*3/uL (ref 0.0–0.5)
Eosinophils Relative: 0 %
HEMATOCRIT: 18.5 % — AB (ref 38.4–49.9)
Hemoglobin: 6.3 g/dL — CL (ref 13.0–17.1)
LYMPHS ABS: 0.4 10*3/uL — AB (ref 0.9–3.3)
Lymphocytes Relative: 86 %
MCH: 29 pg (ref 27.2–33.4)
MCHC: 34.1 g/dL (ref 32.0–36.0)
MCV: 85.3 fL (ref 79.3–98.0)
MONOS PCT: 12 %
Monocytes Absolute: 0.1 10*3/uL (ref 0.1–0.9)
Neutro Abs: 0 10*3/uL — CL (ref 1.5–6.5)
Neutrophils Relative %: 2 %
Platelet Count: 21 10*3/uL — ABNORMAL LOW (ref 140–400)
RBC: 2.17 MIL/uL — ABNORMAL LOW (ref 4.20–5.82)
RDW: 13 % (ref 11.0–14.6)
WBC: 0.5 10*3/uL — AB (ref 4.0–10.3)

## 2018-06-25 MED ORDER — HEPARIN SOD (PORK) LOCK FLUSH 100 UNIT/ML IV SOLN
500.0000 [IU] | Freq: Once | INTRAVENOUS | Status: AC | PRN
  Administered 2018-06-25: 500 [IU]
  Filled 2018-06-25: qty 5

## 2018-06-25 MED ORDER — SODIUM CHLORIDE 0.9% FLUSH
10.0000 mL | INTRAVENOUS | Status: DC | PRN
  Administered 2018-06-25: 10 mL
  Filled 2018-06-25: qty 10

## 2018-06-25 MED ORDER — TRAMADOL HCL 50 MG PO TABS: 50.0000 mg | ORAL_TABLET | Freq: Four times a day (QID) | ORAL | 0 refills | Status: AC | PRN

## 2018-06-25 MED FILL — traMADol HCL 50 MG TABS: 50 | 7 days supply | Qty: 30 | Fill #0

## 2018-06-25 MED FILL — CHLORHEXIDINE 0.12% RINSE: 0.12 | 15 days supply | Qty: 473 | Fill #0

## 2018-06-25 NOTE — Telephone Encounter (Signed)
Scheduled apt per 10/2 los - pt is aware of appts being added - says he will come to scheduling tomorrow 10/3 for a print out.

## 2018-06-25 NOTE — Addendum Note (Signed)
Addended by: Betsy Coder B on: 06/25/2018 03:12 PM   Modules accepted: Orders

## 2018-06-25 NOTE — Patient Instructions (Signed)
PICC Home Guide °A peripherally inserted central catheter (PICC) is a long, thin, flexible tube that is inserted into a vein in the upper arm. It is a form of intravenous (IV) access. It is considered to be a "central" line because the tip of the PICC ends in a large vein in your chest. This large vein is called the superior vena cava (SVC). The PICC tip ends in the SVC because there is a lot of blood flow in the SVC. This allows medicines and IV fluids to be quickly distributed throughout the body. The PICC is inserted using a sterile technique by a specially trained nurse or physician. After the PICC is inserted, a chest X-ray exam is done to be sure it is in the correct place. °A PICC may be placed for different reasons, such as: °· To give medicines and liquid nutrition that can only be given through a central line. Examples are: °? Certain antibiotic treatments. °? Chemotherapy. °? Total parenteral nutrition (TPN). °· To take frequent blood samples. °· To give IV fluids and blood products. °· If there is difficulty placing a peripheral intravenous (PIV) catheter. ° °If taken care of properly, a PICC can remain in place for several months. A PICC can also allow a person to go home from the hospital early. Medicine and PICC care can be managed at home by a family member or home health care team. °What problems can happen when I have a PICC? °Problems with a PICC can occasionally occur. These may include the following: °· A blood clot (thrombus) forming in or at the tip of the PICC. This can cause the PICC to become clogged. A clot-dissolving medicine called tissue plasminogen activator (tPA) can be given through the PICC to help break up the clot. °· Inflammation of the vein (phlebitis) in which the PICC is placed. Signs of inflammation may include redness, pain at the insertion site, red streaks, or being able to feel a "cord" in the vein where the PICC is located. °· Infection in the PICC or at the insertion  site. Signs of infection may include fever, chills, redness, swelling, or pus drainage from the PICC insertion site. °· PICC movement (malposition). The PICC tip may move from its original position due to excessive physical activity, forceful coughing, sneezing, or vomiting. °· A break or cut in the PICC. It is important to not use scissors near the PICC. °· Nerve or tendon irritation or injury during PICC insertion. ° °What should I keep in mind about activities when I have a PICC? °· You may bend your arm and move it freely. If your PICC is near or at the bend of your elbow, avoid activity with repeated motion at the elbow. °· Rest at home for the remainder of the day following PICC line insertion. °· Avoid lifting heavy objects as instructed by your health care provider. °· Avoid using a crutch with the arm on the same side as your PICC. You may need to use a walker. °What should I know about my PICC dressing? °· Keep your PICC bandage (dressing) clean and dry to prevent infection. °? Ask your health care provider when you may shower. Ask your health care provider to teach you how to wrap the PICC when you do take a shower. °· Change the PICC dressing as instructed by your health care provider. °· Change your PICC dressing if it becomes loose or wet. °What should I know about PICC care? °· Check the PICC insertion   site daily for leakage, redness, swelling, or pain. °· Do not take a bath, swim, or use hot tubs when you have a PICC. Cover PICC line with clear plastic wrap and tape to keep it dry while showering. °· Flush the PICC as directed by your health care provider. Let your health care provider know right away if the PICC is difficult to flush or does not flush. Do not use force to flush the PICC. °· Do not use a syringe that is less than 10 mL to flush the PICC. °· Never pull or tug on the PICC. °· Avoid blood pressure checks on the arm with the PICC. °· Keep your PICC identification card with you at all  times. °· Do not take the PICC out yourself. Only a trained clinical professional should remove the PICC. °Get help right away if: °· Your PICC is accidentally pulled all the way out. If this happens, cover the insertion site with a bandage or gauze dressing. Do not throw the PICC away. Your health care provider will need to inspect it. °· Your PICC was tugged or pulled and has partially come out. Do not  push the PICC back in. °· There is any type of drainage, redness, or swelling where the PICC enters the skin. °· You cannot flush the PICC, it is difficult to flush, or the PICC leaks around the insertion site when it is flushed. °· You hear a "flushing" sound when the PICC is flushed. °· You have pain, discomfort, or numbness in your arm, shoulder, or jaw on the same side as the PICC. °· You feel your heart "racing" or skipping beats. °· You notice a hole or tear in the PICC. °· You develop chills or a fever. °This information is not intended to replace advice given to you by your health care provider. Make sure you discuss any questions you have with your health care provider. °Document Released: 03/17/2003 Document Revised: 03/30/2016 Document Reviewed: 07/03/2013 °Elsevier Interactive Patient Education © 2017 [employer]vier Inc. ° °

## 2018-06-25 NOTE — Progress Notes (Signed)
  [facility] OFFICE PROGRESS NOTE   Diagnosis: Myelodysplasia, pancytopenia  INTERVAL HISTORY:   Andrew [name] returns as scheduled.  He feels well.  No fever or bleeding.  He continues to have discomfort at the rectum.  He is performing sitz bath's.  He has not refilled Diflucan.  Objective:  Vital signs in last 24 hours:  Blood pressure 111/61, pulse 96, temperature 98.6 F (37 C), temperature source Oral, resp. rate 19, height 5' 7.01" (1.702 m), weight 154 lb 4.8 oz (70 kg), SpO2 100 %.    HEENT: Thick white coat over the tongue, no buccal thrush, no bleeding Resp: Lungs clear bilaterally Cardio: Regular rate and rhythm GI: No hepatospleno megaly, nontender Vascular: No leg edema  Skin: Ecchymoses and induration at abdominal wall injection sites Rectal: Ulceration at the upper left side of the perineum and immediately adjacent to the anal verge, no drainage  Portacath/PICC-without erythema  Lab Results:  Lab Results  Component Value Date   WBC 0.5 (LL) 06/25/2018   HGB 6.3 (LL) 06/25/2018   HCT 18.5 (L) 06/25/2018   MCV 85.3 06/25/2018   PLT 21 (L) 06/25/2018   NEUTROABS 0.0 (LL) 06/25/2018    CMP  Lab Results  Component Value Date   NA 132 (L) 06/16/2018   K 4.4 06/16/2018   CL 98 06/16/2018   CO2 24 06/16/2018   GLUCOSE 104 (H) 06/16/2018   BUN 40 (H) 06/16/2018   CREATININE 1.42 (H) 06/16/2018   CALCIUM 9.0 06/16/2018   PROT 8.1 06/16/2018   ALBUMIN 2.2 (L) 06/16/2018   AST 16 06/16/2018   ALT 22 06/16/2018   ALKPHOS 142 (H) 06/16/2018   BILITOT 0.5 06/16/2018   GFRNONAA 50 (L) 06/16/2018   GFRAA 58 (L) 06/16/2018     Medications: I have reviewed the patient's current medications.   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  Cycle 1 5-azacytidine 05/06/2018  Cycle25-azacytidine 06/16/2018 2.Renal insufficiency 3.Diabetes 4.Hemoccult positive stool  Upper endoscopy 04/03/2018-chronic gastritis, duodenitis  Colonoscopy 04/04/2018-diverticulosis in the sigmoid colon 5.Right lower lobe 7 mm subpleural nodule on chest CT 04/02/2018 6.Admission with febrile neutropenia 05/20/2018- placed on cefepime, antibiotics changed to vancomycin/meropenem on 05/21/2018, anidulafungin added 9/1/2019antibiotics stopped 05/31/2018, discharge 06/03/2018 with prophylactic acyclovir, Bactrim, and Diflucan. Bactrim discontinued and Levaquin started 06/05/2018 7. 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 Febrile reaction following a platelet transfusion 9/5/2019and 06/05/2018 11.  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  He has persistent severe pancytopenia.  He will be scheduled for a platelet transfusion and red cell transfusion on 06/27/2018.  He will continue sitz baths for the perineal ulcerations. We will be sure he resumes the Diflucan for prophylaxis.  Andrew [name] scheduled for an appointment at [facility] on 07/01/2018.  He will return for a lab visit and catheter care on 06/30/2018.  He will be scheduled for an office visit on 07/03/2018.  Andrew Coder, MD  06/25/2018  2:55 PM

## 2018-06-25 NOTE — Addendum Note (Signed)
Addended by: Jethro Bolus A on: 06/25/2018 04:01 PM   Modules accepted: Orders, SmartSet

## 2018-06-26 ENCOUNTER — Inpatient Hospital Stay: Payer: Medicare Other

## 2018-06-26 DIAGNOSIS — D61818 Other pancytopenia: Secondary | ICD-10-CM

## 2018-06-26 DIAGNOSIS — D469 Myelodysplastic syndrome, unspecified: Secondary | ICD-10-CM | POA: Diagnosis not present

## 2018-06-27 ENCOUNTER — Other Ambulatory Visit: Payer: Self-pay

## 2018-06-27 ENCOUNTER — Inpatient Hospital Stay: Payer: Medicare Other

## 2018-06-27 DIAGNOSIS — D469 Myelodysplastic syndrome, unspecified: Secondary | ICD-10-CM | POA: Diagnosis not present

## 2018-06-27 DIAGNOSIS — D61818 Other pancytopenia: Secondary | ICD-10-CM

## 2018-06-27 LAB — PREPARE RBC (CROSSMATCH)

## 2018-06-27 MED ORDER — SODIUM CHLORIDE 0.9% FLUSH
3.0000 mL | INTRAVENOUS | Status: DC | PRN
  Filled 2018-06-27: qty 10

## 2018-06-27 MED ORDER — ACETAMINOPHEN 325 MG PO TABS
ORAL_TABLET | ORAL | Status: AC
  Filled 2018-06-27: qty 2

## 2018-06-27 MED ORDER — SODIUM CHLORIDE 0.9% IV SOLUTION
250.0000 mL | Freq: Once | INTRAVENOUS | Status: AC
  Administered 2018-06-27: 250 mL via INTRAVENOUS
  Filled 2018-06-27: qty 250

## 2018-06-27 MED ORDER — SODIUM CHLORIDE 0.9% FLUSH
10.0000 mL | INTRAVENOUS | Status: AC | PRN
  Administered 2018-06-27: 10 mL
  Filled 2018-06-27: qty 10

## 2018-06-27 MED ORDER — ACETAMINOPHEN 325 MG PO TABS
650.0000 mg | ORAL_TABLET | Freq: Once | ORAL | Status: AC
  Administered 2018-06-27: 650 mg via ORAL

## 2018-06-27 MED ORDER — HEPARIN SOD (PORK) LOCK FLUSH 100 UNIT/ML IV SOLN
250.0000 [IU] | INTRAVENOUS | Status: AC | PRN
  Administered 2018-06-27: 250 [IU]
  Filled 2018-06-27: qty 5

## 2018-06-27 MED ORDER — DIPHENHYDRAMINE HCL 25 MG PO CAPS
25.0000 mg | ORAL_CAPSULE | Freq: Once | ORAL | Status: AC
  Administered 2018-06-27: 25 mg via ORAL

## 2018-06-27 MED ORDER — DIPHENHYDRAMINE HCL 25 MG PO CAPS
ORAL_CAPSULE | ORAL | Status: AC
  Filled 2018-06-27: qty 1

## 2018-06-27 NOTE — Patient Instructions (Signed)
Blood Transfusion, Care After This sheet gives you information about how to care for yourself after your procedure. Your doctor may also give you more specific instructions. If you have problems or questions, contact your doctor. Follow these instructions at home:  Take over-the-counter and prescription medicines only as told by your doctor.  Go back to your normal activities as told by your doctor.  Follow instructions from your doctor about how to take care of the area where an IV tube was put into your vein (insertion site). Make sure you: ? Wash your hands with soap and water before you change your bandage (dressing). If there is no soap and water, use hand sanitizer. ? Change your bandage as told by your doctor.  Check your IV insertion site every day for signs of infection. Check for: ? More redness, swelling, or pain. ? More fluid or blood. ? Warmth. ? Pus or a bad smell. Contact a doctor if:  You have more redness, swelling, or pain around the IV insertion site..  You have more fluid or blood coming from the IV insertion site.  Your IV insertion site feels warm to the touch.  You have pus or a bad smell coming from the IV insertion site.  Your pee (urine) turns pink, red, or brown.  You feel weak after doing your normal activities. Get help right away if:  You have signs of a serious allergic or body defense (immune) system reaction, including: ? Itchiness. ? Hives. ? Trouble breathing. ? Anxiety. ? Pain in your chest or lower back. ? Fever, flushing, and chills. ? Fast pulse. ? Rash. ? Watery poop (diarrhea). ? Throwing up (vomiting). ? Dark pee. ? Serious headache. ? Dizziness. ? Stiff neck. ? Yellow color in your face or the white parts of your eyes (jaundice). Summary  After a blood transfusion, return to your normal activities as told by your doctor.  Every day, check for signs of infection where the IV tube was put into your vein.  Some signs of  infection are warm skin, more redness and pain, more fluid or blood, and pus or a bad smell where the needle went in.  Contact your doctor if you feel weak or have any unusual symptoms. This information is not intended to replace advice given to you by your health care provider. Make sure you discuss any questions you have with your health care provider. Document Released: 10/01/2014 Document Revised: 05/04/2016 Document Reviewed: 05/04/2016 Elsevier Interactive Patient Education  2017 [employer].   Platelet Transfusion A platelet transfusion is a procedure in which you receive donated platelets through an IV tube. Platelets are tiny pieces of blood cells. When a blood vessel is damaged, platelets collect in the damaged area to help form a blood clot. This begins the healing process. If your platelet count gets too low, your blood may have trouble clotting. You may need a platelet transfusion if you have a condition that causes a low number of platelets (thrombocytopenia). A platelet transfusion may be used to stop or prevent bleeding. Tell a health care provider about:  Any allergies you have.  All medicines you are taking, including vitamins, herbs, eye drops, creams, and over-the-counter medicines.  Any problems you or family members have had with anesthetic medicines.  Any blood disorders you have.  Any surgeries you have had.  Any medical conditions you have.  Any reactions you have had during a previous transfusion. What are the risks? Generally, this is a safe procedure.  However, problems may occur, including:  Fever with or without chills. The fever usually occurs within the first 4 hours of the transfusion and returns to normal within 48 hours.  Allergic reaction. The reaction is most commonly caused by antibodies your body creates against substances in the transfusion. Signs of an allergic reaction may include itching, hives, difficulty breathing, shock, or low blood  pressure.  Sudden (acute) or delayed hemolytic reaction. This rare reaction can occur during the transfusion and up to 28 days after the transfusion. The reaction usually occurs when your body's defense system (immune system) attacks the new platelets. Signs of a hemolytic reaction may include fever, headache, difficulty breathing, low blood pressure, a rapid heartbeat, or pain in your back, abdomen, chest, or IV site.  Transfusion-related acute lung injury (TRALI). TRALI can occur within hours of a transfusion, or several days later. This is a rare reaction that causes lung damage. The cause is not known.  Infection. Signs of this rare complication may include fever, chills, vomiting, a rapid heartbeat, or low blood pressure.  What happens before the procedure?  You may have a blood test to determine your blood type. This is necessary to find out what kind ofplatelets best matches your platelets.  If you have had an allergic reaction to a transfusion in the past, you may be given medicine to help prevent a reaction. Take this medicine only as directed by your health care provider.  Your temperature, blood pressure, and pulse will be monitored before the transfusion. What happens during the procedure?  An IV will be started in your hand or arm.  The transfusion will be attached to your IV tubing. The bag of donated platelets will be attached to your IV tube andgiven into your vein.  Your temperature, blood pressure, and pulse will be monitored regularly during the transfusion. This monitoring is done to help detect early signs of a transfusion reaction.  If you have any signs or symptoms of a reaction, your transfusion will be stopped and you may be given medicine.  When your transfusion is complete, your IV will be removed.  Pressure may be applied to the IV site for a few minutes.  A bandage (dressing) will be applied. The procedure may vary among health care providers and  hospitals. What happens after the procedure?  Your blood pressure, temperature, and pulse will be monitored regularly. This information is not intended to replace advice given to you by your health care provider. Make sure you discuss any questions you have with your health care provider. Document Released: 07/08/2007 Document Revised: 02/16/2016 Document Reviewed: 07/21/2014 Elsevier Interactive Patient Education  2018 [employer].

## 2018-06-30 ENCOUNTER — Inpatient Hospital Stay: Payer: Medicare Other

## 2018-06-30 ENCOUNTER — Telehealth: Payer: Self-pay | Admitting: Nurse Practitioner

## 2018-06-30 VITALS — BP 112/74 | HR 99 | Temp 98.2°F | Resp 16

## 2018-06-30 DIAGNOSIS — D61818 Other pancytopenia: Secondary | ICD-10-CM

## 2018-06-30 DIAGNOSIS — Z452 Encounter for adjustment and management of vascular access device: Secondary | ICD-10-CM

## 2018-06-30 DIAGNOSIS — D469 Myelodysplastic syndrome, unspecified: Secondary | ICD-10-CM | POA: Diagnosis not present

## 2018-06-30 LAB — PREPARE PLATELET PHERESIS: Unit division: 0

## 2018-06-30 LAB — CBC WITH DIFFERENTIAL (CANCER CENTER ONLY)
BASOS PCT: 2 %
Basophils Absolute: 0 10*3/uL (ref 0.0–0.1)
EOS ABS: 0 10*3/uL (ref 0.0–0.5)
Eosinophils Relative: 0 %
HEMATOCRIT: 19.4 % — AB (ref 38.4–49.9)
Hemoglobin: 6.6 g/dL — CL (ref 13.0–17.1)
Lymphocytes Relative: 79 %
Lymphs Abs: 0.4 10*3/uL — ABNORMAL LOW (ref 0.9–3.3)
MCH: 29.3 pg (ref 27.2–33.4)
MCHC: 34 g/dL (ref 32.0–36.0)
MCV: 86.2 fL (ref 79.3–98.0)
MONO ABS: 0.1 10*3/uL (ref 0.1–0.9)
MONOS PCT: 17 %
NEUTROS ABS: 0 10*3/uL — AB (ref 1.5–6.5)
Neutrophils Relative %: 2 %
PLATELETS: 19 10*3/uL — AB (ref 140–400)
RBC: 2.25 MIL/uL — ABNORMAL LOW (ref 4.20–5.82)
RDW: 12.8 % (ref 11.0–14.6)
WBC Count: 0.5 10*3/uL — CL (ref 4.0–10.3)

## 2018-06-30 LAB — SAMPLE TO BLOOD BANK

## 2018-06-30 LAB — BPAM RBC
Blood Product Expiration Date: 201910272359
ISSUE DATE / TIME: 201910041503
Unit Type and Rh: 5100

## 2018-06-30 LAB — TYPE AND SCREEN
ABO/RH(D): O POS
Antibody Screen: NEGATIVE
Unit division: 0

## 2018-06-30 LAB — BPAM PLATELET PHERESIS
BLOOD PRODUCT EXPIRATION DATE: 201910042359
ISSUE DATE / TIME: 201910041410
Unit Type and Rh: 5100

## 2018-06-30 MED ORDER — SODIUM CHLORIDE 0.9% FLUSH
10.0000 mL | INTRAVENOUS | Status: DC | PRN
  Administered 2018-06-30: 10 mL
  Filled 2018-06-30: qty 10

## 2018-06-30 MED ORDER — HEPARIN SOD (PORK) LOCK FLUSH 100 UNIT/ML IV SOLN
500.0000 [IU] | Freq: Once | INTRAVENOUS | Status: AC | PRN
  Administered 2018-06-30: 500 [IU]
  Filled 2018-06-30: qty 5

## 2018-06-30 NOTE — Patient Instructions (Signed)
Implanted Port Home Guide An implanted port is a type of central line that is placed under the skin. Central lines are used to provide IV access when treatment or nutrition needs to be given through a person's veins. Implanted ports are used for long-term IV access. An implanted port may be placed because:  You need IV medicine that would be irritating to the small veins in your hands or arms.  You need long-term IV medicines, such as antibiotics.  You need IV nutrition for a long period.  You need frequent blood draws for lab tests.  You need dialysis.  Implanted ports are usually placed in the chest area, but they can also be placed in the upper arm, the abdomen, or the leg. An implanted port has two main parts:  Reservoir. The reservoir is round and will appear as a small, raised area under your skin. The reservoir is the part where a needle is inserted to give medicines or draw blood.  Catheter. The catheter is a thin, flexible tube that extends from the reservoir. The catheter is placed into a large vein. Medicine that is inserted into the reservoir goes into the catheter and then into the vein.  How will I care for my incision site? Do not get the incision site wet. Bathe or shower as directed by your health care provider. How is my port accessed? Special steps must be taken to access the port:  Before the port is accessed, a numbing cream can be placed on the skin. This helps numb the skin over the port site.  Your health care provider uses a sterile technique to access the port. ? Your health care provider must put on a mask and sterile gloves. ? The skin over your port is cleaned carefully with an antiseptic and allowed to dry. ? The port is gently pinched between sterile gloves, and a needle is inserted into the port.  Only "non-coring" port needles should be used to access the port. Once the port is accessed, a blood return should be checked. This helps ensure that the port  is in the vein and is not clogged.  If your port needs to remain accessed for a constant infusion, a clear (transparent) bandage will be placed over the needle site. The bandage and needle will need to be changed every week, or as directed by your health care provider.  Keep the bandage covering the needle clean and dry. Do not get it wet. Follow your health care provider's instructions on how to take a shower or bath while the port is accessed.  If your port does not need to stay accessed, no bandage is needed over the port.  What is flushing? Flushing helps keep the port from getting clogged. Follow your health care provider's instructions on how and when to flush the port. Ports are usually flushed with saline solution or a medicine called heparin. The need for flushing will depend on how the port is used.  If the port is used for intermittent medicines or blood draws, the port will need to be flushed: ? After medicines have been given. ? After blood has been drawn. ? As part of routine maintenance.  If a constant infusion is running, the port may not need to be flushed.  How long will my port stay implanted? The port can stay in for as long as your health care provider thinks it is needed. When it is time for the port to come out, surgery will be   done to remove it. The procedure is similar to the one performed when the port was put in. When should I seek immediate medical care? When you have an implanted port, you should seek immediate medical care if:  You notice a bad smell coming from the incision site.  You have swelling, redness, or drainage at the incision site.  You have more swelling or pain at the port site or the surrounding area.  You have a fever that is not controlled with medicine.  This information is not intended to replace advice given to you by your health care provider. Make sure you discuss any questions you have with your health care provider. Document  Released: 09/10/2005 Document Revised: 02/16/2016 Document Reviewed: 05/18/2013 Elsevier Interactive Patient Education  2017 Elsevier Inc.  

## 2018-06-30 NOTE — Telephone Encounter (Signed)
Called regarding 10/9

## 2018-07-01 ENCOUNTER — Other Ambulatory Visit: Payer: Self-pay | Admitting: Nurse Practitioner

## 2018-07-01 ENCOUNTER — Telehealth: Payer: Self-pay | Admitting: Oncology

## 2018-07-01 DIAGNOSIS — D469 Myelodysplastic syndrome, unspecified: Secondary | ICD-10-CM

## 2018-07-01 NOTE — Telephone Encounter (Signed)
Called regarding 10/11 °

## 2018-07-02 ENCOUNTER — Other Ambulatory Visit: Payer: Self-pay | Admitting: *Deleted

## 2018-07-02 ENCOUNTER — Ambulatory Visit (HOSPITAL_COMMUNITY)
Admission: RE | Admit: 2018-07-02 | Discharge: 2018-07-02 | Disposition: A | Discharge status: Home / Self Care | Payer: Medicare Other | Source: Ambulatory Visit | Attending: Oncology | Admitting: Oncology

## 2018-07-02 ENCOUNTER — Inpatient Hospital Stay: Payer: Medicare Other | Admitting: Nurse Practitioner

## 2018-07-02 ENCOUNTER — Inpatient Hospital Stay: Payer: Medicare Other

## 2018-07-02 DIAGNOSIS — D469 Myelodysplastic syndrome, unspecified: Secondary | ICD-10-CM | POA: Insufficient documentation

## 2018-07-02 LAB — PREPARE RBC (CROSSMATCH)

## 2018-07-02 MED ORDER — SODIUM CHLORIDE 0.9% IV SOLUTION: Freq: Once | INTRAVENOUS | Status: DC

## 2018-07-02 MED ORDER — SODIUM CHLORIDE 0.9% FLUSH: 3.0000 mL | INTRAVENOUS | Status: DC | PRN

## 2018-07-02 MED ORDER — DIPHENHYDRAMINE HCL 25 MG PO CAPS
25.0000 mg | ORAL_CAPSULE | Freq: Once | ORAL | Status: AC
  Administered 2018-07-02: 25 mg via ORAL
  Filled 2018-07-02: qty 1

## 2018-07-02 MED ORDER — HEPARIN SOD (PORK) LOCK FLUSH 100 UNIT/ML IV SOLN: 500.0000 [IU] | Freq: Every day | INTRAVENOUS | Status: DC | PRN

## 2018-07-02 MED ORDER — HEPARIN SOD (PORK) LOCK FLUSH 100 UNIT/ML IV SOLN
250.0000 [IU] | INTRAVENOUS | Status: AC | PRN
  Administered 2018-07-02: 250 [IU]
  Filled 2018-07-02: qty 5

## 2018-07-02 MED ORDER — SODIUM CHLORIDE 0.9% FLUSH
10.0000 mL | INTRAVENOUS | Status: AC | PRN
  Administered 2018-07-02: 10 mL

## 2018-07-02 MED ORDER — ACETAMINOPHEN 325 MG PO TABS
650.0000 mg | ORAL_TABLET | Freq: Once | ORAL | Status: AC
  Administered 2018-07-02: 650 mg via ORAL
  Filled 2018-07-02: qty 2

## 2018-07-02 MED ORDER — SODIUM CHLORIDE 0.9% IV SOLUTION: 250.0000 mL | Freq: Once | INTRAVENOUS | Status: DC

## 2018-07-02 NOTE — Progress Notes (Signed)
hol

## 2018-07-03 LAB — TYPE AND SCREEN
ABO/RH(D): O POS
Antibody Screen: NEGATIVE
UNIT DIVISION: 0
Unit division: 0

## 2018-07-03 LAB — BPAM RBC
Blood Product Expiration Date: 201911062359
Blood Product Expiration Date: 201911062359
ISSUE DATE / TIME: 201910091148
ISSUE DATE / TIME: 201910091148
UNIT TYPE AND RH: 5100
Unit Type and Rh: 5100

## 2018-07-04 ENCOUNTER — Inpatient Hospital Stay (HOSPITAL_BASED_OUTPATIENT_CLINIC_OR_DEPARTMENT_OTHER): Payer: Medicare Other | Admitting: Oncology

## 2018-07-04 ENCOUNTER — Other Ambulatory Visit: Payer: Self-pay | Admitting: Emergency Medicine

## 2018-07-04 ENCOUNTER — Encounter: Payer: Self-pay | Admitting: Oncology

## 2018-07-04 ENCOUNTER — Inpatient Hospital Stay: Payer: Medicare Other

## 2018-07-04 ENCOUNTER — Telehealth: Payer: Self-pay

## 2018-07-04 VITALS — BP 95/52 | HR 79 | Temp 98.8°F | Resp 18 | Ht 67.01 in | Wt 144.5 lb

## 2018-07-04 DIAGNOSIS — C9000 Multiple myeloma not having achieved remission: Secondary | ICD-10-CM

## 2018-07-04 DIAGNOSIS — D61818 Other pancytopenia: Secondary | ICD-10-CM

## 2018-07-04 DIAGNOSIS — A419 Sepsis, unspecified organism: Secondary | ICD-10-CM | POA: Diagnosis not present

## 2018-07-04 DIAGNOSIS — Z79899 Other long term (current) drug therapy: Secondary | ICD-10-CM

## 2018-07-04 DIAGNOSIS — D469 Myelodysplastic syndrome, unspecified: Secondary | ICD-10-CM | POA: Diagnosis not present

## 2018-07-04 DIAGNOSIS — R634 Abnormal weight loss: Secondary | ICD-10-CM

## 2018-07-04 DIAGNOSIS — S31000A Unspecified open wound of lower back and pelvis without penetration into retroperitoneum, initial encounter: Secondary | ICD-10-CM | POA: Diagnosis not present

## 2018-07-04 DIAGNOSIS — I959 Hypotension, unspecified: Secondary | ICD-10-CM

## 2018-07-04 DIAGNOSIS — Z452 Encounter for adjustment and management of vascular access device: Secondary | ICD-10-CM

## 2018-07-04 DIAGNOSIS — K626 Ulcer of anus and rectum: Secondary | ICD-10-CM

## 2018-07-04 LAB — CBC WITH DIFFERENTIAL (CANCER CENTER ONLY)
ABS IMMATURE GRANULOCYTES: 0 10*3/uL (ref 0.00–0.07)
BASOS PCT: 2 %
Basophils Absolute: 0 10*3/uL (ref 0.0–0.1)
Eosinophils Absolute: 0 10*3/uL (ref 0.0–0.5)
Eosinophils Relative: 0 %
HCT: 24.3 % — ABNORMAL LOW (ref 39.0–52.0)
Hemoglobin: 8.1 g/dL — ABNORMAL LOW (ref 13.0–17.0)
IMMATURE GRANULOCYTES: 0 %
Lymphocytes Relative: 83 %
Lymphs Abs: 0.3 10*3/uL — ABNORMAL LOW (ref 0.7–4.0)
MCH: 29.8 pg (ref 26.0–34.0)
MCHC: 33.3 g/dL (ref 30.0–36.0)
MCV: 89.3 fL (ref 80.0–100.0)
MONOS PCT: 10 %
Monocytes Absolute: 0 10*3/uL — ABNORMAL LOW (ref 0.1–1.0)
Neutro Abs: 0 10*3/uL — CL (ref 1.7–7.7)
Neutrophils Relative %: 5 %
PLATELETS: 5 10*3/uL — AB (ref 150–400)
RBC: 2.72 MIL/uL — AB (ref 4.22–5.81)
RDW: 12.8 % (ref 11.5–15.5)
WBC Count: 0.4 10*3/uL — CL (ref 4.0–10.5)
nRBC: 0 % (ref 0.0–0.2)

## 2018-07-04 LAB — SAMPLE TO BLOOD BANK

## 2018-07-04 MED ORDER — HEPARIN SOD (PORK) LOCK FLUSH 100 UNIT/ML IV SOLN
500.0000 [IU] | Freq: Once | INTRAVENOUS | Status: AC | PRN
  Administered 2018-07-04: 250 [IU]
  Filled 2018-07-04: qty 5

## 2018-07-04 MED ORDER — SODIUM CHLORIDE 0.9% FLUSH
10.0000 mL | INTRAVENOUS | Status: DC | PRN
  Administered 2018-07-04: 10 mL
  Filled 2018-07-04: qty 10

## 2018-07-04 NOTE — Telephone Encounter (Signed)
Printed avs and calender of upcoming appointment. Per 10/11 los. Was unable to schedule [facility] on the same day as est appointment. Patient was okay with returning on 10/16 for NUT appointment.

## 2018-07-04 NOTE — Progress Notes (Signed)
[facility] OFFICE PROGRESS NOTE   Diagnosis: Myelodysplasia  INTERVAL HISTORY:   [name] returns for a scheduled visit.  He was transfused with packed red blood cells on 07/02/2018.  He last received platelets 06/27/2018. He was evaluated by Dr. Florene Glen on 07/01/2018.  Dr. Florene Glen recommends a restaging bone marrow biopsy prior to the next cycle of 5 azacytidine.  He denies fever.  He has noted bleeding at the left upper gumline today.  No other bleeding.  The rectum feels better.  He continues sitz bath. Andrew [name] is concerned that he is losing weight.  He reports eating 2.5 meals daily.  Objective:  Vital signs in last 24 hours:  Blood pressure (!) 95/52, pulse 79, temperature 98.8 F (37.1 C), temperature source Oral, resp. rate 18, height 5' 7.01" (1.702 m), weight 144 lb 8 oz (65.5 kg), SpO2 100 %.    HEENT: Mild oozing at the left upper anterior gumline, no other bleeding, no thrush Resp: Lungs clear bilaterally Cardio: Regular rate and rhythm GI: No hepatosplenomegaly,nontender Vascular: No leg edema Rectal: Ulcerations surrounding the verge and perineum, no erythema or drainage  Portacath/PICC-without erythema  Lab Results:  Lab Results  Component Value Date   WBC 0.4 (LL) 07/04/2018   HGB 8.1 (L) 07/04/2018   HCT 24.3 (L) 07/04/2018   MCV 89.3 07/04/2018   PLT 5 (LL) 07/04/2018   NEUTROABS PENDING 07/04/2018    CMP  Lab Results  Component Value Date   NA 132 (L) 06/16/2018   K 4.4 06/16/2018   CL 98 06/16/2018   CO2 24 06/16/2018   GLUCOSE 104 (H) 06/16/2018   BUN 40 (H) 06/16/2018   CREATININE 1.42 (H) 06/16/2018   CALCIUM 9.0 06/16/2018   PROT 8.1 06/16/2018   ALBUMIN 2.2 (L) 06/16/2018   AST 16 06/16/2018   ALT 22 06/16/2018   ALKPHOS 142 (H) 06/16/2018   BILITOT 0.5 06/16/2018   GFRNONAA 50 (L) 06/16/2018   GFRAA 58 (L) 06/16/2018    Medications: I have reviewed the patient's current  medications.   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  Cycle 1 5-azacytidine 05/06/2018  Cycle25-azacytidine 06/16/2018 2.Renal insufficiency 3.Diabetes 4.Hemoccult positive stool  Upper endoscopy 04/03/2018-chronic gastritis, duodenitis  Colonoscopy 04/04/2018-diverticulosis in the sigmoid colon 5.Right lower lobe 7 mm subpleural nodule on chest CT 04/02/2018 6.Admission with febrile neutropenia 05/20/2018- placed on cefepime, antibiotics changed to vancomycin/meropenem on 05/21/2018, anidulafungin added 9/1/2019antibiotics stopped 05/31/2018, discharge 06/03/2018 with prophylactic acyclovir, Bactrim, and Diflucan. Bactrim discontinued and Levaquin started 06/05/2018 7. 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 Febrile reaction following a platelet transfusion 9/5/2019and 06/05/2018 11.  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  Weight loss      Disposition: Andrew [name] has persistent severe pancytopenia.  He continues to receive red cell and platelet transfusion support.  We will arrange for a crossmatched platelet transfusion tomorrow.  He will call for increased bleeding or a fever.  Andrew [name] will continue sitz baths for the perianal ulcers.  He continues antibiotic prophylaxis.  He will be scheduled for a restaging bone marrow biopsy within the next 1 week.  He is losing weight.  We will arrange for an appointment  with the Cancer center  nutritionist.  The weight loss may be related to myelodysplasia.  No symptoms to suggest a malignancy.  His blood pressure is low.  He is scheduled to see Dr. Marlou Sa next week.  He may not require multiple antihypertensives with the weight loss.  I asked him to hold amlodipine and benazepril until he sees Dr. Marlou Sa.  Andrew [name] will be scheduled for an office visit 07/08/2018.  He is scheduled to see Dr. Marlou Sa on 07/07/2018.  Betsy Coder, MD  07/04/2018  8:37 AM

## 2018-07-04 NOTE — Addendum Note (Signed)
Addended by: Hughie Closs on: 07/04/2018 11:40 AM   Modules accepted: Orders, SmartSet

## 2018-07-05 ENCOUNTER — Inpatient Hospital Stay: Payer: Medicare Other

## 2018-07-05 DIAGNOSIS — D469 Myelodysplastic syndrome, unspecified: Secondary | ICD-10-CM

## 2018-07-05 DIAGNOSIS — Z452 Encounter for adjustment and management of vascular access device: Secondary | ICD-10-CM

## 2018-07-05 DIAGNOSIS — D61818 Other pancytopenia: Secondary | ICD-10-CM

## 2018-07-05 MED ORDER — SODIUM CHLORIDE 0.9% IV SOLUTION
250.0000 mL | Freq: Once | INTRAVENOUS | Status: AC
  Administered 2018-07-05: 250 mL via INTRAVENOUS
  Filled 2018-07-05: qty 250

## 2018-07-05 MED ORDER — DIPHENHYDRAMINE HCL 25 MG PO CAPS
25.0000 mg | ORAL_CAPSULE | Freq: Once | ORAL | Status: AC
  Administered 2018-07-05: 25 mg via ORAL

## 2018-07-05 MED ORDER — ACETAMINOPHEN 325 MG PO TABS
650.0000 mg | ORAL_TABLET | Freq: Once | ORAL | Status: AC
  Administered 2018-07-05: 650 mg via ORAL

## 2018-07-05 MED ORDER — ACETAMINOPHEN 325 MG PO TABS
ORAL_TABLET | ORAL | Status: AC
  Filled 2018-07-05: qty 2

## 2018-07-05 MED ORDER — DIPHENHYDRAMINE HCL 25 MG PO CAPS
ORAL_CAPSULE | ORAL | Status: AC
  Filled 2018-07-05: qty 1

## 2018-07-05 MED ORDER — HEPARIN SOD (PORK) LOCK FLUSH 100 UNIT/ML IV SOLN
250.0000 [IU] | INTRAVENOUS | Status: DC | PRN
  Filled 2018-07-05: qty 5

## 2018-07-05 MED ORDER — HEPARIN SOD (PORK) LOCK FLUSH 100 UNIT/ML IV SOLN
250.0000 [IU] | INTRAVENOUS | Status: AC | PRN
  Administered 2018-07-05: 250 [IU]
  Filled 2018-07-05: qty 5

## 2018-07-05 MED ORDER — SODIUM CHLORIDE 0.9% FLUSH
3.0000 mL | INTRAVENOUS | Status: AC | PRN
  Administered 2018-07-05: 3 mL
  Filled 2018-07-05: qty 10

## 2018-07-05 MED ORDER — SODIUM CHLORIDE 0.9% FLUSH
10.0000 mL | INTRAVENOUS | Status: DC | PRN
  Filled 2018-07-05: qty 10

## 2018-07-05 NOTE — Progress Notes (Signed)
Pt tolerated Platelet transfusion well, No fever present, Temp 98.7 after transfusion completion. No further problems or concerns noted.

## 2018-07-05 NOTE — Progress Notes (Signed)
MD on call Maylon Peppers) aware of fever and need for premeds for blood transfusion aside from the fever. HE gave verbal orders and is aware patient reports feeling well other than fever. We are to proceed with transfusing the 1 unit of platelets if temp is 100.4 degrees or lower.

## 2018-07-05 NOTE — Patient Instructions (Addendum)
Platelet Transfusion, Care After °Refer to this sheet in the next few weeks. These instructions provide you with information about caring for yourself after your procedure. Your health care provider may also give you more specific instructions. Your treatment has been planned according to current medical practices, but problems sometimes occur. Call your health care provider if you have any problems or questions after your procedure. °What can I expect after the procedure? °After the procedure, it is common to have: °· Bruising and soreness at the IV site. °· Fever or chills within the first 48 hours of your transfusion. ° °Follow these instructions at home: °· Take medicines only as directed by your health care provider. Ask your health care provider if you can take an over-the-counter pain reliever in case you have a fever or headache a day or two after your transfusion. °· Return to your normal activities as directed by your health care provider. °Contact a health care provider if: °· You have a fever. °· You have a headache. °· You have redness, swelling, or pain at your IV site. °· You have skin itching or a rash. °· You vomit. °· You feel unusually tired or weak. °Get help right away if: °· You have trouble breathing. °· You have a decreased amount of urine or you urinate less often than you normally do. °· Your urine is darker than normal. °· You have pain in your back, abdomen, or chest. °· You have cool, clammy skin. °· You have a rapid heartbeat. °This information is not intended to replace advice given to you by your health care provider. Make sure you discuss any questions you have with your health care provider. °Document Released: 10/01/2014 Document Revised: 02/16/2016 Document Reviewed: 07/21/2014 °Elsevier Interactive Patient Education © 2018 Elsevier Inc. ° °

## 2018-07-07 ENCOUNTER — Emergency Department (HOSPITAL_COMMUNITY): Payer: Medicare Other

## 2018-07-07 ENCOUNTER — Inpatient Hospital Stay (HOSPITAL_COMMUNITY): Payer: Medicare Other

## 2018-07-07 ENCOUNTER — Inpatient Hospital Stay (HOSPITAL_COMMUNITY)
Admission: EM | Admit: 2018-07-07 | Discharge: 2018-07-11 | DRG: 872 | Disposition: A | Discharge status: Home Health | Payer: Medicare Other | Attending: Internal Medicine | Admitting: Internal Medicine

## 2018-07-07 ENCOUNTER — Encounter (HOSPITAL_COMMUNITY): Payer: Self-pay

## 2018-07-07 ENCOUNTER — Other Ambulatory Visit: Payer: Self-pay

## 2018-07-07 DIAGNOSIS — R509 Fever, unspecified: Secondary | ICD-10-CM

## 2018-07-07 DIAGNOSIS — E1129 Type 2 diabetes mellitus with other diabetic kidney complication: Secondary | ICD-10-CM | POA: Diagnosis not present

## 2018-07-07 DIAGNOSIS — D61818 Other pancytopenia: Secondary | ICD-10-CM | POA: Diagnosis present

## 2018-07-07 DIAGNOSIS — A419 Sepsis, unspecified organism: Secondary | ICD-10-CM | POA: Diagnosis present

## 2018-07-07 DIAGNOSIS — J449 Chronic obstructive pulmonary disease, unspecified: Secondary | ICD-10-CM | POA: Diagnosis present

## 2018-07-07 DIAGNOSIS — E119 Type 2 diabetes mellitus without complications: Secondary | ICD-10-CM | POA: Diagnosis present

## 2018-07-07 DIAGNOSIS — S31000A Unspecified open wound of lower back and pelvis without penetration into retroperitoneum, initial encounter: Secondary | ICD-10-CM | POA: Diagnosis present

## 2018-07-07 DIAGNOSIS — D7589 Other specified diseases of blood and blood-forming organs: Secondary | ICD-10-CM | POA: Diagnosis present

## 2018-07-07 DIAGNOSIS — Z87891 Personal history of nicotine dependence: Secondary | ICD-10-CM | POA: Diagnosis not present

## 2018-07-07 DIAGNOSIS — E440 Moderate protein-calorie malnutrition: Secondary | ICD-10-CM | POA: Diagnosis present

## 2018-07-07 DIAGNOSIS — K219 Gastro-esophageal reflux disease without esophagitis: Secondary | ICD-10-CM | POA: Diagnosis present

## 2018-07-07 DIAGNOSIS — K921 Melena: Secondary | ICD-10-CM | POA: Diagnosis not present

## 2018-07-07 DIAGNOSIS — R6510 Systemic inflammatory response syndrome (SIRS) of non-infectious origin without acute organ dysfunction: Secondary | ICD-10-CM

## 2018-07-07 DIAGNOSIS — J410 Simple chronic bronchitis: Secondary | ICD-10-CM | POA: Diagnosis not present

## 2018-07-07 DIAGNOSIS — I10 Essential (primary) hypertension: Secondary | ICD-10-CM | POA: Diagnosis present

## 2018-07-07 DIAGNOSIS — Z23 Encounter for immunization: Secondary | ICD-10-CM | POA: Diagnosis present

## 2018-07-07 DIAGNOSIS — D469 Myelodysplastic syndrome, unspecified: Secondary | ICD-10-CM | POA: Diagnosis present

## 2018-07-07 DIAGNOSIS — D709 Neutropenia, unspecified: Secondary | ICD-10-CM | POA: Diagnosis present

## 2018-07-07 DIAGNOSIS — Z79899 Other long term (current) drug therapy: Secondary | ICD-10-CM

## 2018-07-07 DIAGNOSIS — L8990 Pressure ulcer of unspecified site, unspecified stage: Secondary | ICD-10-CM

## 2018-07-07 DIAGNOSIS — D649 Anemia, unspecified: Secondary | ICD-10-CM

## 2018-07-07 DIAGNOSIS — R5081 Fever presenting with conditions classified elsewhere: Secondary | ICD-10-CM | POA: Diagnosis not present

## 2018-07-07 DIAGNOSIS — K611 Rectal abscess: Secondary | ICD-10-CM | POA: Diagnosis present

## 2018-07-07 DIAGNOSIS — H9190 Unspecified hearing loss, unspecified ear: Secondary | ICD-10-CM | POA: Diagnosis present

## 2018-07-07 DIAGNOSIS — L98429 Non-pressure chronic ulcer of back with unspecified severity: Secondary | ICD-10-CM | POA: Diagnosis present

## 2018-07-07 DIAGNOSIS — K056 Periodontal disease, unspecified: Secondary | ICD-10-CM | POA: Diagnosis present

## 2018-07-07 DIAGNOSIS — N289 Disorder of kidney and ureter, unspecified: Secondary | ICD-10-CM | POA: Diagnosis not present

## 2018-07-07 DIAGNOSIS — Z91013 Allergy to seafood: Secondary | ICD-10-CM

## 2018-07-07 DIAGNOSIS — K626 Ulcer of anus and rectum: Secondary | ICD-10-CM | POA: Diagnosis present

## 2018-07-07 DIAGNOSIS — D508 Other iron deficiency anemias: Secondary | ICD-10-CM | POA: Diagnosis not present

## 2018-07-07 LAB — CBC WITH DIFFERENTIAL/PLATELET
ABS IMMATURE GRANULOCYTES: 0 10*3/uL (ref 0.00–0.07)
BASOS ABS: 0 10*3/uL (ref 0.0–0.1)
BASOS PCT: 3 %
Eosinophils Absolute: 0 10*3/uL (ref 0.0–0.5)
Eosinophils Relative: 3 %
HEMATOCRIT: 22.8 % — AB (ref 39.0–52.0)
HEMOGLOBIN: 7.4 g/dL — AB (ref 13.0–17.0)
IMMATURE GRANULOCYTES: 0 %
LYMPHS ABS: 0.3 10*3/uL — AB (ref 0.7–4.0)
Lymphocytes Relative: 78 %
MCH: 29 pg (ref 26.0–34.0)
MCHC: 32.5 g/dL (ref 30.0–36.0)
MCV: 89.4 fL (ref 80.0–100.0)
MONOS PCT: 8 %
Monocytes Absolute: 0 10*3/uL — ABNORMAL LOW (ref 0.1–1.0)
NEUTROS ABS: 0 10*3/uL — AB (ref 1.7–7.7)
NEUTROS PCT: 8 %
NRBC: 0 % (ref 0.0–0.2)
Platelets: 16 10*3/uL — CL (ref 150–400)
RBC: 2.55 MIL/uL — ABNORMAL LOW (ref 4.22–5.81)
RDW: 12.1 % (ref 11.5–15.5)
WBC: 0.4 10*3/uL — CL (ref 4.0–10.5)

## 2018-07-07 LAB — URINALYSIS, ROUTINE W REFLEX MICROSCOPIC
Bilirubin Urine: NEGATIVE
GLUCOSE, UA: NEGATIVE mg/dL
HGB URINE DIPSTICK: NEGATIVE
Ketones, ur: NEGATIVE mg/dL
LEUKOCYTES UA: NEGATIVE
NITRITE: NEGATIVE
PROTEIN: 30 mg/dL — AB
Specific Gravity, Urine: 1.019 (ref 1.005–1.030)
pH: 5 (ref 5.0–8.0)

## 2018-07-07 LAB — PREPARE PLATELET PHERESIS
UNIT DIVISION: 0
Unit division: 0

## 2018-07-07 LAB — COMPREHENSIVE METABOLIC PANEL
ALT: 31 U/L (ref 0–44)
ANION GAP: 11 (ref 5–15)
AST: 27 U/L (ref 15–41)
Albumin: 2.3 g/dL — ABNORMAL LOW (ref 3.5–5.0)
Alkaline Phosphatase: 161 U/L — ABNORMAL HIGH (ref 38–126)
BILIRUBIN TOTAL: 0.7 mg/dL (ref 0.3–1.2)
BUN: 34 mg/dL — ABNORMAL HIGH (ref 8–23)
CHLORIDE: 98 mmol/L (ref 98–111)
CO2: 22 mmol/L (ref 22–32)
Calcium: 8.8 mg/dL — ABNORMAL LOW (ref 8.9–10.3)
Creatinine, Ser: 1.58 mg/dL — ABNORMAL HIGH (ref 0.61–1.24)
GFR calc Af Amer: 51 mL/min — ABNORMAL LOW (ref >60)
GFR, EST NON AFRICAN AMERICAN: 44 mL/min — AB (ref >60)
Glucose, Bld: 153 mg/dL — ABNORMAL HIGH (ref 70–99)
POTASSIUM: 3.8 mmol/L (ref 3.5–5.1)
Sodium: 131 mmol/L — ABNORMAL LOW (ref 135–145)
Total Protein: 8 g/dL (ref 6.5–8.1)

## 2018-07-07 LAB — BPAM PLATELET PHERESIS
BLOOD PRODUCT EXPIRATION DATE: 201910132359
Blood Product Expiration Date: 201910162359
ISSUE DATE / TIME: 201910121019
ISSUE DATE / TIME: 201910141717
UNIT TYPE AND RH: 5100
Unit Type and Rh: 5100

## 2018-07-07 LAB — PREPARE RBC (CROSSMATCH)

## 2018-07-07 LAB — TROPONIN I: Troponin I: <0.03 ng/mL (ref <0.03)

## 2018-07-07 LAB — LACTIC ACID, PLASMA: LACTIC ACID, VENOUS: 1 mmol/L (ref 0.5–1.9)

## 2018-07-07 LAB — GLUCOSE, CAPILLARY: Glucose-Capillary: 117 mg/dL — ABNORMAL HIGH (ref 70–99)

## 2018-07-07 LAB — I-STAT CG4 LACTIC ACID, ED: LACTIC ACID, VENOUS: 1 mmol/L (ref 0.5–1.9)

## 2018-07-07 MED ORDER — TRAMADOL HCL 50 MG PO TABS
50.0000 mg | ORAL_TABLET | Freq: Four times a day (QID) | ORAL | Status: DC | PRN
  Administered 2018-07-08 – 2018-07-11 (×6): 50 mg via ORAL
  Filled 2018-07-07 (×6): qty 1

## 2018-07-07 MED ORDER — SODIUM CHLORIDE 0.9 % IV SOLN
2.0000 g | Freq: Once | INTRAVENOUS | Status: AC
  Administered 2018-07-07: 2 g via INTRAVENOUS
  Filled 2018-07-07: qty 2

## 2018-07-07 MED ORDER — PANTOPRAZOLE SODIUM 40 MG PO TBEC
40.0000 mg | DELAYED_RELEASE_TABLET | Freq: Two times a day (BID) | ORAL | Status: DC
  Administered 2018-07-07 – 2018-07-11 (×8): 40 mg via ORAL
  Filled 2018-07-07 (×8): qty 1

## 2018-07-07 MED ORDER — INSULIN ASPART 100 UNIT/ML ~~LOC~~ SOLN
0.0000 [IU] | Freq: Three times a day (TID) | SUBCUTANEOUS | Status: DC
  Administered 2018-07-08: 2 [IU] via SUBCUTANEOUS
  Administered 2018-07-09 – 2018-07-10 (×3): 1 [IU] via SUBCUTANEOUS

## 2018-07-07 MED ORDER — CHLORHEXIDINE GLUCONATE 0.12 % MT SOLN
15.0000 mL | Freq: Two times a day (BID) | OROMUCOSAL | Status: DC
  Administered 2018-07-07 – 2018-07-10 (×7): 15 mL via OROMUCOSAL
  Filled 2018-07-07 (×8): qty 15

## 2018-07-07 MED ORDER — ONDANSETRON HCL 4 MG/2ML IJ SOLN: 4.0000 mg | Freq: Four times a day (QID) | INTRAMUSCULAR | Status: DC | PRN

## 2018-07-07 MED ORDER — INSULIN ASPART 100 UNIT/ML ~~LOC~~ SOLN
0.0000 [IU] | Freq: Every day | SUBCUTANEOUS | Status: DC
  Administered 2018-07-09: 2 [IU] via SUBCUTANEOUS

## 2018-07-07 MED ORDER — SODIUM CHLORIDE 0.9% FLUSH: 10.0000 mL | INTRAVENOUS | Status: DC | PRN

## 2018-07-07 MED ORDER — SODIUM CHLORIDE 0.9% IV SOLUTION
Freq: Once | INTRAVENOUS | Status: AC
  Administered 2018-07-07: 23:00:00 via INTRAVENOUS

## 2018-07-07 MED ORDER — SODIUM CHLORIDE 0.9 % IV BOLUS (SEPSIS): 250.0000 mL | Freq: Once | INTRAVENOUS | Status: DC

## 2018-07-07 MED ORDER — PIPERACILLIN-TAZOBACTAM 3.375 G IVPB
3.3750 g | Freq: Three times a day (TID) | INTRAVENOUS | Status: DC
  Administered 2018-07-08 – 2018-07-09 (×4): 3.375 g via INTRAVENOUS
  Filled 2018-07-07 (×3): qty 50

## 2018-07-07 MED ORDER — IBUPROFEN 400 MG PO TABS
600.0000 mg | ORAL_TABLET | Freq: Once | ORAL | Status: AC
  Administered 2018-07-07: 600 mg via ORAL
  Filled 2018-07-07: qty 1

## 2018-07-07 MED ORDER — LATANOPROST 0.005 % OP SOLN
1.0000 [drp] | Freq: Every day | OPHTHALMIC | Status: DC
  Administered 2018-07-07 – 2018-07-10 (×4): 1 [drp] via OPHTHALMIC
  Filled 2018-07-07: qty 2.5

## 2018-07-07 MED ORDER — IPRATROPIUM-ALBUTEROL 0.5-2.5 (3) MG/3ML IN SOLN
3.0000 mL | Freq: Four times a day (QID) | RESPIRATORY_TRACT | Status: DC
  Administered 2018-07-07: 3 mL via RESPIRATORY_TRACT
  Filled 2018-07-07: qty 3

## 2018-07-07 MED ORDER — GEMFIBROZIL 600 MG PO TABS
600.0000 mg | ORAL_TABLET | Freq: Two times a day (BID) | ORAL | Status: DC
  Administered 2018-07-07 – 2018-07-10 (×6): 600 mg via ORAL
  Filled 2018-07-07 (×10): qty 1

## 2018-07-07 MED ORDER — IOHEXOL 300 MG/ML  SOLN
100.0000 mL | Freq: Once | INTRAMUSCULAR | Status: AC | PRN
  Administered 2018-07-07: 100 mL via INTRAVENOUS

## 2018-07-07 MED ORDER — VANCOMYCIN HCL 500 MG IV SOLR
500.0000 mg | Freq: Two times a day (BID) | INTRAVENOUS | Status: DC
  Administered 2018-07-08 (×2): 500 mg via INTRAVENOUS
  Filled 2018-07-07 (×3): qty 500

## 2018-07-07 MED ORDER — PIPERACILLIN-TAZOBACTAM 3.375 G IVPB 30 MIN
3.3750 g | Freq: Once | INTRAVENOUS | Status: AC
  Administered 2018-07-08: 3.375 g via INTRAVENOUS
  Filled 2018-07-07 (×2): qty 50

## 2018-07-07 MED ORDER — SODIUM CHLORIDE 0.9 % IV BOLUS (SEPSIS)
1000.0000 mL | Freq: Once | INTRAVENOUS | Status: AC
  Administered 2018-07-07: 1000 mL via INTRAVENOUS

## 2018-07-07 MED ORDER — IPRATROPIUM-ALBUTEROL 0.5-2.5 (3) MG/3ML IN SOLN
3.0000 mL | Freq: Two times a day (BID) | RESPIRATORY_TRACT | Status: DC
  Administered 2018-07-08 (×2): 3 mL via RESPIRATORY_TRACT
  Filled 2018-07-07 (×3): qty 3

## 2018-07-07 MED ORDER — PNEUMOCOCCAL VAC POLYVALENT 25 MCG/0.5ML IJ INJ
0.5000 mL | INJECTION | INTRAMUSCULAR | Status: AC
  Administered 2018-07-11: 0.5 mL via INTRAMUSCULAR
  Filled 2018-07-07 (×2): qty 0.5

## 2018-07-07 MED ORDER — VANCOMYCIN HCL 10 G IV SOLR
1500.0000 mg | Freq: Once | INTRAVENOUS | Status: AC
  Administered 2018-07-07: 1500 mg via INTRAVENOUS
  Filled 2018-07-07: qty 1500

## 2018-07-07 MED ORDER — ENSURE ENLIVE PO LIQD
237.0000 mL | Freq: Two times a day (BID) | ORAL | Status: DC
  Administered 2018-07-08: 237 mL via ORAL

## 2018-07-07 MED ORDER — DORZOLAMIDE HCL-TIMOLOL MAL 2-0.5 % OP SOLN
1.0000 [drp] | Freq: Two times a day (BID) | OPHTHALMIC | Status: DC
  Administered 2018-07-07 – 2018-07-10 (×7): 1 [drp] via OPHTHALMIC
  Filled 2018-07-07: qty 10

## 2018-07-07 MED ORDER — FLUCONAZOLE 50 MG PO TABS: 50.0000 mg | ORAL_TABLET | Freq: Every day | ORAL | Status: DC

## 2018-07-07 MED ORDER — METRONIDAZOLE IN NACL 5-0.79 MG/ML-% IV SOLN: 500.0000 mg | Freq: Three times a day (TID) | INTRAVENOUS | Status: DC

## 2018-07-07 MED ORDER — INFLUENZA VAC SPLIT HIGH-DOSE 0.5 ML IM SUSY
0.5000 mL | PREFILLED_SYRINGE | INTRAMUSCULAR | Status: AC
  Administered 2018-07-11: 0.5 mL via INTRAMUSCULAR
  Filled 2018-07-07 (×2): qty 0.5

## 2018-07-07 MED ORDER — ONDANSETRON HCL 4 MG PO TABS: 4.0000 mg | ORAL_TABLET | Freq: Four times a day (QID) | ORAL | Status: DC | PRN

## 2018-07-07 MED ORDER — METOPROLOL TARTRATE 25 MG PO TABS
25.0000 mg | ORAL_TABLET | Freq: Two times a day (BID) | ORAL | Status: DC
  Administered 2018-07-07 – 2018-07-11 (×7): 25 mg via ORAL
  Filled 2018-07-07 (×8): qty 1

## 2018-07-07 MED ORDER — METRONIDAZOLE IN NACL 5-0.79 MG/ML-% IV SOLN
500.0000 mg | Freq: Three times a day (TID) | INTRAVENOUS | Status: DC
  Administered 2018-07-07: 500 mg via INTRAVENOUS
  Filled 2018-07-07: qty 100

## 2018-07-07 MED ORDER — TRAMADOL HCL 50 MG PO TABS
50.0000 mg | ORAL_TABLET | Freq: Once | ORAL | Status: AC
  Administered 2018-07-07: 50 mg via ORAL
  Filled 2018-07-07: qty 1

## 2018-07-07 MED ORDER — MORPHINE SULFATE (PF) 2 MG/ML IV SOLN: 1.0000 mg | INTRAVENOUS | Status: DC | PRN

## 2018-07-07 MED ORDER — SODIUM CHLORIDE 0.9% IV SOLUTION: Freq: Once | INTRAVENOUS | Status: DC

## 2018-07-07 MED ORDER — ACYCLOVIR 400 MG PO TABS
400.0000 mg | ORAL_TABLET | Freq: Two times a day (BID) | ORAL | Status: DC
  Administered 2018-07-07 – 2018-07-11 (×8): 400 mg via ORAL
  Filled 2018-07-07 (×9): qty 1

## 2018-07-07 NOTE — Progress Notes (Addendum)
Pharmacy Antibiotic Note  Andrew [name] is a 67 y.o. male admitted on 07/07/2018 with sepsis of unknown source.  Pharmacy has been consulted for vancomycin/Zosyn dosing. Pt has h/o cancer w/ active treatment plan. WBC - 0.4, Tmax - 103; ANC - 0.0; lactic acid - 1.0; Scr- 1.58, with ranges of 0.5-2.3 over recent few months.  Plan: Vancomycin 1500mg  x1, then Vancomycin 500mg  q12h Zosyn 3.375g q8h Monitor and adjust abx therapy pending C&S, vanc trough as necessary Pt received one dose of Cefepime 2g and one dose of Flagyl 500 mg in ED  Weight: 149 lb (67.6 kg)  Temp (24hrs), Avg:103 F (39.4 C), Min:103 F (39.4 C), Max:103 F (39.4 C)  Recent Labs  Lab 07/04/18 0812  WBC 0.4*    CrCl cannot be calculated (Patient's most recent lab result is older than the maximum 21 days allowed.).    Allergies  Allergen Reactions  . Shellfish Allergy     " I BREAK OUT IN HIVES "    Antimicrobials this admission: Zosyn 10/15 >> Vancomycin 10/14 >>  Cefepime 10/14 x1 Flagyl 10/14 x1   Dose adjustments this admission: N/A  Microbiology results: Pending  Thank you for allowing pharmacy to be a part of this patient's care.  Tyson Babinski 07/07/2018 2:43 PM

## 2018-07-07 NOTE — ED Notes (Signed)
Blood consent signed by pt

## 2018-07-07 NOTE — ED Triage Notes (Addendum)
To ED via [facility] - fell in a parking lot while at [place] - called in as syncopal episode- pt alert/oriented x 4, skin is hot to touch- pt is shivering-- hx of Cancer- has a PICC line in right upper arm-- PA informed of pt;'s arrival- at bedside

## 2018-07-07 NOTE — ED Notes (Signed)
Updated report called to floor

## 2018-07-07 NOTE — ED Provider Notes (Addendum)
[facility] Provider Note   CSN: [id number] Arrival date & time: 07/07/18  1414     History   Chief Complaint Chief Complaint  Patient presents with  . Code Sepsis    HPI Andrew [name] is a 67 y.o. male.  HPI   Andrew [name] is a 67 y.o. male, with a history of COPD, DM, HTN, anemia, myelodysplasia, presenting to the ED with an episode of weakness that occurred shortly prior to arrival.  Patient states he was standing at the ATM when he felt weak overall and sat down on the ground. He has felt weak the last couple days. Patient did not know he had a fever until he arrived in the ED. Denies chest pain, shortness of breath, cough, N/V/D, rash, neck pain/stiffness, abdominal pain, urinary symptoms, or any other complaints.     Past Medical History:  Diagnosis Date  . Abdominal bloating    probable lactose intolorence  . Anemia 04/03/2018  . Asthma   . COPD (chronic obstructive pulmonary disease) (HCC)    mild improvement  . DM (diabetes mellitus) ([place])   . Hearing impairment    asymptomatic  . HTN (hypertension)   . Tobacco use   . Vitamin D deficiency     Patient Active Problem List   Diagnosis Date Noted  . PICC (peripherally inserted central catheter) flush 06/13/2018  . Febrile neutropenia ([place])   . Renal insufficiency 05/24/2018  . Atherosclerosis of aorta ([place]) 05/24/2018  . Poor dentition 05/24/2018  . Neutropenic fever ([place]) 05/20/2018  . Myelodysplasia (myelodysplastic syndrome) ([place]) 05/06/2018  . Goals of care, counseling/discussion 05/06/2018  . Pancytopenia ([place]) 04/02/2018  . Allergic rhinitis 10/24/2012  . HTN (hypertension)   . COPD (chronic obstructive pulmonary disease) ([place])   . Asthma   . DM (diabetes mellitus) ([place])   . Tobacco use     Past Surgical History:  Procedure Laterality Date  . BIOPSY  04/03/2018   Procedure: BIOPSY;  Surgeon: Otis Brace, MD;  Location: [facility];  Service:  Gastroenterology;;  . COLONOSCOPY WITH PROPOFOL N/A 04/04/2018   Procedure: COLONOSCOPY WITH PROPOFOL;  Surgeon: Otis Brace, MD;  Location: [facility];  Service: Gastroenterology;  Laterality: N/A;  . ESOPHAGOGASTRODUODENOSCOPY (EGD) WITH PROPOFOL N/A 04/03/2018   Procedure: ESOPHAGOGASTRODUODENOSCOPY (EGD) WITH PROPOFOL;  Surgeon: Otis Brace, MD;  Location: [facility];  Service: Gastroenterology;  Laterality: N/A;  . EYE SURGERY          Home Medications    Prior to Admission medications   Medication Sig Start Date End Date Taking? Authorizing Provider  acyclovir (ZOVIRAX) 400 MG tablet Take 1 tablet (400 mg total) by mouth 2 (two) times daily. 06/03/18  Yes [place] Shark, NP  benazepril (LOTENSIN) 40 MG tablet Take 40 mg by mouth daily. 03/11/18  Yes [provider]  chlorhexidine (PERIDEX) 0.12 % solution Use as directed 15 mLs in the mouth or throat 2 (two) times daily. 06/03/18  Yes [place] Shark, NP  dorzolamide-timolol (COSOPT) 22.3-6.8 MG/ML ophthalmic solution Place 1 drop into both eyes 2 (two) times daily. 02/22/18  Yes [provider]  fluconazole (DIFLUCAN) 50 MG tablet Take 1 tablet (50 mg total) by mouth daily. 06/04/18  Yes [place] Shark, NP  gemfibrozil (LOPID) 600 MG tablet Take 600 mg by mouth 2 (two) times daily. 06/12/18  Yes [provider]  levofloxacin (LEVAQUIN) 500 MG tablet Take 1 tablet (500 mg total) by mouth daily. 06/24/18  Yes  Ladell Pier, MD  metoprolol tartrate (LOPRESSOR) 25 MG tablet Take 25 mg by mouth 2 (two) times daily.    Yes [provider]  montelukast (SINGULAIR) 10 MG tablet Take 10 mg by mouth daily.  05/10/18  Yes [provider]  prochlorperazine (COMPAZINE) 5 MG tablet TAKE 1 TABLET(5 MG) BY MOUTH EVERY 6 HOURS AS NEEDED FOR NAUSEA OR VOMITING Patient taking differently: Take 5 mg by mouth every 6 (six) hours as needed for nausea or vomiting.  05/27/18  Yes [place] Shark, NP  traMADol  (ULTRAM) 50 MG tablet Take 1 tablet (50 mg total) by mouth every 6 (six) hours as needed. 06/25/18  Yes Ladell Pier, MD  TRAVATAN Z 0.004 % SOLN ophthalmic solution Place 1 drop into both eyes every evening. 02/22/18  Yes [provider]  VENTOLIN HFA 108 (90 Base) MCG/ACT inhaler Inhale 1-2 puffs into the lungs 4 (four) times daily as needed. 02/15/18  Yes [provider]  pantoprazole (PROTONIX) 40 MG tablet Take 1 tablet (40 mg total) by mouth 2 (two) times daily. Patient not taking: Reported on 07/07/2018 04/04/18   Hosie Poisson, MD  sorbitol 70 % solution Take 15 mLs by mouth daily as needed. 06/17/18   Ladell Pier, MD  tetrahydrozoline-zinc (VISINE-AC) 0.05-0.25 % ophthalmic solution 2 drops as needed.    [provider]  triamterene-hydrochlorothiazide (MAXZIDE-25) 37.5-25 MG per tablet Take 1 tablet by mouth daily.    [provider]    Family History No family history on file.  Social History Social History   Tobacco Use  . Smoking status: Former Smoker    Packs/day: 0.25    Types: Cigarettes    Last attempt to quit: 03/02/2018    Years since quitting: 0.3  . Smokeless tobacco: Never Used  Substance Use Topics  . Alcohol use: Yes    Frequency: Never    Comment: occ  . Drug use: Never     Allergies   Shellfish allergy   Review of Systems Review of Systems  Constitutional: Negative for chills and fever.  Respiratory: Negative for cough and shortness of breath.   Cardiovascular: Negative for chest pain, palpitations and leg swelling.  Gastrointestinal: Negative for abdominal pain, diarrhea, nausea and vomiting.  Neurological: Positive for weakness (generalized). Negative for syncope.  All other systems reviewed and are negative.    Physical Exam Updated Vital Signs BP (!) 106/56 (BP Location: Left Arm)   Pulse (!) 128   Temp (S) (!) 103 F (39.4 C) (Oral)   Resp 18   Wt 67.6 kg   SpO2 100%   BMI 23.33 kg/m    Physical Exam  Constitutional: He is oriented to person, place, and time. He appears well-developed and well-nourished. No distress.  HENT:  Head: Normocephalic and atraumatic.  Eyes: Pupils are equal, round, and reactive to light. Conjunctivae and EOM are normal.  Neck: Neck supple.  Cardiovascular: Regular rhythm, normal heart sounds and intact distal pulses. Tachycardia present.  Pulmonary/Chest: Effort normal. No respiratory distress. He has wheezes.  Abdominal: Soft. There is no tenderness. There is no guarding.  Musculoskeletal: He exhibits no edema.  PICC line present in the right upper arm.  No noted surrounding tenderness, erythema, swelling, or increased warmth.  Lymphadenopathy:    He has no cervical adenopathy.  Neurological: He is alert and oriented to person, place, and time.  Sensation grossly intact to light touch in the extremities. Strength 5/5 in all extremities. No gait  disturbance. Coordination intact. Cranial nerves III-XII grossly intact. No facial droop.   Skin: Skin is warm and dry. He is not diaphoretic.  3 sacral ulcers, as shown in the picture.  Surrounding erythema and tenderness.  Psychiatric: He has a normal mood and affect. His behavior is normal.  Nursing note and vitals reviewed.      ED Treatments / Results  Labs (all labs ordered are listed, but only abnormal results are displayed) Labs Reviewed  COMPREHENSIVE METABOLIC PANEL - Abnormal; Notable for the following components:      Result Value   Sodium 131 (*)    Glucose, Bld 153 (*)    BUN 34 (*)    Creatinine, Ser 1.58 (*)    Calcium 8.8 (*)    Albumin 2.3 (*)    Alkaline Phosphatase 161 (*)    GFR calc non Af Amer 44 (*)    GFR calc Af Amer 51 (*)    All other components within normal limits  CBC WITH DIFFERENTIAL/PLATELET - Abnormal; Notable for the following components:   WBC 0.4 (*)    RBC 2.55 (*)    Hemoglobin 7.4 (*)    HCT 22.8 (*)    Platelets 16 (*)    Neutro Abs 0.0 (*)     Lymphs Abs 0.3 (*)    Monocytes Absolute 0.0 (*)    All other components within normal limits  URINALYSIS, ROUTINE W REFLEX MICROSCOPIC - Abnormal; Notable for the following components:   Color, Urine AMBER (*)    APPearance HAZY (*)    Protein, ur 30 (*)    Bacteria, UA RARE (*)    All other components within normal limits  CULTURE, BLOOD (ROUTINE X 2)  CULTURE, BLOOD (ROUTINE X 2)  URINE CULTURE  INFLUENZA PANEL BY PCR (TYPE A & B)  I-STAT CG4 LACTIC ACID, ED  I-STAT CG4 LACTIC ACID, ED  TYPE AND SCREEN  PREPARE RBC (CROSSMATCH)  PREPARE PLATELET PHERESIS   WBC  Date Value Ref Range Status  07/07/2018 0.4 (LL) 4.0 - 10.5 K/uL Final    Comment:    REPEATED TO VERIFY THIS RESULT HAS BEEN CALLED TO K. FIELDS RN BY RASHEEDA GREEN ON 10 14 2019 AT 65, AND HAS BEEN READ BACK. RESULTS VERIFIED   06/03/2018 1.1 (LL) 4.0 - 10.5 K/uL Final    Comment:    CRITICAL VALUE NOTED.  VALUE IS CONSISTENT WITH PREVIOUSLY REPORTED AND CALLED VALUE.  06/02/2018 1.1 (LL) 4.0 - 10.5 K/uL Final    Comment:    CRITICAL VALUE NOTED.  VALUE IS CONSISTENT WITH PREVIOUSLY REPORTED AND CALLED VALUE.  06/01/2018 0.7 (LL) 4.0 - 10.5 K/uL Final    Comment:    CRITICAL VALUE NOTED.  VALUE IS CONSISTENT WITH PREVIOUSLY REPORTED AND CALLED VALUE.   WBC Count  Date Value Ref Range Status  07/04/2018 0.4 (LL) 4.0 - 10.5 K/uL Final    Comment:    This result has been called to [place] by Modena Slater on 10 11 2019 at [place], and has been read back.   06/30/2018 0.5 (LL) 4.0 - 10.3 K/uL Final    Comment:    CRITICAL RESULT CALLED TO, READ BACK BY AND VERIFIED WITH: tammy    06/25/2018 0.5 (LL) 4.0 - 10.3 K/uL Final    Comment:    CRITICAL RESULT CALLED TO, READ BACK BY AND VERIFIED WITH: Kelby Aline at 1240 by Prudencio Burly   06/23/2018 0.6 (LL) 4.0 - 10.3 K/uL Final  Comment:    CRITICAL RESULT CALLED TO, READ BACK BY AND VERIFIED WITH: Jethro Bolus, RN 1300     EKG EKG  Interpretation  Date/Time:  Monday July 07 2018 14:18:23 EDT Ventricular Rate:  115 PR Interval:    QRS Duration: 153 QT Interval:  359 QTC Calculation: 497 R Axis:   78 Text Interpretation:  Sinus tachycardia Right bundle branch block Non-specific ST-t changes No significant change since last tracing Confirmed by Lajean Saver [id number]) on 07/07/2018 4:27:33 PM   Radiology Dg Chest Portable 1 View  Result Date: 07/07/2018 CLINICAL DATA:  Patient fell in a parking lot, possible syncopal episode. The patient is shivering and the skin is hot to the touch. History of COPD-asthma, former smoker, myelodysplastic syndrome. EXAM: PORTABLE CHEST 1 VIEW COMPARISON:  PA and lateral chest x-ray of May 24, 2018. FINDINGS: The lungs are adequately inflated. The interstitial markings are coarse and more conspicuous than on the previous study. The heart is top-normal in size. The pulmonary vascularity is not clearly engorged. There is calcification in the wall of the thoracic aorta. The right-sided PICC line tip terminates at the junction of the middle and distal thirds of the SVC. There is no pleural effusion. The observed bony thorax exhibits no acute abnormality. IMPRESSION: Mildly increased lung markings bilaterally may reflect interstitial edema of cardiac or noncardiac cause. Interstitial pneumonia is felt less likely. There is underlying COPD. Thoracic aortic atherosclerosis. Electronically Signed   By: David  [place]ique M.D.   On: 07/07/2018 15:15    Procedures .Critical Care Performed by: Lorayne Bender, PA-C Authorized by: Lorayne Bender, PA-C   Critical care provider statement:    Critical care time (minutes):  45   Critical care time was exclusive of:  Separately billable procedures and treating other patients   Critical care was necessary to treat or prevent imminent or life-threatening deterioration of the following conditions:  Sepsis and circulatory failure   Critical care was time spent  personally by me on the following activities:  Discussions with consultants, evaluation of patient's response to treatment, examination of patient, obtaining history from patient or surrogate, review of old charts, re-evaluation of patient's condition, pulse oximetry, ordering and review of radiographic studies, ordering and review of laboratory studies, ordering and performing treatments and interventions and development of treatment plan with patient or surrogate   I assumed direction of critical care for this patient from another provider in my specialty: no     (including critical care time)  Medications Ordered in ED Medications  metroNIDAZOLE (FLAGYL) IVPB 500 mg (500 mg Intravenous New Bag/Given 07/07/18 1626)  sodium chloride 0.9 % bolus 1,000 mL (1,000 mLs Intravenous New Bag/Given 07/07/18 1452)    And  sodium chloride 0.9 % bolus 1,000 mL (1,000 mLs Intravenous New Bag/Given 07/07/18 1452)    And  sodium chloride 0.9 % bolus 250 mL (has no administration in time range)  vancomycin (VANCOCIN) 1,500 mg in sodium chloride 0.9 % 500 mL IVPB (has no administration in time range)  traMADol (ULTRAM) tablet 50 mg (has no administration in time range)  0.9 %  sodium chloride infusion (Manually program via Guardrails IV Fluids) (has no administration in time range)  ceFEPIme (MAXIPIME) 2 g in sodium chloride 0.9 % 100 mL IVPB (0 g Intravenous Stopped 07/07/18 1622)  ibuprofen (ADVIL,MOTRIN) tablet 600 mg (600 mg Oral Given 07/07/18 1538)     Initial Impression / Assessment and Plan / ED Course  I have reviewed the triage  vital signs and the nursing notes.  Pertinent labs & imaging results that were available during my care of the patient were reviewed by me and considered in my medical decision making (see chart for details).  Clinical Course as of Jul 07 1704  Mon Jul 07, 2018  1558 Spoke with Dr. Laren Everts, hospitalist. Agrees to assess the patient for admission.    [SJ]  1623 Improved  from previous value.  Patient received platelet transfusion today.  Platelets(!!): 16 [SJ]  72 Called Dr. Lindi Adie, on call for medical oncology, to let him know of patient's presence here in ED and to discuss his case. Reached VM, left message.   [SJ]  1641 Spoke with Dr. [name], patient's oncologist.  Agrees with admission and broad-spectrum antibiotics.  He will need crossmatched platelets in the morning, but tonight he can have 1 unit of a pheresis platelets.  2 units PRBCs tonight as well.   [SJ]  69 Updated Dr. Laren Everts on Dr. Gearldine Shown recommendations. States he plans on consulting general surgery for the patient's wound. Requests that I put in order for CT.    [SJ]    Clinical Course User Index [SJ] Kahlan Engebretson C, PA-C    Patient presents following an episode of weakness.  Found to be febrile and tachycardic.  Possible source is his sacral wounds, though febrile neutropenia is also a possibility.  Initiated code sepsis and protocol.  Admitted for further management.   Findings and plan of care discussed with Lajean Saver, MD. Dr. Ashok Cordia personally evaluated and examined this patient.  Vitals:   07/07/18 1544 07/07/18 1545 07/07/18 1600 07/07/18 1615  BP:  (!) 108/57 (!) 98/53 (!) 93/48  Pulse:  (!) 123 (!) 104 99  Resp:  16 (!) 23 14  Temp:      TempSrc:      SpO2:  100% 97% 100%  Weight: 67.5 kg     Height: '5\' 7"'$  (1.702 m)         Final Clinical Impressions(s) / ED Diagnoses   Final diagnoses:  Fever, unspecified fever cause  Wound of sacral region, initial encounter    ED Discharge Orders    None      Layla Maw 07/07/18 1634    Lorayne Bender, PA-C 07/07/18 1638    Lajean Saver, MD 07/07/18 [place], [place], PA-C 07/07/18 1705    Lajean Saver, MD 07/08/18 [phone]

## 2018-07-07 NOTE — H&P (Signed)
[facility]                                                                                    Patient Demographics  Andrew [name], is a 67 y.o. male  CSN: [id number]  MRN: [id number]  DOB - [birth year]  Admit Date - 07/07/2018  Outpatient Primary MD for the patient is [name], MD   With History of -  Past Medical History:  Diagnosis Date  . Abdominal bloating    probable lactose intolorence  . Anemia 04/03/2018  . Asthma   . COPD (chronic obstructive pulmonary disease) (HCC)    mild improvement  . DM (diabetes mellitus) ([place])   . Hearing impairment    asymptomatic  . HTN (hypertension)   . Tobacco use   . Vitamin D deficiency       Past Surgical History:  Procedure Laterality Date  . BIOPSY  04/03/2018   Procedure: BIOPSY;  Surgeon: Otis Brace, MD;  Location: [place];  Service: Gastroenterology;;  . COLONOSCOPY WITH PROPOFOL N/A 04/04/2018   Procedure: COLONOSCOPY WITH PROPOFOL;  Surgeon: Otis Brace, MD;  Location: [place];  Service: Gastroenterology;  Laterality: N/A;  . ESOPHAGOGASTRODUODENOSCOPY (EGD) WITH PROPOFOL N/A 04/03/2018   Procedure: ESOPHAGOGASTRODUODENOSCOPY (EGD) WITH PROPOFOL;  Surgeon: Otis Brace, MD;  Location: [facility];  Service: Gastroenterology;  Laterality: N/A;  . EYE SURGERY      in for   Chief Complaint  Patient presents with  . Code Sepsis     HPI  [name]  is a 67 y.o. male, 67 year old male presenting with 1 week history of fever weakness that has been getting worse.  Patient has a history of myelodysplastic disorder/with multilineage dysplasia being followed by Dr. Learta Codding, renal insufficiency, diabetes mellitus and right buttock ulcer/wound being followed by his primary medical doctor.  Patient denies any history of chest pains or shortness of breath.  No history of diarrhea.  Patient had a fall today while at the ATM machine and he is becoming extremely weak.  I reviewed his  history from the chart review and it seems that he has been having decreased p.o. intake and was on p.o. antibiotics for prophylaxis in addition to Valtrex and he was on a sitz bath for his left rectal ulcer.  Patient lives at home with his daughter who takes care of him    Review of Systems    In addition to the HPI above,  No Headache, No changes with Vision or hearing, No problems swallowing food or Liquids, No Chest pain, Cough or Shortness of Breath, No Abdominal pain, No Nausea or Vommitting, Bowel movements are regular, No Blood in stool or Urine, No dysuria, No new skin rashes or bruises, No new joints pains-aches,  No new weakness, tingling, numbness in any extremity, No recent weight gain or loss, No polyuria, polydypsia or polyphagia, No significant Mental Stressors.  A full 10 point Review of Systems was done, except as stated above, all other Review of Systems were negative.   Social History Social History   Tobacco Use  . Smoking status: Former Smoker    Packs/day: 0.25    Types: Cigarettes  Last attempt to quit: 03/02/2018    Years since quitting: 0.3  . Smokeless tobacco: Never Used  Substance Use Topics  . Alcohol use: Yes    Frequency: Never    Comment: occ     Family History No family history on file.   Prior to Admission medications   Medication Sig Start Date End Date Taking? Authorizing Provider  acyclovir (ZOVIRAX) 400 MG tablet Take 1 tablet (400 mg total) by mouth 2 (two) times daily. 06/03/18  Yes Owens Shark, NP  benazepril (LOTENSIN) 40 MG tablet Take 40 mg by mouth daily. 03/11/18  Yes [provider]  chlorhexidine (PERIDEX) 0.12 % solution Use as directed 15 mLs in the mouth or throat 2 (two) times daily. 06/03/18  Yes Owens Shark, NP  dorzolamide-timolol (COSOPT) 22.3-6.8 MG/ML ophthalmic solution Place 1 drop into both eyes 2 (two) times daily. 02/22/18  Yes [provider]  fluconazole (DIFLUCAN) 50 MG tablet Take  1 tablet (50 mg total) by mouth daily. 06/04/18  Yes Owens Shark, NP  gemfibrozil (LOPID) 600 MG tablet Take 600 mg by mouth 2 (two) times daily. 06/12/18  Yes [provider]  levofloxacin (LEVAQUIN) 500 MG tablet Take 1 tablet (500 mg total) by mouth daily. 06/24/18  Yes Ladell Pier, MD  metoprolol tartrate (LOPRESSOR) 25 MG tablet Take 25 mg by mouth 2 (two) times daily.    Yes [provider]  montelukast (SINGULAIR) 10 MG tablet Take 10 mg by mouth daily.  05/10/18  Yes [provider]  prochlorperazine (COMPAZINE) 5 MG tablet TAKE 1 TABLET(5 MG) BY MOUTH EVERY 6 HOURS AS NEEDED FOR NAUSEA OR VOMITING Patient taking differently: Take 5 mg by mouth every 6 (six) hours as needed for nausea or vomiting.  05/27/18  Yes Owens Shark, NP  sorbitol 70 % solution Take 15 mLs by mouth daily as needed. Patient taking differently: Take 15 mLs by mouth daily as needed (constipation).  06/17/18  Yes Ladell Pier, MD  tetrahydrozoline-zinc (VISINE-AC) 0.05-0.25 % ophthalmic solution Place 2 drops into both eyes as needed (redness).    Yes [provider]  traMADol (ULTRAM) 50 MG tablet Take 1 tablet (50 mg total) by mouth every 6 (six) hours as needed. 06/25/18  Yes Ladell Pier, MD  TRAVATAN Z 0.004 % SOLN ophthalmic solution Place 1 drop into both eyes every evening. 02/22/18  Yes [provider]  triamterene-hydrochlorothiazide (MAXZIDE-25) 37.5-25 MG per tablet Take 1 tablet by mouth daily.   Yes [provider]  VENTOLIN HFA 108 (90 Base) MCG/ACT inhaler Inhale 1-2 puffs into the lungs 4 (four) times daily as needed. 02/15/18  Yes [provider]  pantoprazole (PROTONIX) 40 MG tablet Take 1 tablet (40 mg total) by mouth 2 (two) times daily. Patient not taking: Reported on 07/07/2018 04/04/18   Hosie Poisson, MD    Allergies  Allergen Reactions  . Shellfish Allergy Hives    " I BREAK OUT IN HIVES "    Physical  Exam  Vitals  Blood pressure (!) 93/48, pulse 99, temperature (!) 102.8 F (39.3 C), temperature source Oral, resp. rate 14, height '5\' 7"'$  (1.702 m), weight 67.5 kg, SpO2 100 %.   1. General chronically ill male, looks tired  2. Normal affect and insight, Not Suicidal or Homicidal, mildly confused  3. No F.N deficits, grossly.  4. Ears and Eyes appear Normal, Conjunctivae clear, PERRLA. Moist Oral Mucosa.  5. Supple Neck, No JVD, No  cervical lymphadenopathy appriciated, No Carotid Bruits.  6. Symmetrical Chest wall movement, Good air movement bilaterally, CTAB.  7. RRR, No Gallops, Rubs or Murmurs, No Parasternal Heave.  8. Positive Bowel Sounds, Abdomen Soft, Non tender, .  9.  No Cyanosis, Normal Skin Turgor, No Skin Rash or Bruise.  10. Good muscle tone,  joints appear normal , no effusions, Normal ROM.  Rectum was examined right buttocks ulcer noted 6 x 3 cm draining wound  Data Review  CBC Recent Labs  Lab 07/04/18 0812 07/07/18 1442  WBC 0.4* 0.4*  HGB 8.1* 7.4*  HCT 24.3* 22.8*  PLT 5* 16*  MCV 89.3 89.4  MCH 29.8 29.0  MCHC 33.3 32.5  RDW 12.8 12.1  LYMPHSABS 0.3* 0.3*  MONOABS 0.0* 0.0*  EOSABS 0.0 0.0  BASOSABS 0.0 0.0   ------------------------------------------------------------------------------------------------------------------  Chemistries  Recent Labs  Lab 07/07/18 1442  NA 131*  K 3.8  CL 98  CO2 22  GLUCOSE 153*  BUN 34*  CREATININE 1.58*  CALCIUM 8.8*  AST 27  ALT 31  ALKPHOS 161*  BILITOT 0.7   ------------------------------------------------------------------------------------------------------------------ estimated creatinine clearance is 43.6 mL/min (A) (by C-G formula based on SCr of 1.58 mg/dL (H)). ------------------------------------------------------------------------------------------------------------------ No results for input(s): TSH, T4TOTAL, T3FREE, THYROIDAB in the last 72 hours.  Invalid input(s):  FREET3   Coagulation profile No results for input(s): INR, PROTIME in the last 168 hours. ------------------------------------------------------------------------------------------------------------------- No results for input(s): DDIMER in the last 72 hours. -------------------------------------------------------------------------------------------------------------------  Cardiac Enzymes No results for input(s): CKMB, TROPONINI, MYOGLOBIN in the last 168 hours.  Invalid input(s): CK ------------------------------------------------------------------------------------------------------------------ Invalid input(s): POCBNP   ---------------------------------------------------------------------------------------------------------------  Urinalysis    Component Value Date/Time   COLORURINE AMBER (A) 07/07/2018 1556   APPEARANCEUR HAZY (A) 07/07/2018 1556   LABSPEC 1.019 07/07/2018 1556   PHURINE 5.0 07/07/2018 1556   GLUCOSEU NEGATIVE 07/07/2018 1556   HGBUR NEGATIVE 07/07/2018 1556   BILIRUBINUR NEGATIVE 07/07/2018 1556   KETONESUR NEGATIVE 07/07/2018 1556   PROTEINUR 30 (A) 07/07/2018 1556   NITRITE NEGATIVE 07/07/2018 1556   LEUKOCYTESUR NEGATIVE 07/07/2018 1556    ----------------------------------------------------------------------------------------------------------------  Imaging results:   Dg Chest Portable 1 View  Result Date: 07/07/2018 CLINICAL DATA:  Patient fell in a parking lot, possible syncopal episode. The patient is shivering and the skin is hot to the touch. History of COPD-asthma, former smoker, myelodysplastic syndrome. EXAM: PORTABLE CHEST 1 VIEW COMPARISON:  PA and lateral chest x-ray of May 24, 2018. FINDINGS: The lungs are adequately inflated. The interstitial markings are coarse and more conspicuous than on the previous study. The heart is top-normal in size. The pulmonary vascularity is not clearly engorged. There is calcification in the wall of  the thoracic aorta. The right-sided PICC line tip terminates at the junction of the middle and distal thirds of the SVC. There is no pleural effusion. The observed bony thorax exhibits no acute abnormality. IMPRESSION: Mildly increased lung markings bilaterally may reflect interstitial edema of cardiac or noncardiac cause. Interstitial pneumonia is felt less likely. There is underlying COPD. Thoracic aortic atherosclerosis. Electronically Signed   By: David  Martinique M.D.   On: 07/07/2018 15:15    My personal review of EKG: Right bundle branch block at 115 bpm with no ST/T wave inversions or changes    Assessment & Plan  Sepsis; source unclear Flu test is still pending Probably due to rectal ulcer infection Continue with IV vancomycin cefepime and Flagyl and continue with IV fluids  Rectal ulcer/wound CT of abdomen and  pelvis ordered May need surgical consult in a.m. Continue with IV antibiotics  Myelodysplastic disorder; followed by Dr. Learta Codding Blood and platelets transfusion today\ Dr. Learta Codding will see patient in a.m.  Renal insufficiency stage III    IV fluids and monitor  History of COPD; continue with neb treatments  History of diabetes mellitus    Insulin sliding scale   DVT Prophylaxis SCDs  AM Labs Ordered, also please review Full Orders  Family Communication: Admission, patients condition and plan of care including tests being ordered have been discussed with the patient and daughter who indicate understanding and agree with the plan and Code Status.  Code Status full  Disposition Plan: Undetermined  Time spent in minutes : 48 minutes  Condition GUARDED   '@SIGNATURE'$ @

## 2018-07-08 ENCOUNTER — Encounter (HOSPITAL_COMMUNITY): Payer: Self-pay

## 2018-07-08 ENCOUNTER — Ambulatory Visit: Payer: Self-pay | Admitting: Nurse Practitioner

## 2018-07-08 DIAGNOSIS — K611 Rectal abscess: Secondary | ICD-10-CM | POA: Diagnosis present

## 2018-07-08 DIAGNOSIS — K056 Periodontal disease, unspecified: Secondary | ICD-10-CM

## 2018-07-08 DIAGNOSIS — D469 Myelodysplastic syndrome, unspecified: Secondary | ICD-10-CM

## 2018-07-08 DIAGNOSIS — K921 Melena: Secondary | ICD-10-CM

## 2018-07-08 DIAGNOSIS — E1129 Type 2 diabetes mellitus with other diabetic kidney complication: Secondary | ICD-10-CM

## 2018-07-08 DIAGNOSIS — D508 Other iron deficiency anemias: Secondary | ICD-10-CM

## 2018-07-08 DIAGNOSIS — D61818 Other pancytopenia: Secondary | ICD-10-CM

## 2018-07-08 DIAGNOSIS — R5081 Fever presenting with conditions classified elsewhere: Secondary | ICD-10-CM

## 2018-07-08 DIAGNOSIS — N289 Disorder of kidney and ureter, unspecified: Secondary | ICD-10-CM

## 2018-07-08 DIAGNOSIS — D709 Neutropenia, unspecified: Secondary | ICD-10-CM

## 2018-07-08 LAB — CBC
HEMATOCRIT: 24.5 % — AB (ref 39.0–52.0)
HEMOGLOBIN: 8.1 g/dL — AB (ref 13.0–17.0)
MCH: 28.6 pg (ref 26.0–34.0)
MCHC: 33.1 g/dL (ref 30.0–36.0)
MCV: 86.6 fL (ref 80.0–100.0)
NRBC: 0 % (ref 0.0–0.2)
Platelets: 11 10*3/uL — CL (ref 150–400)
RBC: 2.83 MIL/uL — ABNORMAL LOW (ref 4.22–5.81)
RDW: 14 % (ref 11.5–15.5)
WBC: 0.3 10*3/uL — AB (ref 4.0–10.5)

## 2018-07-08 LAB — COMPREHENSIVE METABOLIC PANEL
ALBUMIN: 2 g/dL — AB (ref 3.5–5.0)
ALK PHOS: 139 U/L — AB (ref 38–126)
ALT: 24 U/L (ref 0–44)
ANION GAP: 9 (ref 5–15)
AST: 14 U/L — ABNORMAL LOW (ref 15–41)
BUN: 20 mg/dL (ref 8–23)
CHLORIDE: 103 mmol/L (ref 98–111)
CO2: 23 mmol/L (ref 22–32)
Calcium: 8.8 mg/dL — ABNORMAL LOW (ref 8.9–10.3)
Creatinine, Ser: 0.84 mg/dL (ref 0.61–1.24)
GFR calc Af Amer: >60 mL/min (ref >60)
GFR calc non Af Amer: >60 mL/min (ref >60)
GLUCOSE: 124 mg/dL — AB (ref 70–99)
POTASSIUM: 4 mmol/L (ref 3.5–5.1)
SODIUM: 135 mmol/L (ref 135–145)
Total Bilirubin: 2 mg/dL — ABNORMAL HIGH (ref 0.3–1.2)
Total Protein: 7 g/dL (ref 6.5–8.1)

## 2018-07-08 LAB — GLUCOSE, CAPILLARY
Glucose-Capillary: 112 mg/dL — ABNORMAL HIGH (ref 70–99)
Glucose-Capillary: 171 mg/dL — ABNORMAL HIGH (ref 70–99)
Glucose-Capillary: 182 mg/dL — ABNORMAL HIGH (ref 70–99)
Glucose-Capillary: 141 mg/dL — ABNORMAL HIGH (ref 70–99)

## 2018-07-08 LAB — INFLUENZA PANEL BY PCR (TYPE A & B)
Influenza A By PCR: NEGATIVE
Influenza B By PCR: NEGATIVE

## 2018-07-08 LAB — URINE CULTURE: CULTURE: NO GROWTH

## 2018-07-08 LAB — PROCALCITONIN: Procalcitonin: 0.3 ng/mL

## 2018-07-08 LAB — PATHOLOGIST SMEAR REVIEW

## 2018-07-08 LAB — TROPONIN I

## 2018-07-08 LAB — MAGNESIUM: Magnesium: 2 mg/dL (ref 1.7–2.4)

## 2018-07-08 LAB — LACTIC ACID, PLASMA: Lactic Acid, Venous: 0.8 mmol/L (ref 0.5–1.9)

## 2018-07-08 MED ORDER — SODIUM CHLORIDE 0.9 % IV BOLUS
500.0000 mL | Freq: Once | INTRAVENOUS | Status: AC
  Administered 2018-07-08: 500 mL via INTRAVENOUS

## 2018-07-08 MED ORDER — ACETAMINOPHEN 325 MG PO TABS
650.0000 mg | ORAL_TABLET | ORAL | Status: DC | PRN
  Administered 2018-07-08 – 2018-07-10 (×4): 650 mg via ORAL
  Filled 2018-07-08 (×4): qty 2

## 2018-07-08 MED ORDER — ALTEPLASE 2 MG IJ SOLR
2.0000 mg | Freq: Once | INTRAMUSCULAR | Status: DC
  Filled 2018-07-08: qty 2

## 2018-07-08 MED ORDER — ENSURE ENLIVE PO LIQD
237.0000 mL | Freq: Three times a day (TID) | ORAL | Status: DC
  Administered 2018-07-09 – 2018-07-10 (×3): 237 mL via ORAL

## 2018-07-08 NOTE — Progress Notes (Signed)
IP PROGRESS NOTE  Subjective:   Andrew [name] was admitted yesterday after a fall at an [employer].  He was noted to have a high fever.  He was admitted for further evaluation. He complains of discomfort at the left inner thigh following the fall.  No other complaint.  He had a fever during a platelet transfusion on 07/05/2018.  Objective: Vital signs in last 24 hours: Blood pressure (!) 90/49, pulse 66, temperature 98.1 F (36.7 C), temperature source Oral, resp. rate 14, height '5\' 7"'$  (1.702 m), weight 146 lb 9.7 oz (66.5 kg), SpO2 100 %.  Intake/Output from previous day: 10/14 0701 - 10/15 0700 In: 604 [P.O.:120; Blood:284; IV Piggyback:200] Out: 640 [Urine:640]  Physical Exam:  HEENT: Poor dentition, thick white coat over the tongue, no oral bleeding Lungs: Clear bilaterally, no respiratory distress Cardiac: Regular rate and rhythm Abdomen: Nontender, no hepatosplenomegaly Extremities: No leg edema Rectal: Multiple ulcers surrounding the anus but no fluctuance or erythema.  No bleeding.  Portacath/PICC-without erythema  Lab Results: Recent Labs    07/07/18 1442  WBC 0.4*  HGB 7.4*  HCT 22.8*  PLT 16*    BMET Recent Labs    07/07/18 1442  NA 131*  K 3.8  CL 98  CO2 22  GLUCOSE 153*  BUN 34*  CREATININE 1.58*  CALCIUM 8.8*    No results found for: CEA1  Studies/Results: Ct Abdomen Pelvis W Contrast  Result Date: 07/07/2018 CLINICAL DATA:  Recent onset of weakness. Opened wound of the anus. History of COPD, diabetes, hypertension and myelodysplasia. EXAM: CT ABDOMEN AND PELVIS WITH CONTRAST TECHNIQUE: Multidetector CT imaging of the abdomen and pelvis was performed using the standard protocol following bolus administration of intravenous contrast. CONTRAST:  154m OMNIPAQUE IOHEXOL 300 MG/ML  SOLN COMPARISON:  Abdominopelvic CT 04/02/2018. FINDINGS: Lower chest: Images through the lung bases are degraded by breathing artifact. Subpleural right lower lobe nodule  measuring 7 mm on image 20/4 similar to the previous study. There is a new 7 mm right lower lobe nodule on image 20/4 as well as a new 6 mm right lower lobe nodule on image 11/4. The heart is enlarged. No significant pleural or pericardial effusion. Hepatobiliary: The liver is normal in density without focal abnormality. No evidence of gallstones, gallbladder wall thickening or biliary dilatation. Pancreas: Unremarkable. No pancreatic ductal dilatation or surrounding inflammatory changes. Spleen: Normal in size without focal abnormality. Adrenals/Urinary Tract: Both adrenal glands appear normal. There is a stable tiny nonobstructing calculus in the lower pole of the left kidney, best seen on sagittal image 75/8. No evidence of ureteral calculus, hydronephrosis or perinephric soft tissue stranding. Probable tiny cyst anteriorly in the interpolar region of the left kidney. The bladder appears unremarkable. Stomach/Bowel: No evidence of bowel wall thickening, distention or surrounding inflammatory change. The appendix appears normal. There is moderate stool throughout the colon. There is a perianal air-fluid collection measuring up to 3.4 x 1.9 cm on axial image 100/3, likely reflecting a fistula and abscess. This is best demonstrated on sagittal images 55 through 60/8 and demonstrates surrounding enhancement. No evidence of intrapelvic abscess. Vascular/Lymphatic: There are no enlarged abdominal or pelvic lymph nodes. Small inguinal lymph nodes are likely reactive. Moderate aortic and branch vessel atherosclerosis. Reproductive: Stable mild enlargement of the prostate gland. Other: Small umbilical hernia containing only fat. No ascites, free air or intra-abdominal abscess. Musculoskeletal: No acute or significant osseous findings. IMPRESSION: 1. New perianal air-fluid collection with surrounding enhancement consistent with fistula and perianal abscess.  2. No evidence of bowel obstruction or intra-abdominal abscess. 3.  Increased nodularity at the right lung base since recent CT of 3 months ago, possibly inflammatory. Follow-up chest CT in 3-6 months recommended to exclude the possibility of metastatic disease. 4.  Aortic Atherosclerosis (ICD10-I70.0). Electronically Signed   By: Richardean Sale M.D.   On: 07/07/2018 19:31   Dg Chest Portable 1 View  Result Date: 07/07/2018 CLINICAL DATA:  Patient fell in a parking lot, possible syncopal episode. The patient is shivering and the skin is hot to the touch. History of COPD-asthma, former smoker, myelodysplastic syndrome. EXAM: PORTABLE CHEST 1 VIEW COMPARISON:  PA and lateral chest x-ray of May 24, 2018. FINDINGS: The lungs are adequately inflated. The interstitial markings are coarse and more conspicuous than on the previous study. The heart is top-normal in size. The pulmonary vascularity is not clearly engorged. There is calcification in the wall of the thoracic aorta. The right-sided PICC line tip terminates at the junction of the middle and distal thirds of the SVC. There is no pleural effusion. The observed bony thorax exhibits no acute abnormality. IMPRESSION: Mildly increased lung markings bilaterally may reflect interstitial edema of cardiac or noncardiac cause. Interstitial pneumonia is felt less likely. There is underlying COPD. Thoracic aortic atherosclerosis. Electronically Signed   By: David  Martinique M.D.   On: 07/07/2018 15:15    Medications: I have reviewed the patient's current medications.  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  Cycle 1 5-azacytidine 05/06/2018  Cycle25-azacytidine 06/16/2018 2.Renal  insufficiency 3.Diabetes 4.Hemoccult positive stool  Upper endoscopy 04/03/2018-chronic gastritis, duodenitis  Colonoscopy 04/04/2018-diverticulosis in the sigmoid colon 5.Right lower lobe 7 mm subpleural nodule on chest CT 04/02/2018 6.Admission with febrile neutropenia 05/20/2018- placed on cefepime, antibiotics changed to vancomycin/meropenem on 05/21/2018, anidulafungin added 9/1/2019antibiotics stopped 05/31/2018, discharge 06/03/2018 with prophylactic acyclovir, Bactrim, and Diflucan. Bactrim discontinued and Levaquin started 06/05/2018 7. 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 Febrile reaction following a platelet transfusion 05/29/2018, 06/05/2018, 07/05/2018 11.Perineal ulceration versus fistulae-started sitz baths 06/23/2018  CT 07/07/2018-perianal abscess 12.  Weight loss 13.  Admission with fever and severe neutropenia 07/07/2018   Mr. Dematteo has persistent severe pancytopenia secondary to myelodysplasia.  He is completed 2 cycles of 5 azacytidine therapy without improvement.  He continues to be dependent on red cell and platelet transfusions.  He is now admitted with a fever, likely secondary to a perianal abscess.  He has multiple perianal ulcerations.  Recommendations: 1.  Transfuse platelets for bleeding or a count of less than 5000, also transfuse platelets as needed for a surgical procedure 2.  Continue broad-spectrum intravenous antibiotics, follow-up cultures 3.  Proceed with restaging bone marrow biopsy 4.  Skin care/surgical consult for management of the anal ulcers and abscess     LOS: 1 day   Betsy Coder, MD   07/08/2018, 6:19 AM

## 2018-07-08 NOTE — Progress Notes (Signed)
Chief Complaint: Patient was seen in consultation today for bone marrow biopsy at the request of Dr. Betsy Coder  Referring Physician(s): Dr. Betsy Coder  Supervising Physician: Aletta Edouard  Patient Status: [facility] - In-pt  History of Present Illness: Andrew [name] is a 67 y.o. male admitted with fever and pancytopenia after a fall. His workup is ongoing and hematology requests bone marrow biopsy. PMHx, meds, labs, imaging, allergies reviewed. Feels well otherwise this am. No specific complaints   Past Medical History:  Diagnosis Date  . Abdominal bloating    probable lactose intolorence  . Anemia 04/03/2018  . Asthma   . COPD (chronic obstructive pulmonary disease) (HCC)    mild improvement  . DM (diabetes mellitus) ([place])   . Hearing impairment    asymptomatic  . HTN (hypertension)   . Tobacco use   . Vitamin D deficiency     Past Surgical History:  Procedure Laterality Date  . BIOPSY  04/03/2018   Procedure: BIOPSY;  Surgeon: Otis Brace, MD;  Location: [place];  Service: Gastroenterology;;  . COLONOSCOPY WITH PROPOFOL N/A 04/04/2018   Procedure: COLONOSCOPY WITH PROPOFOL;  Surgeon: Otis Brace, MD;  Location: [place];  Service: Gastroenterology;  Laterality: N/A;  . ESOPHAGOGASTRODUODENOSCOPY (EGD) WITH PROPOFOL N/A 04/03/2018   Procedure: ESOPHAGOGASTRODUODENOSCOPY (EGD) WITH PROPOFOL;  Surgeon: Otis Brace, MD;  Location: [facility];  Service: Gastroenterology;  Laterality: N/A;  . EYE SURGERY      Allergies: Shellfish allergy  Medications:  Current Facility-Administered Medications:  .  0.9 %  sodium chloride infusion (Manually program via Guardrails IV Fluids), , Intravenous, Once, Merton Border, MD .  acetaminophen (TYLENOL) tablet 650 mg, 650 mg, Oral, Q4H PRN, Schorr, Rhetta Mura, NP, 650 mg at 07/08/18 0159 .  acyclovir (ZOVIRAX) tablet 400 mg, 400 mg, Oral, BID, Merton Border, MD, 400 mg at 07/08/18 6144 .  alteplase  (CATHFLO ACTIVASE) injection 2 mg, 2 mg, Intracatheter, Once, Lala Lund K, MD .  chlorhexidine (PERIDEX) 0.12 % solution 15 mL, 15 mL, Mouth/Throat, BID, Merton Border, MD, 15 mL at 07/08/18 0926 .  dorzolamide-timolol (COSOPT) 22.3-6.8 MG/ML ophthalmic solution 1 drop, 1 drop, Both Eyes, BID, Merton Border, MD, 1 drop at 07/08/18 0922 .  feeding supplement (ENSURE ENLIVE) (ENSURE ENLIVE) liquid 237 mL, 237 mL, Oral, BID BM, Hijazi, Ali, MD .  gemfibrozil (LOPID) tablet 600 mg, 600 mg, Oral, BID, Merton Border, MD, 600 mg at 07/07/18 2229 .  Influenza vac split quadrivalent PF (FLUZONE HIGH-DOSE) injection 0.5 mL, 0.5 mL, Intramuscular, Tomorrow-1000, Hijazi, Ali, MD .  insulin aspart (novoLOG) injection 0-5 Units, 0-5 Units, Subcutaneous, QHS, Hijazi, Ali, MD .  insulin aspart (novoLOG) injection 0-9 Units, 0-9 Units, Subcutaneous, TID WC, Hijazi, Ali, MD .  ipratropium-albuterol (DUONEB) 0.5-2.5 (3) MG/3ML nebulizer solution 3 mL, 3 mL, Nebulization, BID, Merton Border, MD, 3 mL at 07/08/18 0820 .  latanoprost (XALATAN) 0.005 % ophthalmic solution 1 drop, 1 drop, Both Eyes, QHS, Hijazi, Deatra Canter, MD, 1 drop at 07/07/18 2232 .  metoprolol tartrate (LOPRESSOR) tablet 25 mg, 25 mg, Oral, BID, Merton Border, MD, 25 mg at 07/07/18 2229 .  morphine 2 MG/ML injection 1 mg, 1 mg, Intravenous, Q3H PRN, Merton Border, MD .  ondansetron (ZOFRAN) tablet 4 mg, 4 mg, Oral, Q6H PRN **OR** ondansetron (ZOFRAN) injection 4 mg, 4 mg, Intravenous, Q6H PRN, Merton Border, MD .  pantoprazole (PROTONIX) EC tablet 40 mg, 40 mg, Oral, BID, Merton Border, MD, 40 mg at 07/08/18 0925 .  piperacillin-tazobactam (  ZOSYN) IVPB 3.375 g, 3.375 g, Intravenous, Q8H, Wisniewski, Corinne J, Student-PharmD, Last Rate: 12.5 mL/hr at 07/08/18 1010, 3.375 g at 07/08/18 1010 .  pneumococcal 23 valent vaccine (PNU-IMMUNE) injection 0.5 mL, 0.5 mL, Intramuscular, Tomorrow-1000, Hijazi, Ali, MD .  [COMPLETED] sodium chloride 0.9 % bolus 1,000 mL, 1,000 mL,  Intravenous, Once, Stopped at 07/08/18 0048 **AND** [COMPLETED] sodium chloride 0.9 % bolus 1,000 mL, 1,000 mL, Intravenous, Once, Stopped at 07/08/18 0048 **AND** sodium chloride 0.9 % bolus 250 mL, 250 mL, Intravenous, Once, Hijazi, Deatra Canter, MD .  sodium chloride flush (NS) 0.9 % injection 10-40 mL, 10-40 mL, Intracatheter, PRN, Merton Border, MD .  traMADol (ULTRAM) tablet 50 mg, 50 mg, Oral, Q6H PRN, Merton Border, MD, 50 mg at 07/08/18 0024 .  vancomycin (VANCOCIN) 500 mg in sodium chloride 0.9 % 100 mL IVPB, 500 mg, Intravenous, Q12H, Wisniewski, Corinne J, Student-PharmD  Facility-Administered Medications Ordered in Other Encounters:  .  0.9 %  sodium chloride infusion (Manually program via Guardrails IV Fluids), 250 mL, Intravenous, Once, Betsy Coder B, MD .  0.9 %  sodium chloride infusion, , Intravenous, Continuous, Ladell Pier, MD, Stopped at 06/17/18 1027 .  heparin lock flush 100 unit/mL, 500 Units, Intracatheter, Daily PRN, [name] K, NP .  sodium chloride flush (NS) 0.9 % injection 10 mL, 10 mL, Intracatheter, PRN, Ladell Pier, MD    History reviewed. No pertinent family history.  Social History   Socioeconomic History  . Marital status: Single    Spouse name: Not on file  . Number of children: 1  . Years of education: 68  . Highest education level: Bachelor's degree (e.g., BA, AB, BS)  Occupational History  . Occupation: retired  Scientific laboratory technician  . Financial resource strain: Not hard at all  . Food insecurity:    Worry: Never true    Inability: Never true  . Transportation needs:    Medical: No    Non-medical: No  Tobacco Use  . Smoking status: Former Smoker    Packs/day: 0.25    Types: Cigarettes    Last attempt to quit: 03/02/2018    Years since quitting: 0.3  . Smokeless tobacco: Never Used  Substance and Sexual Activity  . Alcohol use: Yes    Frequency: Never    Comment: occ  . Drug use: Never  . Sexual activity: Not Currently  Lifestyle  .  Physical activity:    Days per week: 0 days    Minutes per session: 0 min  . Stress: Not at all  Relationships  . Social connections:    Talks on phone: More than three times a week    Gets together: More than three times a week    Attends religious service: More than 4 times per year    Active member of club or organization: Yes    Attends meetings of clubs or organizations: More than 4 times per year    Relationship status: Divorced  Other Topics Concern  . Not on file  Social History Narrative  . Not on file     Review of Systems: A 12 point ROS discussed and pertinent positives are indicated in the HPI above.  All other systems are negative.  Review of Systems  Vital Signs: BP (!) 111/56   Pulse 79   Temp 97.8 F (36.6 C) (Oral)   Resp 16   Ht '5\' 7"'$  (1.702 m)   Wt 66.5 kg   SpO2 95%   BMI 22.96  kg/m   Physical Exam  Constitutional: He is oriented to person, place, and time. He appears well-developed. No distress.  HENT:  Head: Normocephalic.  Mouth/Throat: Oropharynx is clear and moist.  Neck: Normal range of motion. No tracheal deviation present.  Cardiovascular: Normal rate, regular rhythm and normal heart sounds.  Pulmonary/Chest: Effort normal and breath sounds normal. No respiratory distress.  Neurological: He is alert and oriented to person, place, and time.  Skin: Skin is warm and dry.  Psychiatric: He has a normal mood and affect.    Imaging: Ct Abdomen Pelvis W Contrast  Result Date: 07/07/2018 CLINICAL DATA:  Recent onset of weakness. Opened wound of the anus. History of COPD, diabetes, hypertension and myelodysplasia. EXAM: CT ABDOMEN AND PELVIS WITH CONTRAST TECHNIQUE: Multidetector CT imaging of the abdomen and pelvis was performed using the standard protocol following bolus administration of intravenous contrast. CONTRAST:  164m OMNIPAQUE IOHEXOL 300 MG/ML  SOLN COMPARISON:  Abdominopelvic CT 04/02/2018. FINDINGS: Lower chest: Images through the  lung bases are degraded by breathing artifact. Subpleural right lower lobe nodule measuring 7 mm on image 20/4 similar to the previous study. There is a new 7 mm right lower lobe nodule on image 20/4 as well as a new 6 mm right lower lobe nodule on image 11/4. The heart is enlarged. No significant pleural or pericardial effusion. Hepatobiliary: The liver is normal in density without focal abnormality. No evidence of gallstones, gallbladder wall thickening or biliary dilatation. Pancreas: Unremarkable. No pancreatic ductal dilatation or surrounding inflammatory changes. Spleen: Normal in size without focal abnormality. Adrenals/Urinary Tract: Both adrenal glands appear normal. There is a stable tiny nonobstructing calculus in the lower pole of the left kidney, best seen on sagittal image 75/8. No evidence of ureteral calculus, hydronephrosis or perinephric soft tissue stranding. Probable tiny cyst anteriorly in the interpolar region of the left kidney. The bladder appears unremarkable. Stomach/Bowel: No evidence of bowel wall thickening, distention or surrounding inflammatory change. The appendix appears normal. There is moderate stool throughout the colon. There is a perianal air-fluid collection measuring up to 3.4 x 1.9 cm on axial image 100/3, likely reflecting a fistula and abscess. This is best demonstrated on sagittal images 55 through 60/8 and demonstrates surrounding enhancement. No evidence of intrapelvic abscess. Vascular/Lymphatic: There are no enlarged abdominal or pelvic lymph nodes. Small inguinal lymph nodes are likely reactive. Moderate aortic and branch vessel atherosclerosis. Reproductive: Stable mild enlargement of the prostate gland. Other: Small umbilical hernia containing only fat. No ascites, free air or intra-abdominal abscess. Musculoskeletal: No acute or significant osseous findings. IMPRESSION: 1. New perianal air-fluid collection with surrounding enhancement consistent with fistula and  perianal abscess. 2. No evidence of bowel obstruction or intra-abdominal abscess. 3. Increased nodularity at the right lung base since recent CT of 3 months ago, possibly inflammatory. Follow-up chest CT in 3-6 months recommended to exclude the possibility of metastatic disease. 4.  Aortic Atherosclerosis (ICD10-I70.0). Electronically Signed   By: WRichardean SaleM.D.   On: 07/07/2018 19:31   Dg Chest Portable 1 View  Result Date: 07/07/2018 CLINICAL DATA:  Patient fell in a parking lot, possible syncopal episode. The patient is shivering and the skin is hot to the touch. History of COPD-asthma, former smoker, myelodysplastic syndrome. EXAM: PORTABLE CHEST 1 VIEW COMPARISON:  PA and lateral chest x-ray of May 24, 2018. FINDINGS: The lungs are adequately inflated. The interstitial markings are coarse and more conspicuous than on the previous study. The heart is top-normal in size. The  pulmonary vascularity is not clearly engorged. There is calcification in the wall of the thoracic aorta. The right-sided PICC line tip terminates at the junction of the middle and distal thirds of the SVC. There is no pleural effusion. The observed bony thorax exhibits no acute abnormality. IMPRESSION: Mildly increased lung markings bilaterally may reflect interstitial edema of cardiac or noncardiac cause. Interstitial pneumonia is felt less likely. There is underlying COPD. Thoracic aortic atherosclerosis. Electronically Signed   By: David  Martinique M.D.   On: 07/07/2018 15:15    Labs:  CBC: Recent Labs    06/25/18 1219 06/30/18 1321 07/04/18 0812 07/07/18 1442  WBC 0.5* 0.5* 0.4* 0.4*  HGB 6.3* 6.6* 8.1* 7.4*  HCT 18.5* 19.4* 24.3* 22.8*  PLT 21* 19* 5* 16*    COAGS: Recent Labs    04/03/18 0802  INR 1.08    BMP: Recent Labs    05/25/18 0406 05/29/18 0619 06/01/18 0631 06/16/18 1238 07/07/18 1442  NA 138  --  142 132* 131*  K 3.3*  --  3.9 4.4 3.8  CL 100  --  106 98 98  CO2 26  --  '28 24 22    '$ GLUCOSE 111*  --  152* 104* 153*  BUN 12  --  15 40* 34*  CALCIUM 8.6*  --  8.8* 9.0 8.8*  CREATININE 0.69 0.72 0.55* 1.42* 1.58*  GFRNONAA >60 >60 >60 50* 44*  GFRAA >60 >60 >60 58* 51*    LIVER FUNCTION TESTS: Recent Labs    05/25/18 0406 06/01/18 0631 06/16/18 1238 07/07/18 1442  BILITOT 0.6 1.0 0.5 0.7  AST 15 14* 16 27  ALT '12 15 22 31  '$ ALKPHOS 65 84 142* 161*  PROT 7.2 7.2 8.1 8.0  ALBUMIN 2.2* 2.4* 2.2* 2.3*    TUMOR MARKERS: No results for input(s): AFPTM, CEA, CA199, CHROMGRNA in the last 8760 hours.  Assessment and Plan: Pancytopenia For CT guided bone marrow biopsy Plan for procedure tomorrow. Labs reviewed. NPO p MN Risks and benefits discussed with the patient including, but not limited to bleeding, infection, damage to adjacent structures or low yield requiring additional tests.  All of the patient's questions were answered, patient is agreeable to proceed. Consent signed and in chart.    Thank you for this interesting consult.  I greatly enjoyed meeting Andrew [name] and look forward to participating in their care.  A copy of this report was sent to the requesting provider on this date.  Electronically Signed: Ascencion Dike, PA-C 07/08/2018, 10:17 AM   I spent a total of 20 minutes in face to face in clinical consultation, greater than 50% of which was counseling/coordinating care for bone marrow biopsy

## 2018-07-08 NOTE — Progress Notes (Signed)
Pt refused before breakfast CBG. Educated Pt as to importance of CBG value. Pt continued to refuse. Will continue to monitor Pt.

## 2018-07-08 NOTE — Progress Notes (Signed)
PROGRESS NOTE                                                                                                                                                                                                             Patient Demographics:    Andrew [name], is a 67 y.o. male, DOB - 10-Jul-1951, XQJ:[id number]  Admit date - 07/07/2018   Admitting Physician Merton Border, MD  Outpatient Primary MD for the patient is [name], MD  LOS - 1  Chief Complaint  Patient presents with  . 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  He is followed by Dr. Betsy Coder, and has undergone 2 rounds of chemotherapy which have been ineffective.    Been struggling with rectal wounds for the last several weeks and is being treated outpatient by his PCP, he continues to be dependent on red cell and platelet transfusions.  He was admitted with sepsis due to a perianal abscess.   Subjective:    Andrew [name] today has, No headache, No chest pain, No abdominal pain - No Nausea, No new weakness tingling or numbness, No Cough - SOB.     Assessment  & Plan :     1.  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  General surgery on board, currently on broad-spectrum antibiotics which include IV vancomycin and Zosyn.  Follow cultures.  Clinically sepsis pathophysiology seems to have resolved but his condition remains tenuous due to underlying myelodysplasia and severe pancytopenia.  Monitor closely in stepdown.  2. 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 Annitta Jersey patient's oncologist he is on board, bone marrow biopsy has been ordered, platelets only if needed for surgery should be crossmatched, he has history of fevers with platelet transfusions.   Will defer management to heme-onc.  New prophylactic acyclovir.  3.  Hypertension.  Currently on beta-blocker and stable.  4.  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  5.  GERD.  On PPI.  6.  COPD.  At baseline no acute issues.    7. DM type II.  On sliding scale we will continue to monitor.  Lab Results  Component Value Date   HGBA1C 7.3 (H) 04/02/2018   CBG (last 3)  Recent Labs    07/07/18 2059  GLUCAP 117*     Family Communication  :  None  Code Status :  Full  Disposition Plan  :  Stepdown  Consults  :  Haem, CCS  Procedures  :    Bone marrow biospy -   DVT Prophylaxis  :    SCDs    Lab Results  Component Value Date   PLT 11 (LL) 07/08/2018    Diet :  Diet Order            Diet NPO time specified  Diet effective midnight        Diet regular Room service appropriate? Yes; Fluid consistency: Thin  Diet effective now               Inpatient Medications Scheduled Meds: . sodium chloride   Intravenous Once  . acyclovir  400 mg Oral BID  . alteplase  2 mg Intracatheter Once  . chlorhexidine  15 mL Mouth/Throat BID  . dorzolamide-timolol  1 drop Both Eyes BID  . feeding supplement (ENSURE ENLIVE)  237 mL Oral BID BM  . gemfibrozil  600 mg Oral BID  . Influenza vac split quadrivalent PF  0.5 mL Intramuscular Tomorrow-1000  . insulin aspart  0-5 Units Subcutaneous QHS  . insulin aspart  0-9 Units Subcutaneous TID WC  . ipratropium-albuterol  3 mL Nebulization BID  . latanoprost  1 drop Both Eyes QHS  . metoprolol tartrate  25 mg Oral BID  . pantoprazole  40 mg Oral BID  . pneumococcal 23 valent vaccine  0.5 mL Intramuscular Tomorrow-1000   Continuous Infusions: . piperacillin-tazobactam (ZOSYN)  IV 3.375 g (07/08/18 1010)  . sodium chloride    . vancomycin 500 mg (07/08/18 1141)   PRN Meds:.acetaminophen, morphine injection, ondansetron **OR** ondansetron (ZOFRAN) IV, sodium chloride flush, traMADol  Antibiotics  :   Anti-infectives (From  admission, onward)   Start     Dose/Rate Route Frequency Ordered Stop   07/08/18 0900  piperacillin-tazobactam (ZOSYN) IVPB 3.375 g     3.375 g 12.5 mL/hr over 240 Minutes Intravenous Every 8 hours 07/07/18 1737     07/08/18 0800  vancomycin (VANCOCIN) 500 mg in sodium chloride 0.9 % 100 mL IVPB     500 mg 100 mL/hr over 60 Minutes Intravenous Every 12 hours 07/07/18 1737     07/08/18 0100  piperacillin-tazobactam (ZOSYN) IVPB 3.375 g     3.375 g 100 mL/hr over 30 Minutes Intravenous  Once 07/07/18 1729 07/08/18 0153   07/07/18 2200  acyclovir (ZOVIRAX) tablet 400 mg     400 mg Oral 2 times daily 07/07/18 1930     07/07/18 1945  fluconazole (DIFLUCAN) tablet 50 mg  Status:  Discontinued     50 mg Oral Daily 07/07/18 1930 07/07/18 1950   07/07/18 1945  metroNIDAZOLE (FLAGYL) IVPB 500 mg  Status:  Discontinued     500 mg 100 mL/hr over 60 Minutes Intravenous Every 8 hours 07/07/18 1930 07/07/18 1952   07/07/18 1445  metroNIDAZOLE (FLAGYL) IVPB 500 mg  Status:  Discontinued     500 mg 100 mL/hr over 60 Minutes Intravenous Every 8 hours 07/07/18 1430 07/07/18 1734   07/07/18 1445  ceFEPIme (MAXIPIME) 2 g in sodium chloride 0.9 % 100 mL IVPB     2 g 200 mL/hr over 30 Minutes Intravenous  Once 07/07/18 1442 07/07/18 1622   07/07/18 1445  vancomycin (VANCOCIN) 1,500 mg in sodium chloride 0.9 % 500 mL IVPB     1,500 mg 250 mL/hr over 120 Minutes Intravenous  Once 07/07/18 1442 07/07/18  2008          Objective:   Vitals:   07/08/18 0417 07/08/18 0451 07/08/18 0634 07/08/18 0821  BP: 120/84 (!) 90/49 (!) 111/56   Pulse: 70 66 79   Resp: _0 Temp: 99 F (37.2 C) 98.1 F (36.7 C) 97.8 F (36.6 C)   TempSrc: Oral Oral Oral   SpO2: 100% 100% 100% 95%  Weight:      Height:        Wt Readings from Last 3 Encounters:  07/07/18 66.5 kg  07/04/18 65.5 kg  06/25/18 70 kg     Intake/Output Summary (Last 24 hours) at 07/08/2018 1155 Last data filed at 07/08/2018 1030 Gross  per 24 hour  Intake 866 ml  Output 1290 ml  Net -424 ml     Physical Exam  Awake Alert, Oriented X 3, No new F.N deficits, Normal affect [place].AT,PERRAL Supple Neck,No JVD, No cervical lymphadenopathy appriciated.  Symmetrical Chest wall movement, Good air movement bilaterally, CTAB RRR,No Gallops,Rubs or new Murmurs, No Parasternal Heave +ve B.Sounds, Abd Soft, No tenderness, No organomegaly appriciated, No rebound - guarding or rigidity. No Cyanosis, Clubbing or edema, No new Rash or bruise    Data Review:    CBC Recent Labs  Lab 07/04/18 0812 07/07/18 1442 07/08/18 0937  WBC 0.4* 0.4* 0.3*  HGB 8.1* 7.4* 8.1*  HCT 24.3* 22.8* 24.5*  PLT 5* 16* 11*  MCV 89.3 89.4 86.6  MCH 29.8 29.0 28.6  MCHC 33.3 32.5 33.1  RDW 12.8 12.1 14.0  LYMPHSABS 0.3* 0.3*  --   MONOABS 0.0* 0.0*  --   EOSABS 0.0 0.0  --   BASOSABS 0.0 0.0  --     Chemistries  Recent Labs  Lab 07/07/18 1442 07/08/18 0937  NA 131* 135  K 3.8 4.0  CL 98 103  CO2 22 23  GLUCOSE 153* 124*  BUN 34* 20  CREATININE 1.58* 0.84  CALCIUM 8.8* 8.8*  MG  --  2.0  AST 27 14*  ALT 31 24  ALKPHOS 161* 139*  BILITOT 0.7 2.0*   ------------------------------------------------------------------------------------------------------------------ No results for input(s): CHOL, HDL, LDLCALC, TRIG, CHOLHDL, LDLDIRECT in the last 72 hours.  Lab Results  Component Value Date   HGBA1C 7.3 (H) 04/02/2018   ------------------------------------------------------------------------------------------------------------------ No results for input(s): TSH, T4TOTAL, T3FREE, THYROIDAB in the last 72 hours.  Invalid input(s): FREET3 ------------------------------------------------------------------------------------------------------------------ No results for input(s): VITAMINB12, FOLATE, FERRITIN, TIBC, IRON, RETICCTPCT in the last 72 hours.  Coagulation profile No results for input(s): INR, PROTIME in the last 168  hours.  No results for input(s): DDIMER in the last 72 hours.  Cardiac Enzymes Recent Labs  Lab 07/07/18 1955 07/08/18 0937  TROPONINI <0.03 <0.03   ------------------------------------------------------------------------------------------------------------------ No results found for: BNP  Micro Results Recent Results (from the past 240 hour(s))  Urine culture     Status: None   Collection Time: 07/07/18  3:42 PM  Result Value Ref Range Status   Specimen Description URINE, CLEAN CATCH  Final   Special Requests NONE  Final   Culture   Final    NO GROWTH Performed at [facility], [place] [address], [place], [place] [address]    Report Status 07/08/2018 FINAL  Final    Radiology Reports Ct Abdomen Pelvis W Contrast  Result Date: 07/07/2018 CLINICAL DATA:  Recent onset of weakness. Opened wound of the anus. History of COPD, diabetes, hypertension and myelodysplasia. EXAM: CT ABDOMEN AND PELVIS WITH CONTRAST  TECHNIQUE: Multidetector CT imaging of the abdomen and pelvis was performed using the standard protocol following bolus administration of intravenous contrast. CONTRAST:  141m OMNIPAQUE IOHEXOL 300 MG/ML  SOLN COMPARISON:  Abdominopelvic CT 04/02/2018. FINDINGS: Lower chest: Images through the lung bases are degraded by breathing artifact. Subpleural right lower lobe nodule measuring 7 mm on image 20/4 similar to the previous study. There is a new 7 mm right lower lobe nodule on image 20/4 as well as a new 6 mm right lower lobe nodule on image 11/4. The heart is enlarged. No significant pleural or pericardial effusion. Hepatobiliary: The liver is normal in density without focal abnormality. No evidence of gallstones, gallbladder wall thickening or biliary dilatation. Pancreas: Unremarkable. No pancreatic ductal dilatation or surrounding inflammatory changes. Spleen: Normal in size without focal abnormality. Adrenals/Urinary Tract: Both adrenal glands appear normal. There is a  stable tiny nonobstructing calculus in the lower pole of the left kidney, best seen on sagittal image 75/8. No evidence of ureteral calculus, hydronephrosis or perinephric soft tissue stranding. Probable tiny cyst anteriorly in the interpolar region of the left kidney. The bladder appears unremarkable. Stomach/Bowel: No evidence of bowel wall thickening, distention or surrounding inflammatory change. The appendix appears normal. There is moderate stool throughout the colon. There is a perianal air-fluid collection measuring up to 3.4 x 1.9 cm on axial image 100/3, likely reflecting a fistula and abscess. This is best demonstrated on sagittal images 55 through 60/8 and demonstrates surrounding enhancement. No evidence of intrapelvic abscess. Vascular/Lymphatic: There are no enlarged abdominal or pelvic lymph nodes. Small inguinal lymph nodes are likely reactive. Moderate aortic and branch vessel atherosclerosis. Reproductive: Stable mild enlargement of the prostate gland. Other: Small umbilical hernia containing only fat. No ascites, free air or intra-abdominal abscess. Musculoskeletal: No acute or significant osseous findings. IMPRESSION: 1. New perianal air-fluid collection with surrounding enhancement consistent with fistula and perianal abscess. 2. No evidence of bowel obstruction or intra-abdominal abscess. 3. Increased nodularity at the right lung base since recent CT of 3 months ago, possibly inflammatory. Follow-up chest CT in 3-6 months recommended to exclude the possibility of metastatic disease. 4.  Aortic Atherosclerosis (ICD10-I70.0). Electronically Signed   By: WRichardean SaleM.D.   On: 07/07/2018 19:31   Dg Chest Portable 1 View  Result Date: 07/07/2018 CLINICAL DATA:  Patient fell in a parking lot, possible syncopal episode. The patient is shivering and the skin is hot to the touch. History of COPD-asthma, former smoker, myelodysplastic syndrome. EXAM: PORTABLE CHEST 1 VIEW COMPARISON:  PA and  lateral chest x-ray of May 24, 2018. FINDINGS: The lungs are adequately inflated. The interstitial markings are coarse and more conspicuous than on the previous study. The heart is top-normal in size. The pulmonary vascularity is not clearly engorged. There is calcification in the wall of the thoracic aorta. The right-sided PICC line tip terminates at the junction of the middle and distal thirds of the SVC. There is no pleural effusion. The observed bony thorax exhibits no acute abnormality. IMPRESSION: Mildly increased lung markings bilaterally may reflect interstitial edema of cardiac or noncardiac cause. Interstitial pneumonia is felt less likely. There is underlying COPD. Thoracic aortic atherosclerosis. Electronically Signed   By: David  JMartiniqueM.D.   On: 07/07/2018 15:15    Time Spent in minutes  30   PLala LundM.D on 07/08/2018 at 11:55 AM  To page go to [email or web address] - password [email or web address]

## 2018-07-08 NOTE — Progress Notes (Signed)
Initial Nutrition Assessment  DOCUMENTATION CODES:   Non-severe (moderate) malnutrition in context of chronic illness  INTERVENTION:   - Provided high-kcal, high-protein education (pt declined handout)  - Ensure Enlive po TID, each supplement provides 350 kcal and 20 grams of protein (any flavor)  - Encourage adequate PO intake  NUTRITION DIAGNOSIS:   Moderate Malnutrition related to chronic illness (COPD, myelodysplasia currently undergoing chemotherapy) as evidenced by moderate fat depletion, moderate muscle depletion, percent weight loss (17.2% weight loss in 3 months).  GOAL:   Patient will meet greater than or equal to 90% of their needs  MONITOR:   PO intake, Supplement acceptance, Labs, I & O's, Weight trends  REASON FOR ASSESSMENT:   Malnutrition Screening Tool    ASSESSMENT:   67 year old male who presented to the ED on 10/14 after an episode of weakness and subsequent fall. PMH significant for COPD, DM, hypertension, anemia, myelodysplasia, and anemia. Pt admitted with fever and perianal abscess.  Per Oncology note, pt has completed 2 cycles of 5 azacytidine therapy without improvement and remains dependent on red cell and platelet transfusions.  Noted plan for CT guided bone marrow biopsy tomorrow, 10/16.  Per Surgery note, pt with likely fistula.  Spoke with pt at bedside. Pt reports that he has been losing weight since August 2019 and doesn't know why. Pt reports his UBW as 175-180 lbs. Pt notes that his appetite has been poor and he has been eating less since Feburary 2019 after he had 3 family members pass away.  Noted pt with 30 lb weight loss over the past 3 months. This is a 17.2% weight loss which is severe and significant for timeframe.  Pt's diet recall was unclear. Pt offered several different answers to RD questions when RD attempting to elicit more information. Pt states he has been eating poorly over the past several months but also reports eating  3 meals daily. Pt reports that a typical day includes the following:  Breakfast: country ham, grits, tomato, lettuce, hashbrowns, wheat toast Lunch: liver, green beans or salmon cakes or beef ribs Dinner: fish or similar to lunch  Pt shares that he drinks 1 Ensure supplement daily ("6 per week") but is willing to receiving Ensure Enlive TID during admission. Pt has no flavor preference. Pt states, "I'll do whatever it takes!"  Noted pt with untouched food meal tray at bedside. Pt had consumed 100% of both juices. Pt states, "its too early" and that he normally eats lunch between 2:00-3:00 pm. RD encouraged PO intake and provided education on ways to maximize kcal and protein intake to prevent further weight loss and lean muscle mass wasting. Pt expressed appreciation and understanding.  Wt Readings from Last 15 Encounters:  07/07/18 66.5 kg  07/04/18 65.5 kg  06/25/18 70 kg  06/23/18 70 kg  06/16/18 70.5 kg  06/11/18 70.3 kg  05/27/18 75.4 kg  05/20/18 75.4 kg  05/15/18 76.9 kg  05/06/18 78.6 kg  04/21/18 79.7 kg  04/10/18 80.2 kg  04/04/18 80.3 kg  07/29/12 92.1 kg   Medications reviewed and include: Ensure Enlive BID - pt refusing, sliding scale Novolog, Protonix 40 mg BID  Labs reviewed: WBC 0.3 (L), RBC 2.82 (L), hemoglobin 8.1 (L), HCT 24.5 (L), platelets 11 (L) CBG's: 112, 117  NUTRITION - FOCUSED PHYSICAL EXAM:    Most Recent Value  Orbital Region  Moderate depletion  Upper Arm Region  Mild depletion  Thoracic and Lumbar Region  Moderate depletion  Buccal Region  Moderate  depletion  Temple Region  Moderate depletion  Clavicle Bone Region  Moderate depletion  Clavicle and Acromion Bone Region  Moderate depletion  Scapular Bone Region  Mild depletion  Dorsal Hand  Mild depletion  Patellar Region  Moderate depletion  Anterior Thigh Region  Moderate depletion  Posterior Calf Region  Moderate depletion  Edema (RD Assessment)  None  Hair  Reviewed  Eyes  Reviewed   Mouth  Reviewed  Skin  Reviewed  Nails  Reviewed       Diet Order:   Diet Order            Diet NPO time specified  Diet effective midnight        Diet regular Room service appropriate? Yes; Fluid consistency: Thin  Diet effective now              EDUCATION NEEDS:   Education needs have been addressed  Skin:  Skin Assessment: Skin Integrity Issues: Unstageable: right buttocks  Last BM:  unknown/PTA  Height:   Ht Readings from Last 1 Encounters:  07/07/18 _0  (1.702 m)    Weight:   Wt Readings from Last 1 Encounters:  07/07/18 66.5 kg    Ideal Body Weight:  67.27 kg  BMI:  Body mass index is 22.96 kg/m.  Estimated Nutritional Needs:   Kcal:  2100-2300  Protein:  95-110 grams  Fluid:  >/= 2.0 L    Gaynell Face, MS, RD, LDN Inpatient Clinical Dietitian Pager: [phone] Weekend/After Hours: [phone]

## 2018-07-08 NOTE — Progress Notes (Signed)
Patient asked about plan of care; patient is requesting to receive platelets and complete biopsy tomorrow. Patient refusing surgery, after discussion with the surgery. Paged Lamar Blinks, NP for clarification on platelet orders. Awaiting  Schorr, NP to review chart, orders to be discontinued and placed for platelets.

## 2018-07-08 NOTE — Consult Note (Addendum)
Reason for Consult: Perianal abscess Referring Physician: Dr. Hayden Pedro CC:  Sepsis   Andrew [name] is an 67 y.o. male.   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  He is followed by Dr. Betsy Coder, and has undergone 2 rounds of chemotherapy which have been ineffective.  He continues to be dependent on red cell and platelet transfusions.  He was admitted with fever and a perianal abscess by Medicine.  Patient presented to the ED yesterday with a one-week history of fever weakness and becoming progressively worse.  He has had a right buttocks perianal abscess that has been treated by his primary care physician for the last 3 weeks with open sites packed wet to dry.  It sounds like he has been taking Sitz baths at home.    Yesterday he had a fall while at the [employer] and was found to be febrile and extremely weak.  Work-up in the ED: On admission his fever was 103.  A repeat temperature 1 hour later was 102.8.  He became mildly hypotensive.  Labs shows a sodium of 131, glucose of 153, BUN 34, creatinine 1.58, alk phos 161.  LFTs were normal.  Troponin was less than 0.03.  Lactate was 1.0.  WBC 0.4.  Hemoglobin 7.4, hematocrit 22.8, platelets were 16,000. Repeat CBC this a.m. shows WBC 0.3.  Hemoglobin 8.1, hematocrit 24.5, platelets 11,000.  Admission urinalysis was normal.  Urine and blood cultures are pending.  Admission chest x-ray shows mildly decreased lung markings bilaterally.  Possible interstitial edema or cardiac noncardiac cause.  Underlying COPD. CT of the abdomen and pelvis with contrast: There is a perianal air-fluid collection measuring 3.4 x 1.9 likely reflecting a fistula and abscess, no evidence of intrapelvic abscess.  Nonobstructing left renal calculus.  Patient is currently on acyclovir, he has had a dose of Maxipime, Flagyl, and has been converted over to Zosyn.  He also has orders to start vancomycin 12  today. WE are ask to see.       Past Medical History:  Diagnosis Date  . Abdominal bloating    probable lactose intolorence  . Anemia 04/03/2018  . Asthma   . COPD (chronic obstructive pulmonary disease) (HCC)    mild improvement  . DM (diabetes mellitus) ([place])   . Hearing impairment    asymptomatic  . HTN (hypertension)   . Tobacco use   . Vitamin D deficiency     Past Surgical History:  Procedure Laterality Date  . BIOPSY  04/03/2018   Procedure: BIOPSY;  Surgeon: Otis Brace, MD;  Location: [facility];  Service: Gastroenterology;;  . COLONOSCOPY WITH PROPOFOL N/A 04/04/2018   Procedure: COLONOSCOPY WITH PROPOFOL;  Surgeon: Otis Brace, MD;  Location: [facility];  Service: Gastroenterology;  Laterality: N/A;  . ESOPHAGOGASTRODUODENOSCOPY (EGD) WITH PROPOFOL N/A 04/03/2018   Procedure: ESOPHAGOGASTRODUODENOSCOPY (EGD) WITH PROPOFOL;  Surgeon: Otis Brace, MD;  Location[facility];  Service: Gastroenterology;  Laterality: N/A;  . EYE SURGERY      History reviewed. No pertinent family history.  Social History:  reports that he quit smoking about 4 months ago. His smoking use included cigarettes. He smoked 0.25 packs per day. He has never used smokeless tobacco. He reports that he drinks alcohol. He reports that he does not use drugs.  Allergies:  Allergies  Allergen Reactions  . Shellfish Allergy Hives    " I BREAK OUT IN HIVES "    Medications:  Prior to  Admission:  Medications Prior to Admission  Medication Sig Dispense Refill Last Dose  . acyclovir (ZOVIRAX) 400 MG tablet Take 1 tablet (400 mg total) by mouth 2 (two) times daily. 60 tablet 2 07/07/2018 at AM  . benazepril (LOTENSIN) 40 MG tablet Take 40 mg by mouth daily.  6 07/07/2018 at Unknown time  . chlorhexidine (PERIDEX) 0.12 % solution Use as directed 15 mLs in the mouth or throat 2 (two) times daily. 473 mL 2 07/07/2018 at Unknown time  . dorzolamide-timolol (COSOPT) 22.3-6.8 MG/ML  ophthalmic solution Place 1 drop into both eyes 2 (two) times daily.  3 07/07/2018 at Unknown time  . fluconazole (DIFLUCAN) 50 MG tablet Take 1 tablet (50 mg total) by mouth daily. 30 tablet 1 Past Month at Unknown time  . gemfibrozil (LOPID) 600 MG tablet Take 600 mg by mouth 2 (two) times daily.  6 07/07/2018 at AM  . levofloxacin (LEVAQUIN) 500 MG tablet Take 1 tablet (500 mg total) by mouth daily. 30 tablet 1 07/07/2018 at Unknown time  . metoprolol tartrate (LOPRESSOR) 25 MG tablet Take 25 mg by mouth 2 (two) times daily.    07/07/2018 at 0800  . montelukast (SINGULAIR) 10 MG tablet Take 10 mg by mouth daily.   1 07/07/2018 at Unknown time  . prochlorperazine (COMPAZINE) 5 MG tablet TAKE 1 TABLET(5 MG) BY MOUTH EVERY 6 HOURS AS NEEDED FOR NAUSEA OR VOMITING (Patient taking differently: Take 5 mg by mouth every 6 (six) hours as needed for nausea or vomiting. ) 30 tablet 0 unk  . sorbitol 70 % solution Take 15 mLs by mouth daily as needed. (Patient taking differently: Take 15 mLs by mouth daily as needed (constipation). ) 473 mL 0 Past Month at Unknown time  . tetrahydrozoline-zinc (VISINE-AC) 0.05-0.25 % ophthalmic solution Place 2 drops into both eyes as needed (redness).    unk  . traMADol (ULTRAM) 50 MG tablet Take 1 tablet (50 mg total) by mouth every 6 (six) hours as needed. 30 tablet 0 07/07/2018 at Unknown time  . TRAVATAN Z 0.004 % SOLN ophthalmic solution Place 1 drop into both eyes every evening.  4 07/06/2018 at Unknown time  . triamterene-hydrochlorothiazide (MAXZIDE-25) 37.5-25 MG per tablet Take 1 tablet by mouth daily.   07/07/2018 at Unknown time  . VENTOLIN HFA 108 (90 Base) MCG/ACT inhaler Inhale 1-2 puffs into the lungs 4 (four) times daily as needed.  3 unk  . pantoprazole (PROTONIX) 40 MG tablet Take 1 tablet (40 mg total) by mouth 2 (two) times daily. (Patient not taking: Reported on 07/07/2018) 60 tablet 0 Not Taking at Unknown time   Scheduled: . sodium chloride    Intravenous Once  . acyclovir  400 mg Oral BID  . alteplase  2 mg Intracatheter Once  . chlorhexidine  15 mL Mouth/Throat BID  . dorzolamide-timolol  1 drop Both Eyes BID  . feeding supplement (ENSURE ENLIVE)  237 mL Oral BID BM  . gemfibrozil  600 mg Oral BID  . Influenza vac split quadrivalent PF  0.5 mL Intramuscular Tomorrow-1000  . insulin aspart  0-5 Units Subcutaneous QHS  . insulin aspart  0-9 Units Subcutaneous TID WC  . ipratropium-albuterol  3 mL Nebulization BID  . latanoprost  1 drop Both Eyes QHS  . metoprolol tartrate  25 mg Oral BID  . pantoprazole  40 mg Oral BID  . pneumococcal 23 valent vaccine  0.5 mL Intramuscular Tomorrow-1000   Continuous: . piperacillin-tazobactam (ZOSYN)  IV 3.375 g (  07/08/18 1010)  . sodium chloride    . vancomycin     Anti-infectives (From admission, onward)   Start     Dose/Rate Route Frequency Ordered Stop   07/08/18 0900  piperacillin-tazobactam (ZOSYN) IVPB 3.375 g     3.375 g 12.5 mL/hr over 240 Minutes Intravenous Every 8 hours 07/07/18 1737     07/08/18 0800  vancomycin (VANCOCIN) 500 mg in sodium chloride 0.9 % 100 mL IVPB     500 mg 100 mL/hr over 60 Minutes Intravenous Every 12 hours 07/07/18 1737     07/08/18 0100  piperacillin-tazobactam (ZOSYN) IVPB 3.375 g     3.375 g 100 mL/hr over 30 Minutes Intravenous  Once 07/07/18 1729 07/08/18 0153   07/07/18 2200  acyclovir (ZOVIRAX) tablet 400 mg     400 mg Oral 2 times daily 07/07/18 1930     07/07/18 1945  fluconazole (DIFLUCAN) tablet 50 mg  Status:  Discontinued     50 mg Oral Daily 07/07/18 1930 07/07/18 1950   07/07/18 1945  metroNIDAZOLE (FLAGYL) IVPB 500 mg  Status:  Discontinued     500 mg 100 mL/hr over 60 Minutes Intravenous Every 8 hours 07/07/18 1930 07/07/18 1952   07/07/18 1445  metroNIDAZOLE (FLAGYL) IVPB 500 mg  Status:  Discontinued     500 mg 100 mL/hr over 60 Minutes Intravenous Every 8 hours 07/07/18 1430 07/07/18 1734   07/07/18 1445  ceFEPIme (MAXIPIME)  2 g in sodium chloride 0.9 % 100 mL IVPB     2 g 200 mL/hr over 30 Minutes Intravenous  Once 07/07/18 1442 07/07/18 1622   07/07/18 1445  vancomycin (VANCOCIN) 1,500 mg in sodium chloride 0.9 % 500 mL IVPB     1,500 mg 250 mL/hr over 120 Minutes Intravenous  Once 07/07/18 1442 07/07/18 2008      Results for orders placed or performed during the hospital encounter of 07/07/18 (from the past 48 hour(s))  Comprehensive metabolic panel     Status: Abnormal   Collection Time: 07/07/18  2:42 PM  Result Value Ref Range   Sodium 131 (L) 135 - 145 mmol/L   Potassium 3.8 3.5 - 5.1 mmol/L   Chloride 98 98 - 111 mmol/L   CO2 22 22 - 32 mmol/L   Glucose, Bld 153 (H) 70 - 99 mg/dL   BUN 34 (H) 8 - 23 mg/dL   Creatinine, Ser 1.58 (H) 0.61 - 1.24 mg/dL   Calcium 8.8 (L) 8.9 - 10.3 mg/dL   Total Protein 8.0 6.5 - 8.1 g/dL   Albumin 2.3 (L) 3.5 - 5.0 g/dL   AST 27 15 - 41 U/L   ALT 31 0 - 44 U/L   Alkaline Phosphatase 161 (H) 38 - 126 U/L   Total Bilirubin 0.7 0.3 - 1.2 mg/dL   GFR calc non Af Amer 44 (L) >60 mL/min   GFR calc Af Amer 51 (L) >60 mL/min    Comment: (NOTE) The eGFR has been calculated using the CKD EPI equation. This calculation has not been validated in all clinical situations. eGFR's persistently <60 mL/min signify possible Chronic Kidney Disease.    Anion gap 11 5 - 15    Comment: Performed at [facility] [address], [place],  [address]  CBC WITH DIFFERENTIAL     Status: Abnormal   Collection Time: 07/07/18  2:42 PM  Result Value Ref Range   WBC 0.4 (LL) 4.0 - 10.5 K/uL    Comment: REPEATED TO VERIFY  THIS RESULT HAS BEEN CALLED TO K. FIELDS RN BY RASHEEDA GREEN ON 10 14 2019 AT 10, AND HAS BEEN READ BACK. RESULTS VERIFIED    RBC 2.55 (L) 4.22 - 5.81 MIL/uL   Hemoglobin 7.4 (L) 13.0 - 17.0 g/dL    Comment: REPEATED TO VERIFY   HCT 22.8 (L) 39.0 - 52.0 %   MCV 89.4 80.0 - 100.0 fL   MCH 29.0 26.0 - 34.0 pg   MCHC 32.5 30.0 - 36.0 g/dL   RDW 12.1  11.5 - 15.5 %   Platelets 16 (LL) 150 - 400 K/uL    Comment: Immature Platelet Fraction may be clinically indicated, consider ordering this additional test [id number] THIS RESULT HAS BEEN CALLED TO K. FIELDS RN BY RASHEEDA GREEN ON 10 14 2019 AT 1552, AND HAS BEEN READ BACK. CRITICAL VALUE VERIFIED REPEATED TO VERIFY PLATELET COUNT CONFIRMED BY SMEAR CORRECTED ON 10/14 AT 2033: PREVIOUSLY REPORTED AS 16 Immature Platelet Fraction may be clinically indicated, consider ordering this additional test [id number] THIS RESULT HAS BEEN CALLED TO K. FIELDS RN BY RASHEEDA GREEN ON 10 14 2019 AT 1552, AND HAS  BEEN READ BACK. CRITICAL VALUE VERIFIED    nRBC 0.0 0.0 - 0.2 %   Neutrophils Relative % 8 %   Neutro Abs 0.0 (L) 1.7 - 7.7 K/uL    Comment: This result has been called to K. FIELDS RN by Charlotte Crumb on 10 14 2019 at 1647, and has been read back. critical result verified   Lymphocytes Relative 78 %   Lymphs Abs 0.3 (L) 0.7 - 4.0 K/uL   Monocytes Relative 8 %   Monocytes Absolute 0.0 (L) 0.1 - 1.0 K/uL   Eosinophils Relative 3 %   Eosinophils Absolute 0.0 0.0 - 0.5 K/uL   Basophils Relative 3 %   Basophils Absolute 0.0 0.0 - 0.1 K/uL   Immature Granulocytes 0 %   Abs Immature Granulocytes 0.00 0.00 - 0.07 K/uL   Abnormal Lymphocytes Present PRESENT     Comment: Performed at [facility] [address] [place], [place] [address]  I-Stat CG4 Lactic Acid, ED  (not at  [facility])     Status: None   Collection Time: 07/07/18  2:55 PM  Result Value Ref Range   Lactic Acid, Venous 1.00 0.5 - 1.9 mmol/L  Urinalysis, Routine w reflex microscopic     Status: Abnormal   Collection Time: 07/07/18  3:56 PM  Result Value Ref Range   Color, Urine AMBER (A) YELLOW    Comment: BIOCHEMICALS MAY BE AFFECTED BY COLOR   APPearance HAZY (A) CLEAR   Specific Gravity, Urine 1.019 1.005 - 1.030   pH 5.0 5.0 - 8.0   Glucose, UA NEGATIVE NEGATIVE mg/dL   Hgb urine dipstick NEGATIVE NEGATIVE   Bilirubin  Urine NEGATIVE NEGATIVE   Ketones, ur NEGATIVE NEGATIVE mg/dL   Protein, ur 30 (A) NEGATIVE mg/dL   Nitrite NEGATIVE NEGATIVE   Leukocytes, UA NEGATIVE NEGATIVE   RBC / HPF 0-5 0 - 5 RBC/hpf   WBC, UA 0-5 0 - 5 WBC/hpf   Bacteria, UA RARE (A) NONE SEEN   Squamous Epithelial / LPF 0-5 0 - 5   Mucus PRESENT    Hyaline Casts, UA PRESENT     Comment: Performed at [facility], [address] [place], [place] [address]  Type and screen [place]     Status: None (Preliminary result)   Collection Time: 07/07/18  5:00  PM  Result Value Ref Range   ABO/RH(D) O POS    Antibody Screen NEG    Sample Expiration 07/10/2018    Unit Number [id number]    Blood Component Type RCLI PHER 2    Unit division 00    Status of Unit ISSUED    Transfusion Status OK TO TRANSFUSE    Crossmatch Result Compatible    Unit Number [id number]    Blood Component Type RBC, LR IRR    Unit division 00    Status of Unit ISSUED    Transfusion Status OK TO TRANSFUSE    Crossmatch Result      Compatible Performed at [facility], [place] [address] [place], [place] [address]   Prepare RBC     Status: None   Collection Time: 07/07/18  5:00 PM  Result Value Ref Range   Order Confirmation      ORDER PROCESSED BY BLOOD BANK Performed at [facility], [place] [address], [place], [place] [address]   Prepare Pheresed Platelets     Status: None   Collection Time: 07/07/18  5:01 PM  Result Value Ref Range   Unit Number [id number]    Blood Component Type PLTP LR3 PAS    Unit division 00    Status of Unit REL FROM [facility]    Transfusion Status      OK TO TRANSFUSE Performed at [facility], [place] [address], [place], [place] [address]   Troponin I     Status: None   Collection Time: 07/07/18  7:55 PM  Result Value Ref Range   Troponin I <0.03 <0.03 ng/mL    Comment: Performed at [facility] [address], [place], [place] [address]  Lactic acid, plasma      Status: None   Collection Time: 07/07/18  7:55 PM  Result Value Ref Range   Lactic Acid, Venous 1.0 0.5 - 1.9 mmol/L    Comment: Performed at [facility] [address], [place], [place] [address]  Glucose, capillary     Status: Abnormal   Collection Time: 07/07/18  8:59 PM  Result Value Ref Range   Glucose-Capillary 117 (H) 70 - 99 mg/dL  Influenza panel by PCR (type A & B)     Status: None   Collection Time: 07/07/18 11:17 PM  Result Value Ref Range   Influenza A By PCR NEGATIVE NEGATIVE   Influenza B By PCR NEGATIVE NEGATIVE    Comment: (NOTE) The Xpert Xpress Flu assay is intended as an aid in the diagnosis of  influenza and should not be used as a sole basis for treatment.  This  assay is FDA approved for nasopharyngeal swab specimens only. Nasal  washings and aspirates are unacceptable for Xpert Xpress Flu testing. Performed at [facility], [place] [address], [place], [place] [address]     Ct Abdomen Pelvis W Contrast  Result Date: 07/07/2018 CLINICAL DATA:  Recent onset of weakness. Opened wound of the anus. History of COPD, diabetes, hypertension and myelodysplasia. EXAM: CT ABDOMEN AND PELVIS WITH CONTRAST TECHNIQUE: Multidetector CT imaging of the abdomen and pelvis was performed using the standard protocol following bolus administration of intravenous contrast. CONTRAST:  179m OMNIPAQUE IOHEXOL 300 MG/ML  SOLN COMPARISON:  Abdominopelvic CT 04/02/2018. FINDINGS: Lower chest: Images through the lung bases are degraded by breathing artifact. Subpleural right lower lobe nodule measuring 7 mm on image 20/4 similar to the previous study. There is  a new 7 mm right lower lobe nodule on image 20/4 as well as a new 6 mm right lower lobe nodule on image 11/4. The heart is enlarged. No significant pleural or pericardial effusion. Hepatobiliary: The liver is normal in density without focal abnormality. No evidence of gallstones, gallbladder wall thickening or biliary  dilatation. Pancreas: Unremarkable. No pancreatic ductal dilatation or surrounding inflammatory changes. Spleen: Normal in size without focal abnormality. Adrenals/Urinary Tract: Both adrenal glands appear normal. There is a stable tiny nonobstructing calculus in the lower pole of the left kidney, best seen on sagittal image 75/8. No evidence of ureteral calculus, hydronephrosis or perinephric soft tissue stranding. Probable tiny cyst anteriorly in the interpolar region of the left kidney. The bladder appears unremarkable. Stomach/Bowel: No evidence of bowel wall thickening, distention or surrounding inflammatory change. The appendix appears normal. There is moderate stool throughout the colon. There is a perianal air-fluid collection measuring up to 3.4 x 1.9 cm on axial image 100/3, likely reflecting a fistula and abscess. This is best demonstrated on sagittal images 55 through 60/8 and demonstrates surrounding enhancement. No evidence of intrapelvic abscess. Vascular/Lymphatic: There are no enlarged abdominal or pelvic lymph nodes. Small inguinal lymph nodes are likely reactive. Moderate aortic and branch vessel atherosclerosis. Reproductive: Stable mild enlargement of the prostate gland. Other: Small umbilical hernia containing only fat. No ascites, free air or intra-abdominal abscess. Musculoskeletal: No acute or significant osseous findings. IMPRESSION: 1. New perianal air-fluid collection with surrounding enhancement consistent with fistula and perianal abscess. 2. No evidence of bowel obstruction or intra-abdominal abscess. 3. Increased nodularity at the right lung base since recent CT of 3 months ago, possibly inflammatory. Follow-up chest CT in 3-6 months recommended to exclude the possibility of metastatic disease. 4.  Aortic Atherosclerosis (ICD10-I70.0). Electronically Signed   By: Richardean Sale M.D.   On: 07/07/2018 19:31   Dg Chest Portable 1 View  Result Date: 07/07/2018 CLINICAL DATA:   Patient fell in a parking lot, possible syncopal episode. The patient is shivering and the skin is hot to the touch. History of COPD-asthma, former smoker, myelodysplastic syndrome. EXAM: PORTABLE CHEST 1 VIEW COMPARISON:  PA and lateral chest x-ray of May 24, 2018. FINDINGS: The lungs are adequately inflated. The interstitial markings are coarse and more conspicuous than on the previous study. The heart is top-normal in size. The pulmonary vascularity is not clearly engorged. There is calcification in the wall of the thoracic aorta. The right-sided PICC line tip terminates at the junction of the middle and distal thirds of the SVC. There is no pleural effusion. The observed bony thorax exhibits no acute abnormality. IMPRESSION: Mildly increased lung markings bilaterally may reflect interstitial edema of cardiac or noncardiac cause. Interstitial pneumonia is felt less likely. There is underlying COPD. Thoracic aortic atherosclerosis. Electronically Signed   By: David  Martinique M.D.   On: 07/07/2018 15:15    Review of Systems  Constitutional: Positive for fever (fevers on and off since August).  HENT: Negative.   Eyes: Negative.   Respiratory: Negative.   Cardiovascular: Negative.   Gastrointestinal: Negative.        Rectum  hurts when he walks   Genitourinary: Negative.   Musculoskeletal: Negative.   Skin:       They have been looking at this site for about 3 weeks.   Neurological:       He has been weak but this is the first time he has passed out.  Endo/Heme/Allergies: Negative.   Psychiatric/Behavioral: Negative.  Blood pressure (!) 111/56, pulse 79, temperature 97.8 F (36.6 C), temperature source Oral, resp. rate 16, height _0  (1.702 m), weight 66.5 kg, SpO2 95 %. Physical Exam  Constitutional: He is oriented to person, place, and time. He appears well-developed.  He appears tired and worn down, he was trying to get OOB, to void and was not able to hold it while in bed.    Cardiovascular: Regular rhythm, normal heart sounds and intact distal pulses.  No murmur heard. Tachycardic  Respiratory: Effort normal and breath sounds normal. No respiratory distress. He has no wheezes. He has no rales. He exhibits no tenderness.  GI: Soft. Bowel sounds are normal. He exhibits no distension and no mass. There is no tenderness. There is no rebound and no guarding.  Genitourinary:  Genitourinary Comments: See pictures below  Musculoskeletal: He exhibits no edema or tenderness.  Neurological: He is alert and oriented to person, place, and time. No cranial nerve deficit.  Skin: Skin is warm and dry. No rash noted. No erythema. No pallor.  Psychiatric: He has a normal mood and affect. His behavior is normal. Judgment and thought content normal.   Right side down, hard to see the site on the left, goes down and into the rectum. There may be some fluid just below the finger and that necrotic site top of the picture.   He had these sites packed with wet to dry dressing.  The largest is about 3 cm x 3 cm, the other two are about 2 cm x 1 cm.      Assessment/Plan: Drained perirectal abscessed with necrotic base. New site on the left going into the rectum - possible fistula  Myelodysplasia with multilineage dysplasia.  - Bone marrow bx requested WBC - 300, H/H 8.1/24.5,  - Platelets 11,000 COPD/hx of tobacco use - quit in January Type II diabetes Hypertension Malnutrition/deconditioning.  Plan:  Sitz bath and redress for now.  Agree with broad spectrum antibiotics.  Will review with Dr. Rosendo Gros.  Suha Schoenbeck 07/08/2018, 10:24 AM

## 2018-07-09 ENCOUNTER — Inpatient Hospital Stay (HOSPITAL_COMMUNITY): Payer: Medicare Other

## 2018-07-09 ENCOUNTER — Inpatient Hospital Stay: Payer: Medicare Other | Admitting: Nutrition

## 2018-07-09 DIAGNOSIS — J410 Simple chronic bronchitis: Secondary | ICD-10-CM

## 2018-07-09 LAB — TYPE AND SCREEN
ABO/RH(D): O POS
Antibody Screen: NEGATIVE
Unit division: 0
Unit division: 0

## 2018-07-09 LAB — CBC WITH DIFFERENTIAL/PLATELET
ABS IMMATURE GRANULOCYTES: 0.02 10*3/uL (ref 0.00–0.07)
Basophils Absolute: 0 10*3/uL (ref 0.0–0.1)
Basophils Relative: 3 %
EOS ABS: 0 10*3/uL (ref 0.0–0.5)
Eosinophils Relative: 7 %
HEMATOCRIT: 22.5 % — AB (ref 39.0–52.0)
Hemoglobin: 7.8 g/dL — ABNORMAL LOW (ref 13.0–17.0)
IMMATURE GRANULOCYTES: 7 %
Lymphocytes Relative: 70 %
Lymphs Abs: 0.2 10*3/uL — ABNORMAL LOW (ref 0.7–4.0)
MCH: 29.5 pg (ref 26.0–34.0)
MCHC: 34.7 g/dL (ref 30.0–36.0)
MCV: 85.2 fL (ref 80.0–100.0)
MONOS PCT: 10 %
Monocytes Absolute: 0 10*3/uL — ABNORMAL LOW (ref 0.1–1.0)
NEUTROS PCT: 3 %
Neutro Abs: 0 10*3/uL — ABNORMAL LOW (ref 1.7–7.7)
Platelets: 89 10*3/uL — ABNORMAL LOW (ref 150–400)
RBC: 2.64 MIL/uL — ABNORMAL LOW (ref 4.22–5.81)
RDW: 13.6 % (ref 11.5–15.5)
WBC: 0.3 10*3/uL — CL (ref 4.0–10.5)
nRBC: 0 % (ref 0.0–0.2)

## 2018-07-09 LAB — COMPREHENSIVE METABOLIC PANEL
ALT: 31 U/L (ref 0–44)
ANION GAP: 10 (ref 5–15)
AST: 21 U/L (ref 15–41)
Albumin: 2 g/dL — ABNORMAL LOW (ref 3.5–5.0)
Alkaline Phosphatase: 173 U/L — ABNORMAL HIGH (ref 38–126)
BILIRUBIN TOTAL: 1.1 mg/dL (ref 0.3–1.2)
BUN: 12 mg/dL (ref 8–23)
CHLORIDE: 100 mmol/L (ref 98–111)
CO2: 23 mmol/L (ref 22–32)
Calcium: 8.7 mg/dL — ABNORMAL LOW (ref 8.9–10.3)
Creatinine, Ser: 0.73 mg/dL (ref 0.61–1.24)
Glucose, Bld: 148 mg/dL — ABNORMAL HIGH (ref 70–99)
POTASSIUM: 3 mmol/L — AB (ref 3.5–5.1)
Sodium: 133 mmol/L — ABNORMAL LOW (ref 135–145)
Total Protein: 6.9 g/dL (ref 6.5–8.1)

## 2018-07-09 LAB — GLUCOSE, CAPILLARY
GLUCOSE-CAPILLARY: 119 mg/dL — AB (ref 70–99)
GLUCOSE-CAPILLARY: 211 mg/dL — AB (ref 70–99)
Glucose-Capillary: 131 mg/dL — ABNORMAL HIGH (ref 70–99)
Glucose-Capillary: 126 mg/dL — ABNORMAL HIGH (ref 70–99)
Glucose-Capillary: 126 mg/dL — ABNORMAL HIGH (ref 70–99)

## 2018-07-09 LAB — PROCALCITONIN: PROCALCITONIN: 0.3 ng/mL

## 2018-07-09 LAB — BPAM RBC
BLOOD PRODUCT EXPIRATION DATE: 201910242359
Blood Product Expiration Date: 201910242359
ISSUE DATE / TIME: 201910142243
ISSUE DATE / TIME: 201910150422
Unit Type and Rh: 5100
Unit Type and Rh: 5100

## 2018-07-09 LAB — MRSA PCR SCREENING: MRSA BY PCR: NEGATIVE

## 2018-07-09 MED ORDER — AMOXICILLIN-POT CLAVULANATE 875-125 MG PO TABS
1.0000 | ORAL_TABLET | Freq: Two times a day (BID) | ORAL | Status: DC
  Administered 2018-07-09 – 2018-07-11 (×5): 1 via ORAL
  Filled 2018-07-09 (×5): qty 1

## 2018-07-09 MED ORDER — SODIUM CHLORIDE 0.9% FLUSH: 10.0000 mL | INTRAVENOUS | Status: DC | PRN

## 2018-07-09 MED ORDER — MIDAZOLAM HCL 2 MG/2ML IJ SOLN
INTRAMUSCULAR | Status: AC
  Filled 2018-07-09: qty 2

## 2018-07-09 MED ORDER — MIDAZOLAM HCL 2 MG/2ML IJ SOLN
INTRAMUSCULAR | Status: AC | PRN
  Administered 2018-07-09: 1 mg via INTRAVENOUS

## 2018-07-09 MED ORDER — IPRATROPIUM-ALBUTEROL 0.5-2.5 (3) MG/3ML IN SOLN: 3.0000 mL | Freq: Four times a day (QID) | RESPIRATORY_TRACT | Status: DC | PRN

## 2018-07-09 MED ORDER — LIDOCAINE HCL 1 % IJ SOLN
INTRAMUSCULAR | Status: AC
  Filled 2018-07-09: qty 20

## 2018-07-09 MED ORDER — FLUCONAZOLE 100 MG PO TABS
100.0000 mg | ORAL_TABLET | Freq: Every day | ORAL | Status: DC
  Administered 2018-07-09 – 2018-07-11 (×3): 100 mg via ORAL
  Filled 2018-07-09 (×3): qty 1

## 2018-07-09 MED ORDER — DOXYCYCLINE HYCLATE 100 MG PO TABS
100.0000 mg | ORAL_TABLET | Freq: Two times a day (BID) | ORAL | Status: DC
  Administered 2018-07-09 – 2018-07-11 (×5): 100 mg via ORAL
  Filled 2018-07-09 (×5): qty 1

## 2018-07-09 MED ORDER — FENTANYL CITRATE (PF) 100 MCG/2ML IJ SOLN
INTRAMUSCULAR | Status: AC | PRN
  Administered 2018-07-09: 50 ug via INTRAVENOUS

## 2018-07-09 MED ORDER — POTASSIUM CHLORIDE CRYS ER 20 MEQ PO TBCR
40.0000 meq | EXTENDED_RELEASE_TABLET | Freq: Three times a day (TID) | ORAL | Status: AC
  Administered 2018-07-09 (×2): 40 meq via ORAL
  Filled 2018-07-09 (×2): qty 2

## 2018-07-09 MED ORDER — FENTANYL CITRATE (PF) 100 MCG/2ML IJ SOLN
INTRAMUSCULAR | Status: AC
  Filled 2018-07-09: qty 2

## 2018-07-09 NOTE — Progress Notes (Signed)
PROGRESS NOTE                                                                                                                                                                                                             Patient Demographics:    Andrew [name], is a 67 y.o. male, DOB - 1951-04-24, QQP:[id number]  Admit date - 07/07/2018   Admitting Physician Merton Border, MD  Outpatient Primary MD for the patient is [name], MD  LOS - 2  Chief Complaint  Patient presents with  . 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  He is followed by Dr. Betsy Coder, and has undergone 2 rounds of chemotherapy which have been ineffective.    Been struggling with rectal wounds for the last several weeks and is being treated outpatient by his PCP, he continues to be dependent on red cell and platelet transfusions.  He was admitted with sepsis due to a perianal abscess.   Subjective:   Patient in bed, appears comfortable, denies any headache, no fever, no chest pain or pressure, no shortness of breath , no abdominal pain. No focal weakness.   Assessment  & Plan :     1.  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  Seen by general surgery, conservative management for now, 24 hours of IV antibiotics, sepsis seems to have defervesced for now, continue to monitor closely in stepdown due to severe underlying pancytopenia, so far blood cultures negative.  Wish to oral antibiotics after 24 hours of IV if cultures remain negative & continue to monitor closely.  2. 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 Annitta Jersey patient's oncologist he is on board, bone marrow biopsy has been ordered, platelets only if needed for surgery should be  crossmatched, he has history of fevers with platelet transfusions.  Will defer management to heme-onc.  Currently on prophylactic acyclovir and doxycycline.  3.  Hypertension.  Currently on beta-blocker and stable.  4.  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  5.  GERD.  On PPI.  6.  COPD.  At baseline no acute issues.    7. DM type II.  On sliding scale we will continue to monitor.  Lab Results  Component Value Date   HGBA1C 7.3 (H) 04/02/2018   CBG (last 3)  Recent Labs  07/08/18 1713 07/08/18 2115 07/09/18 0819  GLUCAP 171* 141* 131*     Family Communication  :  None  Code Status :  Full  Disposition Plan  :  Stepdown  Consults  :  Haem, CCS  Procedures  :    Bone marrow biospy -   DVT Prophylaxis  :    SCDs    Lab Results  Component Value Date   PLT 11 (LL) 07/08/2018    Diet :  Diet Order            Diet NPO time specified Except for: Sips with Meds  Diet effective now               Inpatient Medications Scheduled Meds: . sodium chloride   Intravenous Once  . acyclovir  400 mg Oral BID  . alteplase  2 mg Intracatheter Once  . amoxicillin-clavulanate  1 tablet Oral Q12H  . chlorhexidine  15 mL Mouth/Throat BID  . dorzolamide-timolol  1 drop Both Eyes BID  . doxycycline  100 mg Oral Q12H  . feeding supplement (ENSURE ENLIVE)  237 mL Oral TID BM  . fentaNYL      . fluconazole  100 mg Oral Daily  . gemfibrozil  600 mg Oral BID  . Influenza vac split quadrivalent PF  0.5 mL Intramuscular Tomorrow-1000  . insulin aspart  0-5 Units Subcutaneous QHS  . insulin aspart  0-9 Units Subcutaneous TID WC  . ipratropium-albuterol  3 mL Nebulization BID  . latanoprost  1 drop Both Eyes QHS  . lidocaine      . metoprolol tartrate  25 mg Oral BID  . midazolam      . pantoprazole  40 mg Oral BID  . pneumococcal 23 valent vaccine  0.5 mL Intramuscular Tomorrow-1000  . potassium chloride  40 mEq Oral Q8H   Continuous Infusions:  PRN  Meds:.acetaminophen, morphine injection, [DISCONTINUED] ondansetron **OR** ondansetron (ZOFRAN) IV, traMADol  Antibiotics  :   Anti-infectives (From admission, onward)   Start     Dose/Rate Route Frequency Ordered Stop   07/09/18 1000  fluconazole (DIFLUCAN) tablet 100 mg     100 mg Oral Daily 07/09/18 0705     07/09/18 1000  doxycycline (VIBRA-TABS) tablet 100 mg     100 mg Oral Every 12 hours 07/09/18 0947     07/09/18 1000  amoxicillin-clavulanate (AUGMENTIN) 875-125 MG per tablet 1 tablet     1 tablet Oral Every 12 hours 07/09/18 0947     07/08/18 0900  piperacillin-tazobactam (ZOSYN) IVPB 3.375 g  Status:  Discontinued     3.375 g 12.5 mL/hr over 240 Minutes Intravenous Every 8 hours 07/07/18 1737 07/09/18 0947   07/08/18 0800  vancomycin (VANCOCIN) 500 mg in sodium chloride 0.9 % 100 mL IVPB  Status:  Discontinued     500 mg 100 mL/hr over 60 Minutes Intravenous Every 12 hours 07/07/18 1737 07/09/18 0947   07/08/18 0100  piperacillin-tazobactam (ZOSYN) IVPB 3.375 g     3.375 g 100 mL/hr over 30 Minutes Intravenous  Once 07/07/18 1729 07/08/18 0153   07/07/18 2200  acyclovir (ZOVIRAX) tablet 400 mg     400 mg Oral 2 times daily 07/07/18 1930     07/07/18 1945  fluconazole (DIFLUCAN) tablet 50 mg  Status:  Discontinued     50 mg Oral Daily 07/07/18 1930 07/07/18 1950   07/07/18 1945  metroNIDAZOLE (FLAGYL) IVPB 500 mg  Status:  Discontinued     500 mg  100 mL/hr over 60 Minutes Intravenous Every 8 hours 07/07/18 1930 07/07/18 1952   07/07/18 1445  metroNIDAZOLE (FLAGYL) IVPB 500 mg  Status:  Discontinued     500 mg 100 mL/hr over 60 Minutes Intravenous Every 8 hours 07/07/18 1430 07/07/18 1734   07/07/18 1445  ceFEPIme (MAXIPIME) 2 g in sodium chloride 0.9 % 100 mL IVPB     2 g 200 mL/hr over 30 Minutes Intravenous  Once 07/07/18 1442 07/07/18 1622   07/07/18 1445  vancomycin (VANCOCIN) 1,500 mg in sodium chloride 0.9 % 500 mL IVPB     1,500 mg 250 mL/hr over 120 Minutes  Intravenous  Once 07/07/18 1442 07/07/18 2008          Objective:   Vitals:   07/09/18 0821 07/09/18 0914 07/09/18 0917 07/09/18 0935  BP:  121/66 121/66 125/63  Pulse:  92 92 96  Resp:  18 18 (!) 21  Temp: 99.8 F (37.7 C) 99.4 F (37.4 C) 99.4 F (37.4 C)   TempSrc: Oral Oral Oral   SpO2:  99% 99% 100%  Weight:      Height:        Wt Readings from Last 3 Encounters:  07/07/18 66.5 kg  07/04/18 65.5 kg  06/25/18 70 kg     Intake/Output Summary (Last 24 hours) at 07/09/2018 0947 Last data filed at 07/09/2018 0909 Gross per 24 hour  Intake 1357.25 ml  Output 1785 ml  Net -427.75 ml     Physical Exam  Awake Alert, Oriented X 3, No new F.N deficits, Normal affect [place].AT,PERRAL Supple Neck,No JVD, No cervical lymphadenopathy appriciated.  Symmetrical Chest wall movement, Good air movement bilaterally, CTAB RRR,No Gallops, Rubs or new Murmurs, No Parasternal Heave +ve B.Sounds, Abd Soft, No tenderness, No organomegaly appriciated, No rebound - guarding or rigidity. No Cyanosis, Clubbing or edema, No new Rash or bruise   Data Review:    CBC Recent Labs  Lab 07/04/18 0812 07/07/18 1442 07/08/18 0937  WBC 0.4* 0.4* 0.3*  HGB 8.1* 7.4* 8.1*  HCT 24.3* 22.8* 24.5*  PLT 5* 16* 11*  MCV 89.3 89.4 86.6  MCH 29.8 29.0 28.6  MCHC 33.3 32.5 33.1  RDW 12.8 12.1 14.0  LYMPHSABS 0.3* 0.3*  --   MONOABS 0.0* 0.0*  --   EOSABS 0.0 0.0  --   BASOSABS 0.0 0.0  --     Chemistries  Recent Labs  Lab 07/07/18 1442 07/08/18 0937 07/09/18 0422  NA 131* 135 133*  K 3.8 4.0 3.0*  CL 98 103 100  CO2 '22 23 23  '$ GLUCOSE 153* 124* 148*  BUN 34* 20 12  CREATININE 1.58* 0.84 0.73  CALCIUM 8.8* 8.8* 8.7*  MG  --  2.0  --   AST 27 14* 21  ALT '31 24 31  '$ ALKPHOS 161* 139* 173*  BILITOT 0.7 2.0* 1.1   ------------------------------------------------------------------------------------------------------------------ No results for input(s): CHOL, HDL, LDLCALC, TRIG,  CHOLHDL, LDLDIRECT in the last 72 hours.  Lab Results  Component Value Date   HGBA1C 7.3 (H) 04/02/2018   ------------------------------------------------------------------------------------------------------------------ No results for input(s): TSH, T4TOTAL, T3FREE, THYROIDAB in the last 72 hours.  Invalid input(s): FREET3 ------------------------------------------------------------------------------------------------------------------ No results for input(s): VITAMINB12, FOLATE, FERRITIN, TIBC, IRON, RETICCTPCT in the last 72 hours.  Coagulation profile No results for input(s): INR, PROTIME in the last 168 hours.  No results for input(s): DDIMER in the last 72 hours.  Cardiac Enzymes Recent Labs  Lab 07/07/18 1955 07/08/18 0937  TROPONINI <0.03 <  0.03   ------------------------------------------------------------------------------------------------------------------ No results found for: BNP  Micro Results Recent Results (from the past 240 hour(s))  Blood Culture (routine x 2)     Status: None (Preliminary result)   Collection Time: 07/07/18  2:35 PM  Result Value Ref Range Status   Specimen Description BLOOD RIGHT ARM  Final   Special Requests   Final    BOTTLES DRAWN AEROBIC AND ANAEROBIC Blood Culture adequate volume   Culture   Final    NO GROWTH 2 DAYS Performed at [facility], [place] [address] [place], [place] [address]    Report Status PENDING  Incomplete  Urine culture     Status: None   Collection Time: 07/07/18  3:42 PM  Result Value Ref Range Status   Specimen Description URINE, CLEAN CATCH  Final   Special Requests NONE  Final   Culture   Final    NO GROWTH Performed at [facility], [place] [address] [place], [place] [address]    Report Status 07/08/2018 FINAL  Final  Culture, blood (Routine X 2) w Reflex to ID Panel     Status: None (Preliminary result)   Collection Time: 07/08/18  9:35 AM  Result Value Ref Range Status   Specimen  Description BLOOD LEFT ANTECUBITAL  Final   Special Requests   Final    BOTTLES DRAWN AEROBIC AND ANAEROBIC Blood Culture adequate volume   Culture   Final    NO GROWTH < 24 HOURS Performed at [facility], [place] [address] [place], [place] [address]    Report Status PENDING  Incomplete    Radiology Reports Ct Abdomen Pelvis W Contrast  Result Date: 07/07/2018 CLINICAL DATA:  Recent onset of weakness. Opened wound of the anus. History of COPD, diabetes, hypertension and myelodysplasia. EXAM: CT ABDOMEN AND PELVIS WITH CONTRAST TECHNIQUE: Multidetector CT imaging of the abdomen and pelvis was performed using the standard protocol following bolus administration of intravenous contrast. CONTRAST:  13m OMNIPAQUE IOHEXOL 300 MG/ML  SOLN COMPARISON:  Abdominopelvic CT 04/02/2018. FINDINGS: Lower chest: Images through the lung bases are degraded by breathing artifact. Subpleural right lower lobe nodule measuring 7 mm on image 20/4 similar to the previous study. There is a new 7 mm right lower lobe nodule on image 20/4 as well as a new 6 mm right lower lobe nodule on image 11/4. The heart is enlarged. No significant pleural or pericardial effusion. Hepatobiliary: The liver is normal in density without focal abnormality. No evidence of gallstones, gallbladder wall thickening or biliary dilatation. Pancreas: Unremarkable. No pancreatic ductal dilatation or surrounding inflammatory changes. Spleen: Normal in size without focal abnormality. Adrenals/Urinary Tract: Both adrenal glands appear normal. There is a stable tiny nonobstructing calculus in the lower pole of the left kidney, best seen on sagittal image 75/8. No evidence of ureteral calculus, hydronephrosis or perinephric soft tissue stranding. Probable tiny cyst anteriorly in the interpolar region of the left kidney. The bladder appears unremarkable. Stomach/Bowel: No evidence of bowel wall thickening, distention or surrounding inflammatory change.  The appendix appears normal. There is moderate stool throughout the colon. There is a perianal air-fluid collection measuring up to 3.4 x 1.9 cm on axial image 100/3, likely reflecting a fistula and abscess. This is best demonstrated on sagittal images 55 through 60/8 and demonstrates surrounding enhancement. No evidence of intrapelvic abscess. Vascular/Lymphatic: There are no enlarged abdominal or pelvic lymph nodes. Small inguinal lymph nodes are likely reactive. Moderate aortic and branch vessel atherosclerosis.  Reproductive: Stable mild enlargement of the prostate gland. Other: Small umbilical hernia containing only fat. No ascites, free air or intra-abdominal abscess. Musculoskeletal: No acute or significant osseous findings. IMPRESSION: 1. New perianal air-fluid collection with surrounding enhancement consistent with fistula and perianal abscess. 2. No evidence of bowel obstruction or intra-abdominal abscess. 3. Increased nodularity at the right lung base since recent CT of 3 months ago, possibly inflammatory. Follow-up chest CT in 3-6 months recommended to exclude the possibility of metastatic disease. 4.  Aortic Atherosclerosis (ICD10-I70.0). Electronically Signed   By: Richardean Sale M.D.   On: 07/07/2018 19:31   Dg Chest Portable 1 View  Result Date: 07/07/2018 CLINICAL DATA:  Patient fell in a parking lot, possible syncopal episode. The patient is shivering and the skin is hot to the touch. History of COPD-asthma, former smoker, myelodysplastic syndrome. EXAM: PORTABLE CHEST 1 VIEW COMPARISON:  PA and lateral chest x-ray of May 24, 2018. FINDINGS: The lungs are adequately inflated. The interstitial markings are coarse and more conspicuous than on the previous study. The heart is top-normal in size. The pulmonary vascularity is not clearly engorged. There is calcification in the wall of the thoracic aorta. The right-sided PICC line tip terminates at the junction of the middle and distal thirds of  the SVC. There is no pleural effusion. The observed bony thorax exhibits no acute abnormality. IMPRESSION: Mildly increased lung markings bilaterally may reflect interstitial edema of cardiac or noncardiac cause. Interstitial pneumonia is felt less likely. There is underlying COPD. Thoracic aortic atherosclerosis. Electronically Signed   By: David  Martinique M.D.   On: 07/07/2018 15:15    Time Spent in minutes  30   Lala Lund M.D on 07/09/2018 at 9:47 AM  To page go to [email or web address] - password [facility]

## 2018-07-09 NOTE — Procedures (Signed)
Interventional Radiology Procedure Note  Procedure: CT guided aspirate and core biopsy of right iliac bone Complications: None Recommendations: - Bedrest supine x 1 hrs - Hydrocodone PRN  Pain - Follow biopsy results  Signed,  Heath K. McCullough, MD   

## 2018-07-09 NOTE — Progress Notes (Signed)
IP PROGRESS NOTE  Subjective:   Andrew [name] denies bleeding and fever.  He continues to have discomfort at the left thigh.  Objective: Vital signs in last 24 hours: Blood pressure 106/67, pulse 91, temperature 98 F (36.7 C), temperature source Oral, resp. rate 15, height '5\' 7"'$  (1.702 m), weight 146 lb 9.7 oz (66.5 kg), SpO2 100 %.  Intake/Output from previous day: 10/15 0701 - 10/16 0700 In: 985.3 [P.O.:300; Blood:530; IV Piggyback:155.3] Out: 4270 [Urine:1710]  Physical Exam:  HEENT: Thick white coat over the tongue, no oral bleeding  Abdomen: Nontender, no hepatosplenomegaly Extremities: No leg edema Rectal: Dressing covering the perianal ulcers  Portacath/PICC-without erythema  Lab Results: Recent Labs    07/08/18 0937 07/09/18 1247  WBC 0.3* 0.3*  HGB 8.1* 7.8*  HCT 24.5* 22.5*  PLT 11* 89*    BMET Recent Labs    07/08/18 0937 07/09/18 0422  NA 135 133*  K 4.0 3.0*  CL 103 100  CO2 23 23  GLUCOSE 124* 148*  BUN 20 12  CREATININE 0.84 0.73  CALCIUM 8.8* 8.7*    No results found for: CEA1  Studies/Results: Ct Abdomen Pelvis W Contrast  Result Date: 07/07/2018 CLINICAL DATA:  Recent onset of weakness. Opened wound of the anus. History of COPD, diabetes, hypertension and myelodysplasia. EXAM: CT ABDOMEN AND PELVIS WITH CONTRAST TECHNIQUE: Multidetector CT imaging of the abdomen and pelvis was performed using the standard protocol following bolus administration of intravenous contrast. CONTRAST:  125m OMNIPAQUE IOHEXOL 300 MG/ML  SOLN COMPARISON:  Abdominopelvic CT 04/02/2018. FINDINGS: Lower chest: Images through the lung bases are degraded by breathing artifact. Subpleural right lower lobe nodule measuring 7 mm on image 20/4 similar to the previous study. There is a new 7 mm right lower lobe nodule on image 20/4 as well as a new 6 mm right lower lobe nodule on image 11/4. The heart is enlarged. No significant pleural or pericardial effusion. Hepatobiliary:  The liver is normal in density without focal abnormality. No evidence of gallstones, gallbladder wall thickening or biliary dilatation. Pancreas: Unremarkable. No pancreatic ductal dilatation or surrounding inflammatory changes. Spleen: Normal in size without focal abnormality. Adrenals/Urinary Tract: Both adrenal glands appear normal. There is a stable tiny nonobstructing calculus in the lower pole of the left kidney, best seen on sagittal image 75/8. No evidence of ureteral calculus, hydronephrosis or perinephric soft tissue stranding. Probable tiny cyst anteriorly in the interpolar region of the left kidney. The bladder appears unremarkable. Stomach/Bowel: No evidence of bowel wall thickening, distention or surrounding inflammatory change. The appendix appears normal. There is moderate stool throughout the colon. There is a perianal air-fluid collection measuring up to 3.4 x 1.9 cm on axial image 100/3, likely reflecting a fistula and abscess. This is best demonstrated on sagittal images 55 through 60/8 and demonstrates surrounding enhancement. No evidence of intrapelvic abscess. Vascular/Lymphatic: There are no enlarged abdominal or pelvic lymph nodes. Small inguinal lymph nodes are likely reactive. Moderate aortic and branch vessel atherosclerosis. Reproductive: Stable mild enlargement of the prostate gland. Other: Small umbilical hernia containing only fat. No ascites, free air or intra-abdominal abscess. Musculoskeletal: No acute or significant osseous findings. IMPRESSION: 1. New perianal air-fluid collection with surrounding enhancement consistent with fistula and perianal abscess. 2. No evidence of bowel obstruction or intra-abdominal abscess. 3. Increased nodularity at the right lung base since recent CT of 3 months ago, possibly inflammatory. Follow-up chest CT in 3-6 months recommended to exclude the possibility of metastatic disease. 4.  Aortic  Atherosclerosis (ICD10-I70.0). Electronically Signed   By:  Richardean Sale M.D.   On: 07/07/2018 19:31   Dg Chest Portable 1 View  Result Date: 07/07/2018 CLINICAL DATA:  Patient fell in a parking lot, possible syncopal episode. The patient is shivering and the skin is hot to the touch. History of COPD-asthma, former smoker, myelodysplastic syndrome. EXAM: PORTABLE CHEST 1 VIEW COMPARISON:  PA and lateral chest x-ray of May 24, 2018. FINDINGS: The lungs are adequately inflated. The interstitial markings are coarse and more conspicuous than on the previous study. The heart is top-normal in size. The pulmonary vascularity is not clearly engorged. There is calcification in the wall of the thoracic aorta. The right-sided PICC line tip terminates at the junction of the middle and distal thirds of the SVC. There is no pleural effusion. The observed bony thorax exhibits no acute abnormality. IMPRESSION: Mildly increased lung markings bilaterally may reflect interstitial edema of cardiac or noncardiac cause. Interstitial pneumonia is felt less likely. There is underlying COPD. Thoracic aortic atherosclerosis. Electronically Signed   By: David  Martinique M.D.   On: 07/07/2018 15:15   Ct Bone Marrow Biopsy & Aspiration  Result Date: 07/09/2018 INDICATION: 68 year old male with pancytopenia EXAM: CT GUIDED BONE MARROW ASPIRATION AND CORE BIOPSY Interventional Radiologist:  Criselda Peaches, MD MEDICATIONS: None. ANESTHESIA/SEDATION: Moderate (conscious) sedation was employed during this procedure. A total of 1 milligrams versed and 50 micrograms fentanyl were administered intravenously. The patient's level of consciousness and vital signs were monitored continuously by radiology nursing throughout the procedure under my direct supervision. Total monitored sedation time: 14 minutes FLUOROSCOPY TIME:  None COMPLICATIONS: None immediate. Estimated blood loss: <25 mL PROCEDURE: Informed written consent was obtained from the patient after a thorough discussion of the  procedural risks, benefits and alternatives. All questions were addressed. Maximal Sterile Barrier Technique was utilized including caps, mask, sterile gowns, sterile gloves, sterile drape, hand hygiene and skin antiseptic. A timeout was performed prior to the initiation of the procedure. The patient was positioned prone and non-contrast localization CT was performed of the pelvis to demonstrate the iliac marrow spaces. Maximal barrier sterile technique utilized including caps, mask, sterile gowns, sterile gloves, large sterile drape, hand hygiene, and betadine prep. Under sterile conditions and local anesthesia, an 11 gauge coaxial bone biopsy needle was advanced into the right iliac marrow space. Needle position was confirmed with CT imaging. Initially, bone marrow aspiration was performed. Next, the 11 gauge outer cannula was utilized to obtain a right iliac bone marrow core biopsy. Needle was removed. Hemostasis was obtained with compression. The patient tolerated the procedure well. Samples were prepared with the cytotechnologist. IMPRESSION: Technically successful CT-guided bone marrow biopsy and aspiration. Electronically Signed   By: Jacqulynn Cadet M.D.   On: 07/09/2018 11:10    Medications: I have reviewed the patient's current medications.  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  Cycle 1 5-azacytidine 05/06/2018  Cycle25-azacytidine 06/16/2018 2.Renal insufficiency 3.Diabetes 4.Hemoccult positive stool  Upper endoscopy 04/03/2018-chronic gastritis, duodenitis  Colonoscopy 04/04/2018-diverticulosis in the sigmoid colon 5.Right lower lobe 7 mm subpleural nodule on  chest CT 04/02/2018 6.Admission with febrile neutropenia 05/20/2018- placed on cefepime, antibiotics changed to vancomycin/meropenem on 05/21/2018, anidulafungin added 9/1/2019antibiotics stopped 05/31/2018, discharge 06/03/2018 with prophylactic acyclovir, Bactrim, and Diflucan. Bactrim discontinued and Levaquin started 06/05/2018  7. 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 Febrile reaction following a platelet transfusion 05/29/2018, 06/05/2018, 07/05/2018 11.Perineal ulceration versus fistulae-started sitz baths 06/23/2018  CT 07/07/2018-perianal abscess 12.  Weight loss 13.  Admission with fever and severe neutropenia 07/07/2018   Andrew [name] appears stable.  He continues to have a low-grade fever.  He received multiple platelet transfusions yesterday and this morning.  The platelet count is up today. Surgery does not recommend drainage of the perianal abscess. Andrew [name] is scheduled for a bone marrow biopsy today.  I will follow-up on the bone marrow biopsy result 07/10/2018.  I will consider transfer to the leukemia service at [facility] based on the bone marrow result.  Recommendations: 1.  Antibiotics for the perianal abscess as recommended by the medical service and infectious disease 2.  Restaging bone marrow biopsy today 3.  Management of the perianal ulcers/abscess per surgery 4.  Begin Diflucan for the oral candidiasis     LOS: 2 days   Betsy Coder, MD   07/09/2018, 2:45 PM

## 2018-07-09 NOTE — Care Management Note (Addendum)
Case Management Note  Patient Details  Name: Andrew [name] MRN: [id number] Date of Birth: 04-01-1951  Subjective/Objective:         Sepsis/ perianal abscess. 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 From home with family.  Federico Flake (Daughter) Annabell Howells (Sister)    [phone]      PCP:  PCP:  Kevan Ny, Dr. Dominica Severin Sherrill/ ONC  Action/Plan:  PT evaluation pending....NCM following for TOC needs.  Expected Discharge Date:  07/09/18               Expected Discharge Plan:  Home/Self Care  In-House Referral:     Discharge planning Services  CM Consult  Post Acute Care Choice:    Choice offered to:     DME Arranged:    DME Agency:     HH Arranged:    HH Agency:     Status of Service:  In process, will continue to follow  If discussed at Long Length of Stay Meetings, dates discussed:    Additional Comments:  Sharin Mons, RN 07/09/2018, 11:12 AM

## 2018-07-09 NOTE — Progress Notes (Signed)
Physical Therapy Wound Treatment Patient Details  Name: Andrew [name] MRN: [id number] Date of Birth: [birth year]  Today's Date: 07/09/2018 Time: 7829-5621 Time Calculation (min): 30 min  Subjective  Subjective: Pt asking what hydrotherapy is. Patient and Family Stated Goals: Return home Prior Treatments: dressing change, sitz bath  Pain Score:    Wound Assessment  Pressure Injury 07/07/18 Unstageable - Full thickness tissue loss in which the base of the ulcer is covered by slough (yellow, tan, gray, green or brown) and/or eschar (tan, brown or black) in the wound bed. Superior (Active)  Dressing Type Gauze (Comment);Barrier Film (skin prep);Moist to dry 07/09/2018  3:00 PM  Dressing Intact 07/09/2018  3:00 PM  Dressing Change Frequency Twice a day 07/09/2018  3:00 PM  State of Healing Eschar 07/09/2018  3:00 PM  Site / Wound Assessment Black;Brown 07/09/2018  3:00 PM  % Wound base Red or Granulating 0% 07/09/2018  3:00 PM  % Wound base Yellow/Fibrinous Exudate 0% 07/09/2018  3:00 PM  % Wound base Black/Eschar 100% 07/09/2018  3:00 PM  % Wound base Other/Granulation Tissue (Comment) 0% 07/09/2018  3:00 PM  Peri-wound Assessment Black 07/07/2018  8:00 PM  Wound Length (cm) 3 cm 07/09/2018  3:00 PM  Wound Width (cm) 2 cm 07/09/2018  3:00 PM  Wound Depth (cm) 0.1 cm 07/09/2018  3:00 PM  Wound Surface Area (cm^2) 6 cm^2 07/09/2018  3:00 PM  Wound Volume (cm^3) 0.6 cm^3 07/09/2018  3:00 PM  Drainage Amount Scant 07/09/2018  3:00 PM  Drainage Description Serous 07/09/2018  3:00 PM  Treatment Hydrotherapy (Pulse lavage);Packing (Saline gauze) 07/09/2018  3:00 PM     Wound / Incision (Open or Dehisced) 07/09/18 Other (Comment) Buttocks Right Middle (Active)  Dressing Type Gauze (Comment);Barrier Film (skin prep);Moist to dry 07/09/2018  3:00 PM  Dressing Changed Changed 07/09/2018  3:00 PM  Dressing Status Clean;Dry;Intact 07/09/2018  3:00 PM  Dressing Change Frequency Twice a day  07/09/2018  3:00 PM  Site / Wound Assessment Black;Brown 07/09/2018  3:00 PM  % Wound base Red or Granulating 0% 07/09/2018  3:00 PM  % Wound base Yellow/Fibrinous Exudate 0% 07/09/2018  3:00 PM  % Wound base Black/Eschar 100% 07/09/2018  3:00 PM  % Wound base Other/Granulation Tissue (Comment) 0% 07/09/2018  3:00 PM  Peri-wound Assessment Intact 07/09/2018  3:00 PM  Wound Length (cm) 1 cm 07/09/2018  3:00 PM  Wound Width (cm) 2 cm 07/09/2018  3:00 PM  Wound Depth (cm) 0.1 cm 07/09/2018  3:00 PM  Wound Volume (cm^3) 0.2 cm^3 07/09/2018  3:00 PM  Wound Surface Area (cm^2) 2 cm^2 07/09/2018  3:00 PM  Margins Unattached edges (unapproximated) 07/09/2018  3:00 PM  Closure None 07/09/2018  3:00 PM  Drainage Amount Scant 07/09/2018  3:00 PM  Drainage Description Serous 07/09/2018  3:00 PM  Treatment Hydrotherapy (Pulse lavage);Packing (Saline gauze) 07/09/2018  3:00 PM     Wound / Incision (Open or Dehisced) 07/09/18 Other (Comment) Buttocks Right;Distal Inferior (Active)  Dressing Type Gauze (Comment);Barrier Film (skin prep);Moist to dry 07/09/2018  3:00 PM  Dressing Changed Changed 07/09/2018  3:00 PM  Dressing Status Clean;Dry;Intact 07/09/2018  3:00 PM  Dressing Change Frequency Twice a day 07/09/2018  3:00 PM  Site / Wound Assessment Black;Brown 07/09/2018  3:00 PM  % Wound base Red or Granulating 0% 07/09/2018  3:00 PM  % Wound base Yellow/Fibrinous Exudate 0% 07/09/2018  3:00 PM  % Wound base Black/Eschar 100% 07/09/2018  3:00 PM  % Wound base  Other/Granulation Tissue (Comment) 0% 07/09/2018  3:00 PM  Peri-wound Assessment Intact 07/09/2018  3:00 PM  Wound Length (cm) 1 cm 07/09/2018  3:00 PM  Wound Width (cm) 1.5 cm 07/09/2018  3:00 PM  Wound Depth (cm) 0.1 cm 07/09/2018  3:00 PM  Wound Volume (cm^3) 0.15 cm^3 07/09/2018  3:00 PM  Wound Surface Area (cm^2) 1.5 cm^2 07/09/2018  3:00 PM  Margins Unattached edges (unapproximated) 07/09/2018  3:00 PM  Closure None 07/09/2018  3:00  PM  Drainage Amount Scant 07/09/2018  3:00 PM  Drainage Description Serous 07/09/2018  3:00 PM  Treatment Hydrotherapy (Pulse lavage);Packing (Saline gauze) 07/09/2018  3:00 PM      Hydrotherapy Pulsed lavage therapy - wound location: buttocks Pulsed Lavage with Suction (psi): 8 psi Pulsed Lavage with Suction - Normal Saline Used: 1000 mL Pulsed Lavage Tip: Tip with splash shield   Wound Assessment and Plan  Wound Therapy - Assess/Plan/Recommendations Wound Therapy - Clinical Statement: Pt presents to hydrotherapy with perianal abscess and possible fistula.  Pt seen by hydrotherapy to facilitate removal of necrotic tissue and to promote wound healing. Used PLS for debridement and deferred debridement with insturments due to pt very low platlet count.  Wound Therapy - Functional Problem List: Decr mobility Factors Delaying/Impairing Wound Healing: Multiple medical problems;Diabetes Mellitus;Infection - systemic/local Hydrotherapy Plan: Debridement;Dressing change;Patient/family education;Pulsatile lavage with suction Wound Therapy - Frequency: 6X / week Wound Therapy - Follow Up Recommendations: Other (comment)(primary care physician) Wound Plan: See above  Wound Therapy Goals- Improve the function of patient's integumentary system by progressing the wound(s) through the phases of wound healing (inflammation - proliferation - remodeling) by: Decrease Necrotic Tissue to: 90 Decrease Necrotic Tissue - Progress: Goal set today Increase Granulation Tissue to: 10 Increase Granulation Tissue - Progress: Goal set today Goals/treatment plan/discharge plan were made with and agreed upon by patient/family: Yes Time For Goal Achievement: 7 days Wound Therapy - Potential for Goals: Fair  Goals will be updated until maximal potential achieved or discharge criteria met.  Discharge criteria: when goals achieved, discharge from hospital, MD decision/surgical intervention, no progress towards goals,  refusal/missing three consecutive treatments without notification or medical reason.  GP     Shary Decamp Maycok 07/09/2018, 3:56 PM Jasilyn Holderman Balm Pager [phone] Office [phone]

## 2018-07-09 NOTE — Evaluation (Addendum)
Physical Therapy Evaluation Patient Details Name: Andrew [name] MRN: [id number] DOB: [birth year] Today's Date: 07/09/2018   History of Present Illness  Andrew [name]  is a 67 y.o. male, 67 year old male presenting with 1 week history of fever weakness that has been getting worse.  Patient has a history of myelodysplastic disorder/with multilineage dysplasia being followed by Dr. Learta Codding, renal insufficiency, diabetes mellitus and right buttock ulcer/wound being followed by his primary medical doctor.  Clinical Impression  Patient presents with decreased independence and safety with mobility due to LLE pain, generalized weakness, decreased safety awareness and decreased activity tolerance.  Currently needs min A for OOB to [facility] and limited tolerance to standing or ambulation due to pain.  Previously was ambulatory with cane.  Feel he will benefit from skilled PT in the acute setting to allow d/c home with family support and follow up [facility] services.  IF family cannot assist at home for safety may need STSNF level rehab, but pt prefers home.  Will follow acutely.    Follow Up Recommendations Home health PT;Supervision for mobility/OOB(HH aide)    Equipment Recommendations  Wheelchair cushion (measurements PT);Wheelchair (measurements PT)    Recommendations for Other Services       Precautions / Restrictions Precautions Precautions: Fall Precaution Comments: fell two days ago (PTA)      Mobility  Bed Mobility Overal bed mobility: Needs Assistance Bed Mobility: Supine to Sit;Sit to Supine     Supine to sit: Min guard Sit to supine: Min assist   General bed mobility comments: to sit pt impulsive to get up quick to [facility] with unsafe technique due to L LE pain, assist to supine for L LE  Transfers Overall transfer level: Needs assistance Equipment used: Rolling walker (2 wheeled) Transfers: Sit to/from [facility] Sit to Stand: Min assist Stand pivot transfers: Min  assist       General transfer comment: assist with walker for safety/balance, mod cues to get back to bed from [facility] for steps and aiming to sit near [facility]  Ambulation/Gait             General Gait Details: deferred due to pt c/o L LE pain  Stairs            Wheelchair Mobility    Modified Rankin (Stroke Patients Only)       Balance Overall balance assessment: Needs assistance   Sitting balance-Leahy Scale: Fair Sitting balance - Comments: limited due to perirectal wound   Standing balance support: Bilateral upper extremity supported Standing balance-Leahy Scale: Poor Standing balance comment: UE support needed due to painful L LE and weakness                             Pertinent Vitals/Pain Pain Assessment: Faces Faces Pain Scale: Hurts whole lot Pain Location: L LE with movement and weight bearing Pain Descriptors / Indicators: Guarding;Grimacing;Aching Pain Intervention(s): Monitored during session;Repositioned;Limited activity within patient's tolerance    Home Living Family/patient expects to be discharged to:: Private residence Living Arrangements: Children;Other relatives(daughter and her children) Available Help at Discharge: Family;Available PRN/intermittently Type of Home: House Home Access: Level entry     Home Layout: One level Home Equipment: Walker - 2 wheels;Cane - single point;Bedside commode      Prior Function Level of Independence: Independent with assistive device(s);Needs assistance   Gait / Transfers Assistance Needed: used cane at times PTA  ADL's / Homemaking Assistance Needed: sponge bathes  Comments: daughter assist for transportation/IADL's; she works 3rd shift     Journalist, newspaper        Extremity/Trunk Assessment   Upper Extremity Assessment Upper Extremity Assessment: Overall WFL for tasks assessed    Lower Extremity Assessment Lower Extremity Assessment: LLE deficits/detail;RLE deficits/detail RLE  Deficits / Details: AROM WFL, strength hip flexion 3/5, knee extension 4/5 LLE Deficits / Details: AAROM limited knee flexion due to pain about 80 degrees, strength hip flexion 2/5, knee extension 3-/5       Communication   Communication: No difficulties  Cognition Arousal/Alertness: Awake/alert Behavior During Therapy: Impulsive Overall Cognitive Status: No family/caregiver present to determine baseline cognitive functioning                                        General Comments      Exercises     Assessment/Plan    PT Assessment Patient needs continued PT services  PT Problem List Decreased strength;Decreased mobility;Decreased activity tolerance;Decreased balance;Decreased knowledge of use of DME;Pain;Decreased safety awareness       PT Treatment Interventions DME instruction;Gait training;Therapeutic exercise;Therapeutic activities;Patient/family education;Functional mobility training    PT Goals (Current goals can be found in the Care Plan section)  Acute Rehab PT Goals Patient Stated Goal: to go home PT Goal Formulation: With patient Time For Goal Achievement: 07/23/18 Potential to Achieve Goals: Fair    Frequency Min 3X/week   Barriers to discharge        Co-evaluation               AM-PAC PT "6 Clicks" Daily Activity  Outcome Measure Difficulty turning over in bed (including adjusting bedclothes, sheets and blankets)?: Unable Difficulty moving from lying on back to sitting on the side of the bed? : Unable Difficulty sitting down on and standing up from a chair with arms (e.g., wheelchair, bedside commode, etc,.)?: Unable Help needed moving to and from a bed to chair (including a wheelchair)?: A Little Help needed walking in hospital room?: A Lot Help needed climbing 3-5 steps with a railing? : Total 6 Click Score: 9    End of Session Equipment Utilized During Treatment: Gait belt Activity Tolerance: Patient limited by pain Patient  left: in bed;with call bell/phone within reach;with bed alarm set Nurse Communication: Mobility status PT Visit Diagnosis: Other abnormalities of gait and mobility (R26.89);History of falling (Z91.81);Pain;Muscle weakness (generalized) (M62.81) Pain - Right/Left: Left Pain - part of body: Leg    Time: 4010-2725 PT Time Calculation (min) (ACUTE ONLY): 31 min   Charges:   PT Evaluation $PT Eval Moderate Complexity: 1 Mod PT Treatments $Therapeutic Activity: 8-22 mins        Andrew [name], [place] [facility] [phone] 07/09/2018   Reginia Naas 07/09/2018, 4:50 PM

## 2018-07-10 ENCOUNTER — Telehealth: Payer: Self-pay | Admitting: Oncology

## 2018-07-10 DIAGNOSIS — E440 Moderate protein-calorie malnutrition: Secondary | ICD-10-CM

## 2018-07-10 DIAGNOSIS — L8990 Pressure ulcer of unspecified site, unspecified stage: Secondary | ICD-10-CM

## 2018-07-10 LAB — BPAM PLATELET PHERESIS
BLOOD PRODUCT EXPIRATION DATE: 201910170517
Blood Product Expiration Date: 201910172359
Blood Product Expiration Date: 201910172359
ISSUE DATE / TIME: 201910152322
ISSUE DATE / TIME: 201910160201
ISSUE DATE / TIME: 201910160526
UNIT TYPE AND RH: 5100
UNIT TYPE AND RH: 5100
UNIT TYPE AND RH: 5100

## 2018-07-10 LAB — PREPARE PLATELET PHERESIS
UNIT DIVISION: 0
Unit division: 0
Unit division: 0

## 2018-07-10 LAB — COMPREHENSIVE METABOLIC PANEL
ALT: 30 U/L (ref 0–44)
AST: 20 U/L (ref 15–41)
Albumin: 1.9 g/dL — ABNORMAL LOW (ref 3.5–5.0)
Alkaline Phosphatase: 192 U/L — ABNORMAL HIGH (ref 38–126)
Anion gap: 8 (ref 5–15)
BUN: 12 mg/dL (ref 8–23)
CHLORIDE: 102 mmol/L (ref 98–111)
CO2: 24 mmol/L (ref 22–32)
Calcium: 8.8 mg/dL — ABNORMAL LOW (ref 8.9–10.3)
Creatinine, Ser: 0.76 mg/dL (ref 0.61–1.24)
Glucose, Bld: 134 mg/dL — ABNORMAL HIGH (ref 70–99)
POTASSIUM: 4.6 mmol/L (ref 3.5–5.1)
Sodium: 134 mmol/L — ABNORMAL LOW (ref 135–145)
Total Bilirubin: 0.9 mg/dL (ref 0.3–1.2)
Total Protein: 6.8 g/dL (ref 6.5–8.1)

## 2018-07-10 LAB — GLUCOSE, CAPILLARY
GLUCOSE-CAPILLARY: 133 mg/dL — AB (ref 70–99)
GLUCOSE-CAPILLARY: 114 mg/dL — AB (ref 70–99)
GLUCOSE-CAPILLARY: 109 mg/dL — AB (ref 70–99)
Glucose-Capillary: 121 mg/dL — ABNORMAL HIGH (ref 70–99)

## 2018-07-10 LAB — CBC
HEMATOCRIT: 22.2 % — AB (ref 39.0–52.0)
HEMOGLOBIN: 7.1 g/dL — AB (ref 13.0–17.0)
MCH: 28.3 pg (ref 26.0–34.0)
MCHC: 32 g/dL (ref 30.0–36.0)
MCV: 88.4 fL (ref 80.0–100.0)
Platelets: 64 10*3/uL — ABNORMAL LOW (ref 150–400)
RBC: 2.51 MIL/uL — ABNORMAL LOW (ref 4.22–5.81)
RDW: 13.4 % (ref 11.5–15.5)
WBC: 0.3 10*3/uL — AB (ref 4.0–10.5)
nRBC: 0 % (ref 0.0–0.2)

## 2018-07-10 LAB — PROCALCITONIN: PROCALCITONIN: 0.16 ng/mL

## 2018-07-10 NOTE — Care Management Important Message (Signed)
Important Message  Patient Details  Name: TALLEY KREISER MRN: [id number] Date of Birth: 01-25-1951   Medicare Important Message Given:  No  Patient was not able to sign due to illness/Unsigned copy left    Kristin Barcus 07/10/2018, 3:51 PM

## 2018-07-10 NOTE — Telephone Encounter (Signed)
Appts scheduled patient notified per 10/17 sch msg

## 2018-07-10 NOTE — Progress Notes (Signed)
IP PROGRESS NOTE  Subjective:   [name] has no new complaint.  The left leg discomfort is better.  Objective: Vital signs in last 24 hours: Blood pressure (!) 110/55, pulse 84, temperature 100.1 F (37.8 C), temperature source Oral, resp. rate 18, height '5\' 7"'$  (1.702 m), weight 146 lb 9.7 oz (66.5 kg), SpO2 98 %.  Intake/Output from previous day: 10/16 0701 - 10/17 0700 In: 852 [P.O.:420; Blood:432] Out: 925 [Urine:925]  Physical Exam:  HEENT: Thick white coat over the tongue-improved, no oral bleeding Cardiovascular: Regular rate and rhythm Lungs: Bilateralwheeze, no respiratory distress Abdomen: Nontender, no hepatosplenomegaly Extremities: No leg edema Rectal: Stool covering the perianal ulcers  Portacath/PICC-without erythema  Lab Results: Recent Labs    07/09/18 1247 07/10/18 0343  WBC 0.3* 0.3*  HGB 7.8* 7.1*  HCT 22.5* 22.2*  PLT 89* 64*    BMET Recent Labs    07/09/18 0422 07/10/18 0343  NA 133* 134*  K 3.0* 4.6  CL 100 102  CO2 23 24  GLUCOSE 148* 134*  BUN 12 12  CREATININE 0.73 0.76  CALCIUM 8.7* 8.8*    No results found for: CEA1  Studies/Results: Ct Bone Marrow Biopsy & Aspiration  Result Date: 07/09/2018 INDICATION: 67 year old male with pancytopenia EXAM: CT GUIDED BONE MARROW ASPIRATION AND CORE BIOPSY Interventional Radiologist:  Criselda Peaches, MD MEDICATIONS: None. ANESTHESIA/SEDATION: Moderate (conscious) sedation was employed during this procedure. A total of 1 milligrams versed and 50 micrograms fentanyl were administered intravenously. The patient's level of consciousness and vital signs were monitored continuously by radiology nursing throughout the procedure under my direct supervision. Total monitored sedation time: 14 minutes FLUOROSCOPY TIME:  None COMPLICATIONS: None immediate. Estimated blood loss: <25 mL PROCEDURE: Informed written consent was obtained from the patient after a thorough discussion of the procedural risks,  benefits and alternatives. All questions were addressed. Maximal Sterile Barrier Technique was utilized including caps, mask, sterile gowns, sterile gloves, sterile drape, hand hygiene and skin antiseptic. A timeout was performed prior to the initiation of the procedure. The patient was positioned prone and non-contrast localization CT was performed of the pelvis to demonstrate the iliac marrow spaces. Maximal barrier sterile technique utilized including caps, mask, sterile gowns, sterile gloves, large sterile drape, hand hygiene, and betadine prep. Under sterile conditions and local anesthesia, an 11 gauge coaxial bone biopsy needle was advanced into the right iliac marrow space. Needle position was confirmed with CT imaging. Initially, bone marrow aspiration was performed. Next, the 11 gauge outer cannula was utilized to obtain a right iliac bone marrow core biopsy. Needle was removed. Hemostasis was obtained with compression. The patient tolerated the procedure well. Samples were prepared with the cytotechnologist. IMPRESSION: Technically successful CT-guided bone marrow biopsy and aspiration. Electronically Signed   By: Jacqulynn Cadet M.D.   On: 07/09/2018 11:10    Medications: I have reviewed the patient's current medications.  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  Cycle 1 5-azacytidine 05/06/2018  Cycle25-azacytidine 06/16/2018 2.Renal insufficiency 3.Diabetes 4.Hemoccult positive stool  Upper endoscopy 04/03/2018-chronic gastritis, duodenitis  Colonoscopy 04/04/2018-diverticulosis in the sigmoid colon 5.Right lower lobe 7 mm subpleural nodule on chest CT  04/02/2018 6.Admission with febrile neutropenia 05/20/2018- placed on cefepime, antibiotics changed to vancomycin/meropenem on 05/21/2018, anidulafungin added 9/1/2019antibiotics stopped 05/31/2018, discharge 06/03/2018 with prophylactic acyclovir,  Bactrim, and Diflucan. Bactrim discontinued and Levaquin started 06/05/2018 7. 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 Febrile reaction following a platelet transfusion 05/29/2018, 06/05/2018, 07/05/2018 11.Perineal ulceration versus fistulae-started sitz baths 06/23/2018  CT 07/07/2018-perianal abscess 12.  Weight loss 13.  Admission with fever and severe neutropenia 07/07/2018   [name] appears stable.  He has low-grade fevers, but he has not experienced a high fever spike over the past few days.  Blood and urine cultures remain negative.  The platelet count remains increased following the transfusions yesterday.  I discussed the bone marrow biopsy findings with Dr. Gari Crown.  The bone marrow is markedly hypocellular with decreased myeloid elements.  No blast population.  He feels this is likely related to a treatment effect. [name] is now at day 25 following cycle 2 5 azacytidine.  I discussed the case with Dr. Florene Glen on the leukemia service at [facility].  He has evaluated [name] within the past few weeks.  He is encouraged the bone marrow is now hypocellular and is hopeful for hematologic recovery over the next several weeks.  He recommends continuing supportive care and holding the next cycle of 5 azacytidine.    Recommendations: 1.  Continue antibiotics for the perianal abscess and acyclovir/fluconazole prophylaxis 2.  Transfuse platelets for a count of less than 5000 or bleeding 3.  Okay for discharge to home if he remains stable and without  a fever spike over the next 24 hours 4.  Please call hematology as needed on 07/11/2018  Outpatient follow-up as scheduled at the Cancer center for 07/14/2018.     LOS: 3 days   Betsy Coder, MD   07/10/2018, 6:32 AM

## 2018-07-10 NOTE — Progress Notes (Signed)
PROGRESS NOTE                                                                                                                                                                                                             Patient Demographics:    Andrew [name], is a 67 y.o. male, DOB - [birth year], HWK:[id number]  Admit date - 07/07/2018   Admitting Physician Merton Border, MD  Outpatient Primary MD for the patient is [name], MD  LOS - 3  Chief Complaint  Patient presents with  . 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  He is followed by Dr. Betsy Coder, and has undergone 2 rounds of chemotherapy which have been ineffective.    Been struggling with rectal wounds for the last several weeks and is being treated outpatient by his PCP, he continues to be dependent on red cell and platelet transfusions.  He was admitted with sepsis due to a perianal abscess.   Subjective:   Patient in bed, appears comfortable, denies any headache, no fever, no chest pain or pressure, no shortness of breath , no abdominal pain. No focal weakness.    Assessment  & Plan :     1.  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  Seen by general surgery, conservative management for now, 24 hours of IV antibiotics, sepsis seems to have defervesced for now, continue to monitor closely in stepdown due to severe underlying pancytopenia, so far blood cultures negative.  Is been switched to oral antibiotics and remains afebrile, continue to monitor, if cultures remain negative he remained stable likely discharge tomorrow.  2. 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 Annitta Jersey patient's oncologist he is on board, bone marrow biopsy was done on  07/09/2018 await results per hematology if there is acute leukemia he will be transferred to [facility] today, platelets only if needed for surgery should be crossmatched, he has history of fevers with platelet transfusions.  Will defer management to heme-onc.  Currently on prophylactic acyclovir and doxycycline along with Augmentin.  3.  Hypertension.  Currently on beta-blocker and stable.  4.  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  5.  GERD.  On PPI.  6.  COPD.  At baseline no acute issues.    7. DM type II.  On sliding scale we  will continue to monitor.  Lab Results  Component Value Date   HGBA1C 7.3 (H) 04/02/2018   CBG (last 3)  Recent Labs    07/09/18 1709 07/09/18 2148 07/10/18 0835  GLUCAP 126* 211* 121*     Family Communication  :  None  Code Status :  Full  Disposition Plan  :  Med - DC in am if afebrile  Consults  :  Haem, CCS  Procedures  :    Bone marrow biospy -   DVT Prophylaxis  :    SCDs    Lab Results  Component Value Date   PLT 64 (L) 07/10/2018    Diet :  Diet Order            Diet regular Room service appropriate? Yes; Fluid consistency: Thin  Diet effective now               Inpatient Medications Scheduled Meds: . sodium chloride   Intravenous Once  . acyclovir  400 mg Oral BID  . alteplase  2 mg Intracatheter Once  . amoxicillin-clavulanate  1 tablet Oral Q12H  . chlorhexidine  15 mL Mouth/Throat BID  . dorzolamide-timolol  1 drop Both Eyes BID  . doxycycline  100 mg Oral Q12H  . feeding supplement (ENSURE ENLIVE)  237 mL Oral TID BM  . fluconazole  100 mg Oral Daily  . gemfibrozil  600 mg Oral BID  . Influenza vac split quadrivalent PF  0.5 mL Intramuscular Tomorrow-1000  . insulin aspart  0-5 Units Subcutaneous QHS  . insulin aspart  0-9 Units Subcutaneous TID WC  . latanoprost  1 drop Both Eyes QHS  . metoprolol tartrate  25 mg Oral BID  . pantoprazole  40 mg Oral BID  . pneumococcal 23 valent vaccine  0.5 mL  Intramuscular Tomorrow-1000   Continuous Infusions:  PRN Meds:.acetaminophen, ipratropium-albuterol, morphine injection, [DISCONTINUED] ondansetron **OR** ondansetron (ZOFRAN) IV, sodium chloride flush, traMADol  Antibiotics  :   Anti-infectives (From admission, onward)   Start     Dose/Rate Route Frequency Ordered Stop   07/09/18 1100  amoxicillin-clavulanate (AUGMENTIN) 875-125 MG per tablet 1 tablet     1 tablet Oral Every 12 hours 07/09/18 0947     07/09/18 1000  fluconazole (DIFLUCAN) tablet 100 mg     100 mg Oral Daily 07/09/18 0705     07/09/18 1000  doxycycline (VIBRA-TABS) tablet 100 mg     100 mg Oral Every 12 hours 07/09/18 0947     07/08/18 0900  piperacillin-tazobactam (ZOSYN) IVPB 3.375 g  Status:  Discontinued     3.375 g 12.5 mL/hr over 240 Minutes Intravenous Every 8 hours 07/07/18 1737 07/09/18 0947   07/08/18 0800  vancomycin (VANCOCIN) 500 mg in sodium chloride 0.9 % 100 mL IVPB  Status:  Discontinued     500 mg 100 mL/hr over 60 Minutes Intravenous Every 12 hours 07/07/18 1737 07/09/18 0947   07/08/18 0100  piperacillin-tazobactam (ZOSYN) IVPB 3.375 g     3.375 g 100 mL/hr over 30 Minutes Intravenous  Once 07/07/18 1729 07/08/18 0153   07/07/18 2200  acyclovir (ZOVIRAX) tablet 400 mg     400 mg Oral 2 times daily 07/07/18 1930     07/07/18 1945  fluconazole (DIFLUCAN) tablet 50 mg  Status:  Discontinued     50 mg Oral Daily 07/07/18 1930 07/07/18 1950   07/07/18 1945  metroNIDAZOLE (FLAGYL) IVPB 500 mg  Status:  Discontinued     500 mg  100 mL/hr over 60 Minutes Intravenous Every 8 hours 07/07/18 1930 07/07/18 1952   07/07/18 1445  metroNIDAZOLE (FLAGYL) IVPB 500 mg  Status:  Discontinued     500 mg 100 mL/hr over 60 Minutes Intravenous Every 8 hours 07/07/18 1430 07/07/18 1734   07/07/18 1445  ceFEPIme (MAXIPIME) 2 g in sodium chloride 0.9 % 100 mL IVPB     2 g 200 mL/hr over 30 Minutes Intravenous  Once 07/07/18 1442 07/07/18 1622   07/07/18 1445  vancomycin  (VANCOCIN) 1,500 mg in sodium chloride 0.9 % 500 mL IVPB     1,500 mg 250 mL/hr over 120 Minutes Intravenous  Once 07/07/18 1442 07/07/18 2008          Objective:   Vitals:   07/10/18 0051 07/10/18 0415 07/10/18 0652 07/10/18 0806  BP: (!) 93/58 (!) 110/55    Pulse: 77 84    Resp: (!) 26 18    Temp: 99.3 F (37.4 C) 100.1 F (37.8 C) 98.1 F (36.7 C) 97.7 F (36.5 C)  TempSrc: Oral Oral Oral Oral  SpO2: 96% 98%    Weight:      Height:        Wt Readings from Last 3 Encounters:  07/07/18 66.5 kg  07/04/18 65.5 kg  06/25/18 70 kg     Intake/Output Summary (Last 24 hours) at 07/10/2018 1131 Last data filed at 07/10/2018 0900 Gross per 24 hour  Intake 740 ml  Output 300 ml  Net 440 ml     Physical Exam  Awake Alert, Oriented X 3, No new F.N deficits, Normal affect [place].AT,PERRAL Supple Neck,No JVD, No cervical lymphadenopathy appriciated.  Symmetrical Chest wall movement, Good air movement bilaterally, CTAB RRR,No Gallops, Rubs or new Murmurs, No Parasternal Heave +ve B.Sounds, Abd Soft, No tenderness, No organomegaly appriciated, No rebound - guarding or rigidity. No Cyanosis, Clubbing or edema, No new Rash or bruise    Data Review:    CBC Recent Labs  Lab 07/04/18 0812 07/07/18 1442 07/08/18 0937 07/09/18 1247 07/10/18 0343  WBC 0.4* 0.4* 0.3* 0.3* 0.3*  HGB 8.1* 7.4* 8.1* 7.8* 7.1*  HCT 24.3* 22.8* 24.5* 22.5* 22.2*  PLT 5* 16* 11* 89* 64*  MCV 89.3 89.4 86.6 85.2 88.4  MCH 29.8 29.0 28.6 29.5 28.3  MCHC 33.3 32.5 33.1 34.7 32.0  RDW 12.8 12.1 14.0 13.6 13.4  LYMPHSABS 0.3* 0.3*  --  0.2*  --   MONOABS 0.0* 0.0*  --  0.0*  --   EOSABS 0.0 0.0  --  0.0  --   BASOSABS 0.0 0.0  --  0.0  --     Chemistries  Recent Labs  Lab 07/07/18 1442 07/08/18 0937 07/09/18 0422 07/10/18 0343  NA 131* 135 133* 134*  K 3.8 4.0 3.0* 4.6  CL 98 103 100 102  CO2 '22 23 23 24  '$ GLUCOSE 153* 124* 148* 134*  BUN 34* '20 12 12  '$ CREATININE 1.58* 0.84 0.73  0.76  CALCIUM 8.8* 8.8* 8.7* 8.8*  MG  --  2.0  --   --   AST 27 14* 21 20  ALT '31 24 31 30  '$ ALKPHOS 161* 139* 173* 192*  BILITOT 0.7 2.0* 1.1 0.9   ------------------------------------------------------------------------------------------------------------------ No results for input(s): CHOL, HDL, LDLCALC, TRIG, CHOLHDL, LDLDIRECT in the last 72 hours.  Lab Results  Component Value Date   HGBA1C 7.3 (H) 04/02/2018   ------------------------------------------------------------------------------------------------------------------ No results for input(s): TSH, T4TOTAL, T3FREE, THYROIDAB in the last 72 hours.  Invalid input(s): FREET3 ------------------------------------------------------------------------------------------------------------------ No results for input(s): VITAMINB12, FOLATE, FERRITIN, TIBC, IRON, RETICCTPCT in the last 72 hours.  Coagulation profile No results for input(s): INR, PROTIME in the last 168 hours.  No results for input(s): DDIMER in the last 72 hours.  Cardiac Enzymes Recent Labs  Lab 07/07/18 1955 07/08/18 0937  TROPONINI <0.03 <0.03   ------------------------------------------------------------------------------------------------------------------ No results found for: BNP  Micro Results Recent Results (from the past 240 hour(s))  Blood Culture (routine x 2)     Status: None (Preliminary result)   Collection Time: 07/07/18  2:35 PM  Result Value Ref Range Status   Specimen Description BLOOD RIGHT ARM  Final   Special Requests   Final    BOTTLES DRAWN AEROBIC AND ANAEROBIC Blood Culture adequate volume   Culture   Final    NO GROWTH 3 DAYS Performed at [facility], [place] [address] [place], [place] [address]    Report Status PENDING  Incomplete  Urine culture     Status: None   Collection Time: 07/07/18  3:42 PM  Result Value Ref Range Status   Specimen Description URINE, CLEAN CATCH  Final   Special Requests NONE  Final    Culture   Final    NO GROWTH Performed at [facility], [place] [address] [place], [place] [address]    Report Status 07/08/2018 FINAL  Final  Culture, blood (Routine X 2) w Reflex to ID Panel     Status: None (Preliminary result)   Collection Time: 07/08/18  9:35 AM  Result Value Ref Range Status   Specimen Description BLOOD LEFT ANTECUBITAL  Final   Special Requests   Final    BOTTLES DRAWN AEROBIC AND ANAEROBIC Blood Culture adequate volume   Culture   Final    NO GROWTH 2 DAYS Performed at [facility], [place] [address], [place], [place] [address]    Report Status PENDING  Incomplete  MRSA PCR Screening     Status: None   Collection Time: 07/09/18 12:47 PM  Result Value Ref Range Status   MRSA by PCR NEGATIVE NEGATIVE Final    Comment:        The GeneXpert MRSA Assay (FDA approved for NASAL specimens only), is one component of a comprehensive MRSA colonization surveillance program. It is not intended to diagnose MRSA infection nor to guide or monitor treatment for MRSA infections. Performed at [facility], [place] [address], [place],  [address]     Radiology Reports Ct Abdomen Pelvis W Contrast  Result Date: 07/07/2018 CLINICAL DATA:  Recent onset of weakness. Opened wound of the anus. History of COPD, diabetes, hypertension and myelodysplasia. EXAM: CT ABDOMEN AND PELVIS WITH CONTRAST TECHNIQUE: Multidetector CT imaging of the abdomen and pelvis was performed using the standard protocol following bolus administration of intravenous contrast. CONTRAST:  127m OMNIPAQUE IOHEXOL 300 MG/ML  SOLN COMPARISON:  Abdominopelvic CT 04/02/2018. FINDINGS: Lower chest: Images through the lung bases are degraded by breathing artifact. Subpleural right lower lobe nodule measuring 7 mm on image 20/4 similar to the previous study. There is a new 7 mm right lower lobe nodule on image 20/4 as well as a new 6 mm right lower lobe nodule on image 11/4. The heart is enlarged.  No significant pleural or pericardial effusion. Hepatobiliary: The liver is normal in density without focal abnormality. No evidence of gallstones, gallbladder wall thickening or biliary dilatation. Pancreas: Unremarkable. No pancreatic ductal dilatation or surrounding inflammatory changes.  Spleen: Normal in size without focal abnormality. Adrenals/Urinary Tract: Both adrenal glands appear normal. There is a stable tiny nonobstructing calculus in the lower pole of the left kidney, best seen on sagittal image 75/8. No evidence of ureteral calculus, hydronephrosis or perinephric soft tissue stranding. Probable tiny cyst anteriorly in the interpolar region of the left kidney. The bladder appears unremarkable. Stomach/Bowel: No evidence of bowel wall thickening, distention or surrounding inflammatory change. The appendix appears normal. There is moderate stool throughout the colon. There is a perianal air-fluid collection measuring up to 3.4 x 1.9 cm on axial image 100/3, likely reflecting a fistula and abscess. This is best demonstrated on sagittal images 55 through 60/8 and demonstrates surrounding enhancement. No evidence of intrapelvic abscess. Vascular/Lymphatic: There are no enlarged abdominal or pelvic lymph nodes. Small inguinal lymph nodes are likely reactive. Moderate aortic and branch vessel atherosclerosis. Reproductive: Stable mild enlargement of the prostate gland. Other: Small umbilical hernia containing only fat. No ascites, free air or intra-abdominal abscess. Musculoskeletal: No acute or significant osseous findings. IMPRESSION: 1. New perianal air-fluid collection with surrounding enhancement consistent with fistula and perianal abscess. 2. No evidence of bowel obstruction or intra-abdominal abscess. 3. Increased nodularity at the right lung base since recent CT of 3 months ago, possibly inflammatory. Follow-up chest CT in 3-6 months recommended to exclude the possibility of metastatic disease. 4.   Aortic Atherosclerosis (ICD10-I70.0). Electronically Signed   By: Richardean Sale M.D.   On: 07/07/2018 19:31   Dg Chest Portable 1 View  Result Date: 07/07/2018 CLINICAL DATA:  Patient fell in a parking lot, possible syncopal episode. The patient is shivering and the skin is hot to the touch. History of COPD-asthma, former smoker, myelodysplastic syndrome. EXAM: PORTABLE CHEST 1 VIEW COMPARISON:  PA and lateral chest x-ray of May 24, 2018. FINDINGS: The lungs are adequately inflated. The interstitial markings are coarse and more conspicuous than on the previous study. The heart is top-normal in size. The pulmonary vascularity is not clearly engorged. There is calcification in the wall of the thoracic aorta. The right-sided PICC line tip terminates at the junction of the middle and distal thirds of the SVC. There is no pleural effusion. The observed bony thorax exhibits no acute abnormality. IMPRESSION: Mildly increased lung markings bilaterally may reflect interstitial edema of cardiac or noncardiac cause. Interstitial pneumonia is felt less likely. There is underlying COPD. Thoracic aortic atherosclerosis. Electronically Signed   By: David  Martinique M.D.   On: 07/07/2018 15:15   Ct Bone Marrow Biopsy & Aspiration  Result Date: 07/09/2018 INDICATION: 67 year old male with pancytopenia EXAM: CT GUIDED BONE MARROW ASPIRATION AND CORE BIOPSY Interventional Radiologist:  Criselda Peaches, MD MEDICATIONS: None. ANESTHESIA/SEDATION: Moderate (conscious) sedation was employed during this procedure. A total of 1 milligrams versed and 50 micrograms fentanyl were administered intravenously. The patient's level of consciousness and vital signs were monitored continuously by radiology nursing throughout the procedure under my direct supervision. Total monitored sedation time: 14 minutes FLUOROSCOPY TIME:  None COMPLICATIONS: None immediate. Estimated blood loss: <25 mL PROCEDURE: Informed written consent was  obtained from the patient after a thorough discussion of the procedural risks, benefits and alternatives. All questions were addressed. Maximal Sterile Barrier Technique was utilized including caps, mask, sterile gowns, sterile gloves, sterile drape, hand hygiene and skin antiseptic. A timeout was performed prior to the initiation of the procedure. The patient was positioned prone and non-contrast localization CT was performed of the pelvis to demonstrate the iliac marrow spaces. Maximal  barrier sterile technique utilized including caps, mask, sterile gowns, sterile gloves, large sterile drape, hand hygiene, and betadine prep. Under sterile conditions and local anesthesia, an 11 gauge coaxial bone biopsy needle was advanced into the right iliac marrow space. Needle position was confirmed with CT imaging. Initially, bone marrow aspiration was performed. Next, the 11 gauge outer cannula was utilized to obtain a right iliac bone marrow core biopsy. Needle was removed. Hemostasis was obtained with compression. The patient tolerated the procedure well. Samples were prepared with the cytotechnologist. IMPRESSION: Technically successful CT-guided bone marrow biopsy and aspiration. Electronically Signed   By: Jacqulynn Cadet M.D.   On: 07/09/2018 11:10    Time Spent in minutes  30   Lala Lund M.D on 07/10/2018 at 11:31 AM  To page go to [email or web address] - password [facility]

## 2018-07-10 NOTE — Progress Notes (Signed)
Hydrotherapy Cancellation Note  Patient Details Name: CLETUS PARIS MRN: [id number] DOB: 17-Mar-1951   Cancelled Treatment:    Reason Eval/Treat Not Completed: Other (comment). Pt had recently had dressing changed after bowel movement and did not want anything else done to wound.   Shary Decamp [facility] 07/10/2018, 12:27 PM  [place] Pager [phone] Office [phone]

## 2018-07-10 NOTE — Progress Notes (Signed)
Physical Therapy Treatment Patient Details Name: Andrew [name] MRN: [id number] DOB: [birth year] Today's Date: 07/10/2018    History of Present Illness Andrew [name]  is a 67 y.o. male, 67 year old male presenting with 1 week history of fever weakness that has been getting worse.  Patient has a history of myelodysplastic disorder/with multilineage dysplasia being followed by Dr. Learta Codding, renal insufficiency, diabetes mellitus and right buttock ulcer/wound being followed by his primary medical doctor.    PT Comments    progressing  Slowly d/t pain, would benefit from SNF however refuses at time of PT today; able to perform bed to chair transfer with min/guard and excessive time, unable/unwilling to attempt gait or standing d/t LLE pain  Follow Up Recommendations  Home health PT;Supervision for mobility/OOB(needs SNF, currently refusing; [place] aide if possible)     Equipment Recommendations  Wheelchair cushion (measurements PT);Wheelchair (measurements PT)    Recommendations for Other Services       Precautions / Restrictions Precautions Precautions: Fall Precaution Comments: fell two days ago (PTA) Restrictions Weight Bearing Restrictions: No    Mobility  Bed Mobility Overal bed mobility: Needs Assistance Bed Mobility: Supine to Sit     Supine to sit: Supervision     General bed mobility comments: pt today requires supervision for lines and safety as well as an excessive amount of time to perform task  Transfers Overall transfer level: Needs assistance   Transfers: Lateral/Scoot Transfers          Lateral/Scoot Transfers: Min guard General transfer comment: pt refuses to attempt standing d/t LE pain, lateral scooting bed to chair with min/guard for safety, cues for positioning in chair  Ambulation/Gait             General Gait Details: deferred due to pt c/o L LE pain--pt refused to attempt   Stairs             Wheelchair Mobility    Modified Rankin  (Stroke Patients Only)       Balance                                            Cognition Arousal/Alertness: Awake/alert Behavior During Therapy: Flat affect Overall Cognitive Status: No family/caregiver present to determine baseline cognitive functioning                                 General Comments: pt requires incr time to complete tasks/follow commands and is mildly agitated by PT, states I am asking him to do too much and asking too many questions      Exercises      General Comments        Pertinent Vitals/Pain Pain Assessment: Faces Faces Pain Scale: Hurts whole lot Pain Location: L LE with movement and weight bearing Pain Descriptors / Indicators: Guarding;Grimacing;Aching Pain Intervention(s): Limited activity within patient's tolerance;Monitored during session;Repositioned    Home Living                      Prior Function            PT Goals (current goals can now be found in the care plan section) Acute Rehab PT Goals Patient Stated Goal: to go home PT Goal Formulation: With patient Time For Goal Achievement: 07/23/18 Potential to Achieve Goals: Fair  Progress towards PT goals: Progressing toward goals(slowly d/t pain)    Frequency    Min 3X/week      PT Plan Current plan remains appropriate    Co-evaluation              AM-PAC PT "6 Clicks" Daily Activity  Outcome Measure  Difficulty turning over in bed (including adjusting bedclothes, sheets and blankets)?: A Lot Difficulty moving from lying on back to sitting on the side of the bed? : A Lot Difficulty sitting down on and standing up from a chair with arms (e.g., wheelchair, bedside commode, etc,.)?: Unable Help needed moving to and from a bed to chair (including a wheelchair)?: A Little Help needed walking in hospital room?: Total Help needed climbing 3-5 steps with a railing? : Total 6 Click Score: 10    End of Session   Activity  Tolerance: Patient limited by pain Patient left: in chair;with call bell/phone within reach(no bed alarm on arrrival)   PT Visit Diagnosis: Other abnormalities of gait and mobility (R26.89);History of falling (Z91.81);Pain;Muscle weakness (generalized) (M62.81) Pain - Right/Left: Left Pain - part of body: Leg     Time: 3491-7915 PT Time Calculation (min) (ACUTE ONLY): 26 min  Charges:  $Therapeutic Activity: 23-37 mins                     Kenyon Ana, PT  Pager: [phone] Acute Rehab Dept [facility]): [phone]   07/10/2018    [facility] 07/10/2018, 10:03 AM

## 2018-07-11 LAB — GLUCOSE, CAPILLARY
GLUCOSE-CAPILLARY: 117 mg/dL — AB (ref 70–99)
Glucose-Capillary: 122 mg/dL — ABNORMAL HIGH (ref 70–99)

## 2018-07-11 MED ORDER — HEPARIN SOD (PORK) LOCK FLUSH 100 UNIT/ML IV SOLN
250.0000 [IU] | INTRAVENOUS | Status: AC | PRN
  Administered 2018-07-11: 250 [IU]

## 2018-07-11 MED ORDER — AMOXICILLIN-POT CLAVULANATE 875-125 MG PO TABS: 1.0000 | ORAL_TABLET | Freq: Two times a day (BID) | ORAL | 0 refills | Status: DC

## 2018-07-11 MED ORDER — DOXYCYCLINE HYCLATE 100 MG PO TABS: 100.0000 mg | ORAL_TABLET | Freq: Two times a day (BID) | ORAL | 0 refills | Status: DC

## 2018-07-11 NOTE — Progress Notes (Signed)
PT Cancellation Note  Patient Details Name: Andrew [name] MRN: [id number] DOB: August 07, 1951   Cancelled Treatment:    Reason Eval/Treat Not Completed: Other (comment). Pt reports he is going home and that his dressing has been changed already. Does not wish to have hydrotherapy prior to dc.   Shary Decamp [facility] 07/11/2018, 3:26 PM [place] Pager [phone] Office [phone]

## 2018-07-11 NOTE — Discharge Summary (Signed)
Andrew [name] XIP:[id number] DOB: 1950-12-09 DOA: 07/07/2018  PCP: [name], MD  Admit date: 07/07/2018  Discharge date: 07/11/2018  Admitted From: Home   Disposition:  Home   Recommendations for Outpatient Follow-up:   Follow up with PCP in 1-2 weeks  PCP Please obtain BMP/CBC, 2 view CXR in 1week,  (see Discharge instructions)   PCP Please follow up on the following pending results:    Home Health: PT,RN   Equipment/Devices: None  Consultations: Onco, CCS Discharge Condition: Fair   CODE STATUS: Ful   Diet Recommendation: Heart Healthy     Chief Complaint  Patient presents with  . 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 Conrad Blossom hasundergone 2 rounds of chemotherapy which have been ineffective.   Been struggling with rectal wounds for the last several weeks and is being treated outpatient by his PCP, he continues to be dependent on red cell and platelet transfusions. He was admitted with sepsis due to a perianal abscess.                                                                 Hospital Course    1.  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  Seen by general surgery, conservative management for now, 24 hours of IV antibiotics, sepsis seems to have defervesced for now, continue to monitor closely in stepdown due to severe underlying pancytopenia, so far blood cultures negative.  We will switch to combination of oral Augmentin, doxycycline along with acyclovir or prophylactic along with therapeutic for his chronic rectal abscess, cultures have  remained negative and he has remained afebrile, will be discharged on 10 more days of antibiotics thereafter follow with PCP and primary oncologist in the office, acyclovir continued indefinitely.  2. 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 Annitta Jersey within a week.  3.  Hypertension.  Currently on beta-blocker and stable.  4.  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  5.  GERD.  On PPI.  6.  COPD.  At baseline no acute issues.    7. DM type II.    Resume home regimen upon discharge.   Discharge diagnosis     Principal Problem:   Sepsis ([place]) Active  Problems:   COPD (chronic obstructive pulmonary disease) (HCC)   Myelodysplasia (myelodysplastic syndrome) (HCC)   Febrile neutropenia (HCC)   Rectal abscess   Malnutrition of moderate degree   Pressure injury of skin    Discharge instructions    Discharge Instructions    Diet - low sodium heart healthy   Complete by:  As directed    Discharge instructions   Complete by:  As directed    Follow with Primary MD [name], MD in 3 days   Get CBC, CMP checked  by Primary MD  in 3 days    Activity: As tolerated with Full fall precautions use walker/cane & assistance as needed  Disposition Home   Diet: Heart Healthy     Special Instructions: If you have smoked or chewed Tobacco  in the last 2 yrs please stop smoking, stop any regular Alcohol  and or any Recreational drug use.  On your next visit with your primary care physician please Get Medicines reviewed and  adjusted.  Please request your Prim.MD to go over all Hospital Tests and Procedure/Radiological results at the follow up, please get all Hospital records sent to your Prim MD by signing hospital release before you go home.  If you experience worsening of your admission symptoms, develop shortness of breath, life threatening emergency, suicidal or homicidal thoughts you must seek medical attention immediately by calling 911 or calling your MD immediately  if symptoms less severe.  You Must read complete instructions/literature along with all the possible adverse reactions/side effects for all the Medicines you take and that have been prescribed to you. Take any new Medicines after you have completely understood and accpet all the possible adverse reactions/side effects.   Increase activity slowly   Complete by:  As directed       Discharge Medications   Allergies as of 07/11/2018      Reactions   Shellfish Allergy Hives   " I BREAK OUT IN HIVES "      Medication List    STOP taking these medications   levofloxacin 500 MG tablet Commonly known as:  LEVAQUIN     TAKE these medications   acyclovir 400 MG tablet Commonly known as:  ZOVIRAX Take 1 tablet (400 mg total) by mouth 2 (two) times daily.   amoxicillin-clavulanate 875-125 MG tablet Commonly known as:  AUGMENTIN Take 1 tablet by mouth every 12 (twelve) hours.   benazepril 40 MG tablet Commonly known as:  LOTENSIN Take 40 mg by mouth daily.   chlorhexidine 0.12 % solution Commonly known as:  PERIDEX Use as directed 15 mLs in the mouth or throat 2 (two) times daily.   dorzolamide-timolol 22.3-6.8 MG/ML ophthalmic solution Commonly known as:  COSOPT Place 1 drop into both eyes 2 (two) times daily.   doxycycline 100 MG tablet Commonly known as:  VIBRA-TABS Take 1 tablet (100 mg total) by mouth every 12 (twelve) hours.   fluconazole 50 MG tablet Commonly known as:  DIFLUCAN Take 1 tablet (50 mg total) by mouth  daily.   gemfibrozil 600 MG tablet Commonly known as:  LOPID Take 600 mg by mouth 2 (two) times daily.   metoprolol tartrate 25 MG tablet Commonly known as:  LOPRESSOR Take 25 mg by mouth 2 (two) times daily.   montelukast 10 MG tablet Commonly known as:  SINGULAIR Take 10 mg by mouth daily.   pantoprazole 40 MG tablet Commonly known as:  PROTONIX Take 1 tablet (40 mg total) by  mouth 2 (two) times daily.   prochlorperazine 5 MG tablet Commonly known as:  COMPAZINE TAKE 1 TABLET(5 MG) BY MOUTH EVERY 6 HOURS AS NEEDED FOR NAUSEA OR VOMITING What changed:  See the new instructions.   sorbitol 70 % solution Take 15 mLs by mouth daily as needed. What changed:  reasons to take this   tetrahydrozoline-zinc 0.05-0.25 % ophthalmic solution Commonly known as:  VISINE-AC Place 2 drops into both eyes as needed (redness).   traMADol 50 MG tablet Commonly known as:  ULTRAM Take 1 tablet (50 mg total) by mouth every 6 (six) hours as needed.   TRAVATAN Z 0.004 % Soln ophthalmic solution Generic drug:  Travoprost (BAK Free) Place 1 drop into both eyes every evening.   triamterene-hydrochlorothiazide 37.5-25 MG tablet Commonly known as:  MAXZIDE-25 Take 1 tablet by mouth daily.   VENTOLIN HFA 108 (90 Base) MCG/ACT inhaler Generic drug:  albuterol Inhale 1-2 puffs into the lungs 4 (four) times daily as needed.       Follow-up Information    [name], MD. Schedule an appointment as soon as possible for a visit in 1 week(s).   Specialty:  Internal Medicine Contact information: [address] [place] [address] [phone]        Ladell Pier, MD. Schedule an appointment as soon as possible for a visit in 4 day(s).   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 - SEE COMMENT. PERIPHERAL BLOOD: - PANCYTOPENIA.    Ct Abdomen Pelvis W Contrast  Result Date: 07/07/2018 CLINICAL DATA:  Recent onset of weakness. Opened wound of the anus. History of COPD, diabetes, hypertension and myelodysplasia. EXAM: CT ABDOMEN AND PELVIS WITH CONTRAST TECHNIQUE: Multidetector CT imaging of the abdomen and pelvis was performed using the standard protocol following bolus administration of intravenous contrast. CONTRAST:  173m OMNIPAQUE IOHEXOL 300 MG/ML  SOLN COMPARISON:  Abdominopelvic CT 04/02/2018. FINDINGS: Lower chest: Images through the lung bases are degraded by breathing artifact. Subpleural right lower lobe nodule measuring 7 mm on image 20/4 similar to the previous study. There is a new 7 mm right lower lobe nodule on image 20/4 as well as a new 6 mm right lower lobe nodule on image 11/4. The heart is enlarged. No significant pleural or pericardial effusion. Hepatobiliary: The liver is normal in density without focal abnormality. No evidence of gallstones, gallbladder wall thickening or biliary dilatation. Pancreas: Unremarkable. No pancreatic ductal dilatation or surrounding inflammatory changes. Spleen: Normal in size without focal abnormality. Adrenals/Urinary Tract: Both adrenal glands appear normal. There is a stable tiny nonobstructing calculus in the lower pole of the left kidney, best seen on sagittal image 75/8. No evidence of ureteral calculus, hydronephrosis or perinephric soft tissue stranding. Probable tiny cyst anteriorly in the interpolar region of the left kidney. The bladder appears unremarkable. Stomach/Bowel: No evidence of bowel wall thickening, distention or surrounding inflammatory change. The appendix appears normal. There is moderate stool throughout the colon. There is a perianal air-fluid collection measuring  up to 3.4 x 1.9 cm on axial image 100/3, likely reflecting a fistula and abscess. This is best demonstrated on sagittal images  55 through 60/8 and demonstrates surrounding enhancement. No evidence of intrapelvic abscess. Vascular/Lymphatic: There are no enlarged abdominal or pelvic lymph nodes. Small inguinal lymph nodes are likely reactive. Moderate aortic and branch vessel atherosclerosis. Reproductive: Stable mild enlargement of the prostate gland. Other: Small umbilical hernia containing only fat. No ascites, free air or intra-abdominal abscess. Musculoskeletal: No acute or significant osseous findings. IMPRESSION: 1. New perianal air-fluid collection with surrounding enhancement consistent with fistula and perianal abscess. 2. No evidence of bowel obstruction or intra-abdominal abscess. 3. Increased nodularity at the right lung base since recent CT of 3 months ago, possibly inflammatory. Follow-up chest CT in 3-6 months recommended to exclude the possibility of metastatic disease. 4.  Aortic Atherosclerosis (ICD10-I70.0). Electronically Signed   By: Richardean Sale M.D.   On: 07/07/2018 19:31   Dg Chest Portable 1 View  Result Date: 07/07/2018 CLINICAL DATA:  Patient fell in a parking lot, possible syncopal episode. The patient is shivering and the skin is hot to the touch. History of COPD-asthma, former smoker, myelodysplastic syndrome. EXAM: PORTABLE CHEST 1 VIEW COMPARISON:  PA and lateral chest x-ray of May 24, 2018. FINDINGS: The lungs are adequately inflated. The interstitial markings are coarse and more conspicuous than on the previous study. The heart is top-normal in size. The pulmonary vascularity is not clearly engorged. There is calcification in the wall of the thoracic aorta. The right-sided PICC line tip terminates at the junction of the middle and distal thirds of the SVC. There is no pleural effusion. The observed bony thorax exhibits no acute abnormality. IMPRESSION: Mildly increased  lung markings bilaterally may reflect interstitial edema of cardiac or noncardiac cause. Interstitial pneumonia is felt less likely. There is underlying COPD. Thoracic aortic atherosclerosis. Electronically Signed   By: David  Martinique M.D.   On: 07/07/2018 15:15   Ct Bone Marrow Biopsy & Aspiration  Result Date: 07/09/2018 INDICATION: 67 year old male with pancytopenia EXAM: CT GUIDED BONE MARROW ASPIRATION AND CORE BIOPSY Interventional Radiologist:  Criselda Peaches, MD MEDICATIONS: None. ANESTHESIA/SEDATION: Moderate (conscious) sedation was employed during this procedure. A total of 1 milligrams versed and 50 micrograms fentanyl were administered intravenously. The patient's level of consciousness and vital signs were monitored continuously by radiology nursing throughout the procedure under my direct supervision. Total monitored sedation time: 14 minutes FLUOROSCOPY TIME:  None COMPLICATIONS: None immediate. Estimated blood loss: <25 mL PROCEDURE: Informed written consent was obtained from the patient after a thorough discussion of the procedural risks, benefits and alternatives. All questions were addressed. Maximal Sterile Barrier Technique was utilized including caps, mask, sterile gowns, sterile gloves, sterile drape, hand hygiene and skin antiseptic. A timeout was performed prior to the initiation of the procedure. The patient was positioned prone and non-contrast localization CT was performed of the pelvis to demonstrate the iliac marrow spaces. Maximal barrier sterile technique utilized including caps, mask, sterile gowns, sterile gloves, large sterile drape, hand hygiene, and betadine prep. Under sterile conditions and local anesthesia, an 11 gauge coaxial bone biopsy needle was advanced into the right iliac marrow space. Needle position was confirmed with CT imaging. Initially, bone marrow aspiration was performed. Next, the 11 gauge outer cannula was utilized to obtain a right iliac bone marrow  core biopsy. Needle was removed. Hemostasis was obtained with compression. The patient tolerated the procedure well. Samples were prepared with the cytotechnologist. IMPRESSION: Technically successful CT-guided bone marrow biopsy and aspiration. Electronically Signed   By: Jacqulynn Cadet M.D.   On: 07/09/2018 11:10  Micro Results     Recent Results (from the past 240 hour(s))  Blood Culture (routine x 2)     Status: None (Preliminary result)   Collection Time: 07/07/18  2:35 PM  Result Value Ref Range Status   Specimen Description BLOOD RIGHT ARM  Final   Special Requests   Final    BOTTLES DRAWN AEROBIC AND ANAEROBIC Blood Culture adequate volume   Culture   Final    NO GROWTH 4 DAYS Performed at [facility], [address], [place], [place] [address]    Report Status PENDING  Incomplete  Urine culture     Status: None   Collection Time: 07/07/18  3:42 PM  Result Value Ref Range Status   Specimen Description URINE, CLEAN CATCH  Final   Special Requests NONE  Final   Culture   Final    NO GROWTH Performed at [facility], [place] [address] [place], [place] [address]    Report Status 07/08/2018 FINAL  Final  Culture, blood (Routine X 2) w Reflex to ID Panel     Status: None (Preliminary result)   Collection Time: 07/08/18  9:35 AM  Result Value Ref Range Status   Specimen Description BLOOD LEFT ANTECUBITAL  Final   Special Requests   Final    BOTTLES DRAWN AEROBIC AND ANAEROBIC Blood Culture adequate volume   Culture   Final    NO GROWTH 3 DAYS Performed at [facility], [place] [address], [place], [place] [address]    Report Status PENDING  Incomplete  MRSA PCR Screening     Status: None   Collection Time: 07/09/18 12:47 PM  Result Value Ref Range Status   MRSA by PCR NEGATIVE NEGATIVE Final    Comment:        The GeneXpert MRSA Assay (FDA approved for NASAL specimens only), is one component of a comprehensive MRSA colonization surveillance program.  It is not intended to diagnose MRSA infection nor to guide or monitor treatment for MRSA infections. Performed at [facility], [place] [address], [place], [place] [address]     Today   Subjective    Andrew [name] today has no headache,no chest abdominal pain,no new weakness tingling or numbness, feels much better wants to go home today.    Objective   Blood pressure 118/65, pulse 82, temperature 99 F (37.2 C), temperature source Oral, resp. rate 18, height '5\' 7"'$  (1.702 m), weight 66.5 kg, SpO2 100 %.   Intake/Output Summary (Last 24 hours) at 07/11/2018 0844 Last data filed at 07/11/2018 0523 Gross per 24 hour  Intake 500 ml  Output 800 ml  Net -300 ml    Exam Awake Alert, Oriented x 3, No new F.N deficits, Normal affect [place].AT,PERRAL Supple Neck,No JVD, No cervical lymphadenopathy appriciated.  Symmetrical Chest wall movement, Good air movement bilaterally, CTAB RRR,No Gallops,Rubs or new Murmurs, No Parasternal Heave +ve B.Sounds, Abd Soft, Non tender, No organomegaly appriciated, No rebound -guarding or rigidity. No Cyanosis, Clubbing or edema, No new Rash or bruise   Data Review   CBC w Diff:  Lab Results  Component Value Date   WBC 0.3 (LL) 07/10/2018   HGB 7.1 (L) 07/10/2018   HGB 8.1 (L) 07/04/2018   HCT 22.2 (L) 07/10/2018   PLT 64 (L) 07/10/2018   PLT 5 (LL) 07/04/2018   LYMPHOPCT 70 07/09/2018   MONOPCT 10 07/09/2018   EOSPCT 7 07/09/2018   BASOPCT 3 07/09/2018  CMP:  Lab Results  Component Value Date   NA 134 (L) 07/10/2018   K 4.6 07/10/2018   CL 102 07/10/2018   CO2 24 07/10/2018   BUN 12 07/10/2018   CREATININE 0.76 07/10/2018   CREATININE 1.42 (H) 06/16/2018   PROT 6.8 07/10/2018   ALBUMIN 1.9 (L) 07/10/2018   BILITOT 0.9 07/10/2018   BILITOT 0.5 06/16/2018   ALKPHOS 192 (H) 07/10/2018   AST 20 07/10/2018   AST 16 06/16/2018   ALT 30 07/10/2018   ALT 22 06/16/2018  .   Total Time in preparing paper work, data evaluation  and todays exam - 55 minutes  Lala Lund M.D on 07/11/2018 at 8:44 AM  [facility]   Office  [phone]

## 2018-07-11 NOTE — Discharge Instructions (Signed)
Follow with Primary MD [name], MD in 3 days   Get CBC, CMP checked  by Primary MD  in 3 days    Activity: As tolerated with Full fall precautions use walker/cane & assistance as needed  Disposition Home   Diet: Heart Healthy     Special Instructions: If you have smoked or chewed Tobacco  in the last 2 yrs please stop smoking, stop any regular Alcohol  and or any Recreational drug use.  On your next visit with your primary care physician please Get Medicines reviewed and adjusted.  Please request your Prim.MD to go over all Hospital Tests and Procedure/Radiological results at the follow up, please get all Hospital records sent to your Prim MD by signing hospital release before you go home.  If you experience worsening of your admission symptoms, develop shortness of breath, life threatening emergency, suicidal or homicidal thoughts you must seek medical attention immediately by calling 911 or calling your MD immediately  if symptoms less severe.  You Must read complete instructions/literature along with all the possible adverse reactions/side effects for all the Medicines you take and that have been prescribed to you. Take any new Medicines after you have completely understood and accpet all the possible adverse reactions/side effects.

## 2018-07-11 NOTE — Plan of Care (Signed)
Patient is making progress, alert , oriented, wound treatment performed, no sign of infection noted. Respirations are even, regular and unlabored vital sign stable, No acute distress noted.

## 2018-07-11 NOTE — Progress Notes (Signed)
PT Cancellation Note  Patient Details Name: Andrew [name] MRN: [id number] DOB: 11-16-1950   Cancelled Treatment:    Reason Eval/Treat Not Completed: Other (comment).  Pt was not seen as he refused visit including just doing bed ex.  Pt will be seen again at another time to reattempt.   Ramond Dial 07/11/2018, 10:57 AM   Mee Hives, PT MS Acute Rehab Dept. Number: [place] [facility] [place]

## 2018-07-11 NOTE — Care Management Note (Addendum)
Case Management Note  Patient Details  Name: Andrew [name] MRN: [id number] Date of Birth: 1951/06/17  Subjective/Objective:   Admitted with perianal abscess. 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 From home with family.        Federico Flake (Daughter) Annabell Howells (Sister)    [phone]       Action/Plan: Transition to home with home health services to follow. Pt states has transportation to home.  Expected Discharge Date:  07/11/18               Expected Discharge Plan:  Home/Self Care  In-House Referral:     Discharge planning Services  CM Consult  Post Acute Care Choice:    Choice offered to:  Patient  DME Arranged:  N/A DME Agency:  NA  HH Arranged:  RN, PT, OT, Nurse's Aide [place] Agency:  [facility](declined home health services)  Status of Service:  Completed, signed off  If discussed at [place] of Stay Meetings, dates discussed:    Additional Comments:  Sharin Mons, RN 07/11/2018, 10:52 AM

## 2018-07-12 LAB — CULTURE, BLOOD (ROUTINE X 2)
CULTURE: NO GROWTH
Special Requests: ADEQUATE

## 2018-07-13 LAB — CULTURE, BLOOD (ROUTINE X 2)
Culture: NO GROWTH
Special Requests: ADEQUATE

## 2018-07-14 ENCOUNTER — Encounter: Payer: Self-pay | Admitting: Nurse Practitioner

## 2018-07-14 ENCOUNTER — Inpatient Hospital Stay: Payer: Medicare Other

## 2018-07-14 ENCOUNTER — Inpatient Hospital Stay (HOSPITAL_BASED_OUTPATIENT_CLINIC_OR_DEPARTMENT_OTHER): Payer: Medicare Other | Admitting: Nurse Practitioner

## 2018-07-14 ENCOUNTER — Telehealth: Payer: Self-pay | Admitting: Nurse Practitioner

## 2018-07-14 VITALS — BP 120/73 | HR 87 | Temp 98.3°F | Resp 18 | Ht 67.0 in | Wt 141.5 lb

## 2018-07-14 DIAGNOSIS — R634 Abnormal weight loss: Secondary | ICD-10-CM | POA: Diagnosis not present

## 2018-07-14 DIAGNOSIS — D701 Agranulocytosis secondary to cancer chemotherapy: Secondary | ICD-10-CM | POA: Diagnosis not present

## 2018-07-14 DIAGNOSIS — Z79899 Other long term (current) drug therapy: Secondary | ICD-10-CM

## 2018-07-14 DIAGNOSIS — D61818 Other pancytopenia: Secondary | ICD-10-CM

## 2018-07-14 DIAGNOSIS — K056 Periodontal disease, unspecified: Secondary | ICD-10-CM | POA: Diagnosis not present

## 2018-07-14 DIAGNOSIS — I959 Hypotension, unspecified: Secondary | ICD-10-CM | POA: Diagnosis not present

## 2018-07-14 DIAGNOSIS — K626 Ulcer of anus and rectum: Secondary | ICD-10-CM

## 2018-07-14 DIAGNOSIS — D469 Myelodysplastic syndrome, unspecified: Secondary | ICD-10-CM

## 2018-07-14 DIAGNOSIS — Z452 Encounter for adjustment and management of vascular access device: Secondary | ICD-10-CM

## 2018-07-14 LAB — CBC WITH DIFFERENTIAL (CANCER CENTER ONLY)
BAND NEUTROPHILS: 1 %
Basophils Absolute: 0 10*3/uL (ref 0.0–0.1)
Basophils Relative: 1 %
EOS PCT: 0 %
Eosinophils Absolute: 0 10*3/uL (ref 0.0–0.5)
HCT: 19.6 % — ABNORMAL LOW (ref 39.0–52.0)
HEMOGLOBIN: 6.6 g/dL — AB (ref 13.0–17.0)
Lymphocytes Relative: 80 %
Lymphs Abs: 0.4 10*3/uL — ABNORMAL LOW (ref 0.7–4.0)
MCH: 29.6 pg (ref 26.0–34.0)
MCHC: 33.7 g/dL (ref 30.0–36.0)
MCV: 87.9 fL (ref 80.0–100.0)
Metamyelocytes Relative: 1 %
Monocytes Absolute: 0 10*3/uL — ABNORMAL LOW (ref 0.1–1.0)
Monocytes Relative: 1 %
Neutro Abs: 0.1 10*3/uL — CL (ref 1.7–17.7)
Neutrophils Relative %: 16 %
Platelet Count: 10 10*3/uL — CL (ref 150–400)
RBC: 2.23 MIL/uL — AB (ref 4.22–5.81)
RDW: 12.3 % (ref 11.5–15.5)
WBC: 0.5 10*3/uL — AB (ref 4.0–10.5)
nRBC: 0 % (ref 0.0–0.2)

## 2018-07-14 LAB — CMP (CANCER CENTER ONLY)
ALK PHOS: 332 U/L — AB (ref 38–126)
ALT: 68 U/L — ABNORMAL HIGH (ref 0–44)
ANION GAP: 11 (ref 5–15)
AST: 43 U/L — ABNORMAL HIGH (ref 15–41)
Albumin: 2 g/dL — ABNORMAL LOW (ref 3.5–5.0)
BILIRUBIN TOTAL: 1.2 mg/dL (ref 0.3–1.2)
BUN: 22 mg/dL (ref 8–23)
CALCIUM: 9.6 mg/dL (ref 8.9–10.3)
CO2: 23 mmol/L (ref 22–32)
Chloride: 95 mmol/L — ABNORMAL LOW (ref 98–111)
Creatinine: 1.02 mg/dL (ref 0.61–1.24)
GFR, Est AFR Am: >60 mL/min (ref >60)
GFR, Estimated: >60 mL/min (ref >60)
Glucose, Bld: 145 mg/dL — ABNORMAL HIGH (ref 70–99)
Potassium: 4 mmol/L (ref 3.5–5.1)
SODIUM: 129 mmol/L — AB (ref 135–145)
Total Protein: 8.3 g/dL — ABNORMAL HIGH (ref 6.5–8.1)

## 2018-07-14 MED ORDER — TBO-FILGRASTIM 480 MCG/0.8ML ~~LOC~~ SOSY
PREFILLED_SYRINGE | SUBCUTANEOUS | Status: AC
  Filled 2018-07-14: qty 0.8

## 2018-07-14 MED ORDER — SODIUM CHLORIDE 0.9% FLUSH
10.0000 mL | INTRAVENOUS | Status: DC | PRN
  Administered 2018-07-14: 10 mL
  Filled 2018-07-14: qty 10

## 2018-07-14 MED ORDER — HEPARIN SOD (PORK) LOCK FLUSH 100 UNIT/ML IV SOLN
500.0000 [IU] | Freq: Once | INTRAVENOUS | Status: AC | PRN
  Administered 2018-07-14: 500 [IU]
  Filled 2018-07-14: qty 5

## 2018-07-14 MED ORDER — TBO-FILGRASTIM 480 MCG/0.8ML ~~LOC~~ SOSY
480.0000 ug | PREFILLED_SYRINGE | Freq: Every day | SUBCUTANEOUS | Status: DC
  Administered 2018-07-14: 480 ug via SUBCUTANEOUS

## 2018-07-14 NOTE — Progress Notes (Addendum)
[facility] OFFICE PROGRESS NOTE   Diagnosis: MDS  INTERVAL HISTORY:   Andrew [name] returns as scheduled.  He was hospitalized with fever and severe pancytopenia 07/07/2018.  He was discharged home 07/11/2018.  He is having intermittent fever 100.1 to 100.2 degrees.  Some chills.  Some sweats.  Left leg pain is better.  He has noted some improvement in appetite.  No nausea or vomiting.  No bleeding.  He is no longer having rectal pain.  He has not taken Augmentin since he was discharged from the hospital.  Objective:  Vital signs in last 24 hours:  Blood pressure 120/73, pulse (!) 118, temperature 98.3 F (36.8 C), temperature source Oral, resp. rate 18, height _0  (1.702 m), weight 141 lb 8 oz (64.2 kg), SpO2 100 %.    HEENT: No thrush or ulcers. Resp: Lungs clear bilaterally. Cardio: Regular rate and rhythm. GI: Abdomen soft and nontender.  No hepatospleno megaly. Vascular: No leg edema. Neuro: Alert and oriented. Skin: Ulcerations at the upper gluteal fold covered with stool. Right upper extremity PICC without erythema.   Lab Results:  Lab Results  Component Value Date   WBC PENDING 07/14/2018   HGB 6.6 (LL) 07/14/2018   HCT 19.6 (L) 07/14/2018   MCV 87.9 07/14/2018   PLT 10 (LL) 07/14/2018   NEUTROABS PENDING 07/14/2018    Imaging:  No results found.  Medications: I have reviewed the patient's current medications.  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  Cycle 1 5-azacytidine 05/06/2018  Cycle25-azacytidine 06/16/2018  Bone marrow biopsy 07/09/2018-markedly hypocellular with decreased myeloid elements.  No blast population.  Felt  to likely be related to a treatment effect. 2.Renal insufficiency 3.Diabetes 4.Hemoccult positive stool  Upper endoscopy 04/03/2018-chronic gastritis, duodenitis  Colonoscopy 04/04/2018-diverticulosis in the sigmoid colon 5.Right lower lobe 7 mm subpleural nodule on chest CT 04/02/2018 6.Admission with febrile neutropenia 05/20/2018- placed on cefepime, antibiotics changed to vancomycin/meropenem on 05/21/2018, anidulafungin added 9/1/2019antibiotics stopped 05/31/2018, discharge 06/03/2018 with prophylactic acyclovir, Bactrim, and Diflucan. Bactrim discontinued and Levaquin started 06/05/2018 7. 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  Admission with fever and severe neutropenia 07/07/2018  Disposition: Andrew [name] continues to have severe neutropenia.  The recent bone marrow biopsy was markedly hypocellular with decreased myeloid elements.  This is felt to likely be related to treatment.  Plan to continue supportive care with transfusion support as needed.  He will begin Granix 480 mcg daily.  We discussed the importance of adhering to the medication regimen.  He will resume Augmentin, acyclovir, Diflucan.  We will see him back on 07/16/2018.  He understands to contact the office in the interim with any problems.  Patient seen with Dr. [name].  25 minutes were spent face-to-face at today's visit with the majority of that time involved in counseling/coordination of care.    [name] ANP/GNP-BC   07/14/2018  12:12 PM  This was a shared visit with [name].  Andrew [name] was  interviewed and examined.  He has persistent severe pancytopenia after 2 cycles of 5 azacytidine.  I discussed the bone marrow biopsy findings with Andrew [name] and his daughter.  He remains at high risk for infection and bleeding.  He will continue antibiotic prophylaxis.  Hopefully the white count and platelets will improve over the next several weeks.  He agrees to a trial of G-CSF.  This will start today.  Andrew [name] will continue sitz baths.  He will return for an office visit 07/16/2018.  [name], MD

## 2018-07-14 NOTE — Telephone Encounter (Signed)
Scheduled appt per 10/21 los - unable to schedule blood for 10/23 due to cap - gave patient AVS and calender per los.

## 2018-07-15 ENCOUNTER — Inpatient Hospital Stay: Payer: Medicare Other

## 2018-07-15 ENCOUNTER — Other Ambulatory Visit: Payer: Self-pay | Admitting: Nurse Practitioner

## 2018-07-15 DIAGNOSIS — D469 Myelodysplastic syndrome, unspecified: Secondary | ICD-10-CM | POA: Diagnosis not present

## 2018-07-15 MED ORDER — TBO-FILGRASTIM 480 MCG/0.8ML ~~LOC~~ SOSY
480.0000 ug | PREFILLED_SYRINGE | Freq: Once | SUBCUTANEOUS | Status: AC
  Administered 2018-07-15: 480 ug via SUBCUTANEOUS

## 2018-07-15 MED ORDER — TBO-FILGRASTIM 480 MCG/0.8ML ~~LOC~~ SOSY
PREFILLED_SYRINGE | SUBCUTANEOUS | Status: AC
  Filled 2018-07-15: qty 0.8

## 2018-07-15 NOTE — Patient Instructions (Signed)
Tbo-Filgrastim injection What is this medicine? TBO-FILGRASTIM (T B O fil GRA stim) is a granulocyte colony-stimulating factor that stimulates the growth of neutrophils, a type of white blood cell important in the body's fight against infection. It is used to reduce the incidence of fever and infection in patients with certain types of cancer who are receiving chemotherapy that affects the bone marrow. This medicine may be used for other purposes; ask your health care provider or pharmacist if you have questions. COMMON BRAND NAME(S): Granix What should I tell my health care provider before I take this medicine? They need to know if you have any of these conditions: -bone scan or tests planned -kidney disease -sickle cell anemia -an unusual or allergic reaction to tbo-filgrastim, filgrastim, pegfilgrastim, other medicines, foods, dyes, or preservatives -pregnant or trying to get pregnant -breast-feeding How should I use this medicine? This medicine is for injection under the skin. If you get this medicine at home, you will be taught how to prepare and give this medicine. Refer to the Instructions for Use that come with your medication packaging. Use exactly as directed. Take your medicine at regular intervals. Do not take your medicine more often than directed. It is important that you put your used needles and syringes in a special sharps container. Do not put them in a trash can. If you do not have a sharps container, call your pharmacist or healthcare provider to get one. Talk to your pediatrician regarding the use of this medicine in children. Special care may be needed. Overdosage: If you think you have taken too much of this medicine contact a poison control center or emergency room at once. NOTE: This medicine is only for you. Do not share this medicine with others. What if I miss a dose? It is important not to miss your dose. Call your doctor or health care professional if you miss a  dose. What may interact with this medicine? This medicine may interact with the following medications: -medicines that may cause a release of neutrophils, such as lithium This list may not describe all possible interactions. Give your health care provider a list of all the medicines, herbs, non-prescription drugs, or dietary supplements you use. Also tell them if you smoke, drink alcohol, or use illegal drugs. Some items may interact with your medicine. What should I watch for while using this medicine? You may need blood work done while you are taking this medicine. What side effects may I notice from receiving this medicine? Side effects that you should report to your doctor or health care professional as soon as possible: -allergic reactions like skin rash, itching or hives, swelling of the face, lips, or tongue -blood in the urine -dark urine -dizziness -fast heartbeat -feeling faint -shortness of breath or breathing problems -signs and symptoms of infection like fever or chills; cough; or sore throat -signs and symptoms of kidney injury like trouble passing urine or change in the amount of urine -stomach or side pain, or pain at the shoulder -sweating -swelling of the legs, ankles, or abdomen -tiredness Side effects that usually do not require medical attention (report to your doctor or health care professional if they continue or are bothersome): -bone pain -headache -muscle pain -vomiting This list may not describe all possible side effects. Call your doctor for medical advice about side effects. You may report side effects to FDA at 1-800-FDA-1088. Where should I keep my medicine? Keep out of the reach of children. Store in a refrigerator between   2 and 8 degrees C (36 and 46 degrees F). Keep in carton to protect from light. Throw away this medicine if it is left out of the refrigerator for more than 5 consecutive days. Throw away any unused medicine after the expiration  date. NOTE: This sheet is a summary. It may not cover all possible information. If you have questions about this medicine, talk to your doctor, pharmacist, or health care provider.  2018 Elsevier/Gold Standard (2015-10-31 19:07:04)  

## 2018-07-16 ENCOUNTER — Inpatient Hospital Stay: Payer: Medicare Other

## 2018-07-16 ENCOUNTER — Encounter: Payer: Self-pay | Admitting: Nurse Practitioner

## 2018-07-16 ENCOUNTER — Inpatient Hospital Stay (HOSPITAL_BASED_OUTPATIENT_CLINIC_OR_DEPARTMENT_OTHER): Payer: Medicare Other | Admitting: Nurse Practitioner

## 2018-07-16 VITALS — BP 123/61 | HR 100 | Temp 98.4°F | Resp 18 | Ht 67.0 in | Wt 142.4 lb

## 2018-07-16 DIAGNOSIS — Z452 Encounter for adjustment and management of vascular access device: Secondary | ICD-10-CM

## 2018-07-16 DIAGNOSIS — D469 Myelodysplastic syndrome, unspecified: Secondary | ICD-10-CM

## 2018-07-16 DIAGNOSIS — Z79899 Other long term (current) drug therapy: Secondary | ICD-10-CM | POA: Diagnosis not present

## 2018-07-16 DIAGNOSIS — D61818 Other pancytopenia: Secondary | ICD-10-CM

## 2018-07-16 DIAGNOSIS — D701 Agranulocytosis secondary to cancer chemotherapy: Secondary | ICD-10-CM | POA: Diagnosis not present

## 2018-07-16 LAB — CMP (CANCER CENTER ONLY)
ALK PHOS: 310 U/L — AB (ref 38–126)
ALT: 50 U/L — ABNORMAL HIGH (ref 0–44)
ANION GAP: 11 (ref 5–15)
AST: 18 U/L (ref 15–41)
Albumin: 1.9 g/dL — ABNORMAL LOW (ref 3.5–5.0)
BUN: 18 mg/dL (ref 8–23)
CO2: 24 mmol/L (ref 22–32)
Calcium: 9.4 mg/dL (ref 8.9–10.3)
Chloride: 97 mmol/L — ABNORMAL LOW (ref 98–111)
Creatinine: 0.74 mg/dL (ref 0.61–1.24)
GFR, Estimated: >60 mL/min (ref >60)
Glucose, Bld: 157 mg/dL — ABNORMAL HIGH (ref 70–99)
POTASSIUM: 4 mmol/L (ref 3.5–5.1)
SODIUM: 132 mmol/L — AB (ref 135–145)
TOTAL PROTEIN: 8.2 g/dL — AB (ref 6.5–8.1)
Total Bilirubin: 0.8 mg/dL (ref 0.3–1.2)

## 2018-07-16 LAB — CBC WITH DIFFERENTIAL (CANCER CENTER ONLY)
Abs Immature Granulocytes: 0 10*3/uL (ref 0.00–0.07)
BASOS ABS: 0 10*3/uL (ref 0.0–0.1)
BASOS PCT: 0 %
EOS ABS: 0 10*3/uL (ref 0.0–0.5)
Eosinophils Relative: 5 %
HCT: 17.8 % — ABNORMAL LOW (ref 39.0–52.0)
HEMOGLOBIN: 6 g/dL — AB (ref 13.0–17.0)
Immature Granulocytes: 0 %
Lymphocytes Relative: 57 %
Lymphs Abs: 0.2 10*3/uL — ABNORMAL LOW (ref 0.7–4.0)
MCH: 29.4 pg (ref 26.0–34.0)
MCHC: 33.7 g/dL (ref 30.0–36.0)
MCV: 87.3 fL (ref 80.0–100.0)
Monocytes Absolute: 0.1 10*3/uL (ref 0.1–1.0)
Monocytes Relative: 15 %
NEUTROS ABS: 0.1 10*3/uL — AB (ref 1.7–7.7)
NEUTROS PCT: 23 %
NRBC: 0 % (ref 0.0–0.2)
PLATELETS: 9 10*3/uL — AB (ref 150–400)
Platelet Morphology: NORMAL
RBC: 2.04 MIL/uL — AB (ref 4.22–5.81)
RDW: 12.1 % (ref 11.5–15.5)
WBC: 0.5 10*3/uL — AB (ref 4.0–10.5)

## 2018-07-16 LAB — SAMPLE TO BLOOD BANK

## 2018-07-16 LAB — PREPARE RBC (CROSSMATCH)

## 2018-07-16 MED ORDER — TBO-FILGRASTIM 480 MCG/0.8ML ~~LOC~~ SOSY
PREFILLED_SYRINGE | SUBCUTANEOUS | Status: AC
  Filled 2018-07-16: qty 0.8

## 2018-07-16 MED ORDER — HEPARIN SOD (PORK) LOCK FLUSH 100 UNIT/ML IV SOLN
500.0000 [IU] | Freq: Once | INTRAVENOUS | Status: AC | PRN
  Administered 2018-07-16: 250 [IU]
  Filled 2018-07-16: qty 5

## 2018-07-16 MED ORDER — TBO-FILGRASTIM 480 MCG/0.8ML ~~LOC~~ SOSY
480.0000 ug | PREFILLED_SYRINGE | Freq: Once | SUBCUTANEOUS | Status: AC
  Administered 2018-07-16: 480 ug via SUBCUTANEOUS

## 2018-07-16 MED ORDER — SODIUM CHLORIDE 0.9% FLUSH
10.0000 mL | INTRAVENOUS | Status: DC | PRN
  Administered 2018-07-16: 10 mL
  Filled 2018-07-16: qty 10

## 2018-07-16 NOTE — Progress Notes (Addendum)
  [facility] OFFICE PROGRESS NOTE   Diagnosis: MDS  INTERVAL HISTORY:   Andrew [name] returns as scheduled.  He denies bleeding.  No significant weakness.  No shortness of breath.  He continues to have intermittent low-grade fever.  None greater than 101.  Some chills.  He denies pain at the rectum or sacrum.  No diarrhea.  Objective:  Vital signs in last 24 hours:  Blood pressure 123/61, pulse (!) 124, temperature 98.4 F (36.9 C), temperature source Oral, resp. rate 18, height 5' 7" (1.702 m), weight 142 lb 6.4 oz (64.6 kg), SpO2 100 %.    HEENT: No thrush or ulcers. Resp: Lungs clear bilaterally. Cardio: Regular rate and rhythm. GI: Abdomen soft and nontender.  No hepatomegaly.  No splenomegaly. Vascular: No leg edema. Neuro: Alert and oriented. Right upper extremity PICC without erythema.  Lab Results:  Lab Results  Component Value Date   WBC 0.5 (LL) 07/16/2018   HGB 6.0 (LL) 07/16/2018   HCT 17.8 (L) 07/16/2018   MCV 87.3 07/16/2018   PLT 9 (LL) 07/16/2018   NEUTROABS 0.1 (LL) 07/16/2018    Imaging:  No results found.  Medications: I have reviewed the patient's current medications.  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  Cycle 1 5-azacytidine 05/06/2018  Cycle25-azacytidine 06/16/2018  Bone marrow biopsy 07/09/2018-markedly hypocellular with decreased myeloid elements.  No blast population.  Felt to likely be related to a treatment effect. 2.Renal insufficiency 3.Diabetes 4.Hemoccult positive stool  Upper endoscopy 04/03/2018-chronic gastritis, duodenitis  Colonoscopy 04/04/2018-diverticulosis in the sigmoid  colon 5.Right lower lobe 7 mm subpleural nodule on chest CT 04/02/2018 6.Admission with febrile neutropenia 05/20/2018- placed on cefepime, antibiotics changed to vancomycin/meropenem on 05/21/2018, anidulafungin added 9/1/2019antibiotics stopped 05/31/2018, discharge 06/03/2018 with prophylactic acyclovir, Bactrim, and Diflucan. Bactrim discontinued and Levaquin started 06/05/2018 7. 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 Admission with fever and severe neutropenia 07/07/2018  Disposition: Andrew [name] appears unchanged.  He continues to have severe pancytopenia.  We are arranging for a blood transfusion today.  Plan to continue Granix 480 mcg daily through 07/19/2018.  He continues Augmentin, acyclovir and Diflucan.  He will return for a repeat CBC on 07/18/2018.  We will see him in follow-up on 07/21/2018.  He will contact the office in the interim with any problems.   Patient seen with Dr. [name].    [name] ANP/GNP-BC   07/16/2018  12:08 PM This was a shared visit with [name].  Andrew [name] will continue G-CSF.  He will receive a red cell transfusion tomorrow.  He continues antibiotic prophylaxis.  [name], MD

## 2018-07-17 ENCOUNTER — Other Ambulatory Visit: Payer: Self-pay | Admitting: *Deleted

## 2018-07-17 ENCOUNTER — Inpatient Hospital Stay: Payer: Medicare Other

## 2018-07-17 ENCOUNTER — Other Ambulatory Visit: Payer: Self-pay | Admitting: Nurse Practitioner

## 2018-07-17 ENCOUNTER — Telehealth: Payer: Self-pay | Admitting: Nurse Practitioner

## 2018-07-17 VITALS — BP 102/49 | HR 78 | Temp 98.4°F | Resp 16

## 2018-07-17 DIAGNOSIS — D469 Myelodysplastic syndrome, unspecified: Secondary | ICD-10-CM | POA: Diagnosis not present

## 2018-07-17 DIAGNOSIS — D61818 Other pancytopenia: Secondary | ICD-10-CM

## 2018-07-17 DIAGNOSIS — Z452 Encounter for adjustment and management of vascular access device: Secondary | ICD-10-CM

## 2018-07-17 MED ORDER — ACETAMINOPHEN 325 MG PO TABS
ORAL_TABLET | ORAL | Status: AC
  Filled 2018-07-17: qty 2

## 2018-07-17 MED ORDER — DIPHENHYDRAMINE HCL 25 MG PO CAPS
25.0000 mg | ORAL_CAPSULE | Freq: Once | ORAL | Status: AC
  Administered 2018-07-17: 25 mg via ORAL

## 2018-07-17 MED ORDER — TBO-FILGRASTIM 480 MCG/0.8ML ~~LOC~~ SOSY
480.0000 ug | PREFILLED_SYRINGE | Freq: Once | SUBCUTANEOUS | Status: AC
  Administered 2018-07-17: 480 ug via SUBCUTANEOUS

## 2018-07-17 MED ORDER — DIPHENHYDRAMINE HCL 25 MG PO CAPS
ORAL_CAPSULE | ORAL | Status: AC
  Filled 2018-07-17: qty 1

## 2018-07-17 MED ORDER — SODIUM CHLORIDE 0.9% FLUSH
10.0000 mL | INTRAVENOUS | Status: AC | PRN
  Administered 2018-07-17: 10 mL
  Filled 2018-07-17: qty 10

## 2018-07-17 MED ORDER — ACETAMINOPHEN 325 MG PO TABS
650.0000 mg | ORAL_TABLET | Freq: Once | ORAL | Status: AC
  Administered 2018-07-17: 650 mg via ORAL

## 2018-07-17 MED ORDER — TBO-FILGRASTIM 480 MCG/0.8ML ~~LOC~~ SOSY
PREFILLED_SYRINGE | SUBCUTANEOUS | Status: AC
  Filled 2018-07-17: qty 0.8

## 2018-07-17 MED ORDER — HEPARIN SOD (PORK) LOCK FLUSH 100 UNIT/ML IV SOLN
250.0000 [IU] | INTRAVENOUS | Status: AC | PRN
  Administered 2018-07-17: 250 [IU]
  Filled 2018-07-17: qty 5

## 2018-07-17 MED ORDER — SODIUM CHLORIDE 0.9% IV SOLUTION
250.0000 mL | Freq: Once | INTRAVENOUS | Status: AC
  Administered 2018-07-17: 250 mL via INTRAVENOUS
  Filled 2018-07-17: qty 250

## 2018-07-17 NOTE — Patient Instructions (Signed)

## 2018-07-17 NOTE — Telephone Encounter (Signed)
Scheduled appt per 10/23 los - gave patient AVS and calender per los. In treatment area.

## 2018-07-18 ENCOUNTER — Inpatient Hospital Stay: Payer: Medicare Other

## 2018-07-18 VITALS — BP 105/53 | HR 99 | Temp 99.1°F | Resp 18

## 2018-07-18 DIAGNOSIS — Z452 Encounter for adjustment and management of vascular access device: Secondary | ICD-10-CM

## 2018-07-18 DIAGNOSIS — D61818 Other pancytopenia: Secondary | ICD-10-CM

## 2018-07-18 DIAGNOSIS — D469 Myelodysplastic syndrome, unspecified: Secondary | ICD-10-CM | POA: Diagnosis not present

## 2018-07-18 LAB — TYPE AND SCREEN
ABO/RH(D): O POS
Antibody Screen: NEGATIVE
UNIT DIVISION: 0
Unit division: 0

## 2018-07-18 LAB — BPAM RBC
Blood Product Expiration Date: 201911202359
Blood Product Expiration Date: 201911202359
ISSUE DATE / TIME: 201910240858
ISSUE DATE / TIME: 201910240858
UNIT TYPE AND RH: 5100
Unit Type and Rh: 5100

## 2018-07-18 LAB — CBC WITH DIFFERENTIAL (CANCER CENTER ONLY)
Abs Immature Granulocytes: 0 10*3/uL (ref 0.00–0.07)
BASOS ABS: 0 10*3/uL (ref 0.0–0.1)
BASOS PCT: 1 %
Eosinophils Absolute: 0 10*3/uL (ref 0.0–0.5)
Eosinophils Relative: 1 %
HCT: 22.3 % — ABNORMAL LOW (ref 39.0–52.0)
Hemoglobin: 7.5 g/dL — ABNORMAL LOW (ref 13.0–17.0)
IMMATURE GRANULOCYTES: 0 %
Lymphocytes Relative: 65 %
Lymphs Abs: 0.6 10*3/uL — ABNORMAL LOW (ref 0.7–4.0)
MCH: 29.5 pg (ref 26.0–34.0)
MCHC: 33.6 g/dL (ref 30.0–36.0)
MCV: 87.8 fL (ref 80.0–100.0)
MONOS PCT: 9 %
Monocytes Absolute: 0.1 10*3/uL (ref 0.1–1.0)
NEUTROS ABS: 0.2 10*3/uL — AB (ref 1.7–7.7)
NEUTROS PCT: 24 %
NRBC: 0 % (ref 0.0–0.2)
PLATELETS: 9 10*3/uL — AB (ref 150–400)
RBC: 2.54 MIL/uL — AB (ref 4.22–5.81)
RDW: 13.3 % (ref 11.5–15.5)
WBC: 0.9 10*3/uL — AB (ref 4.0–10.5)

## 2018-07-18 MED ORDER — HEPARIN SOD (PORK) LOCK FLUSH 100 UNIT/ML IV SOLN
500.0000 [IU] | Freq: Once | INTRAVENOUS | Status: AC | PRN
  Administered 2018-07-18: 500 [IU]
  Filled 2018-07-18: qty 5

## 2018-07-18 MED ORDER — TBO-FILGRASTIM 480 MCG/0.8ML ~~LOC~~ SOSY
PREFILLED_SYRINGE | SUBCUTANEOUS | Status: AC
  Filled 2018-07-18: qty 0.8

## 2018-07-18 MED ORDER — SODIUM CHLORIDE 0.9% FLUSH
10.0000 mL | INTRAVENOUS | Status: DC | PRN
  Administered 2018-07-18: 10 mL
  Filled 2018-07-18: qty 10

## 2018-07-18 MED ORDER — TBO-FILGRASTIM 480 MCG/0.8ML ~~LOC~~ SOSY
480.0000 ug | PREFILLED_SYRINGE | Freq: Once | SUBCUTANEOUS | Status: AC
  Administered 2018-07-18: 480 ug via SUBCUTANEOUS

## 2018-07-18 NOTE — Patient Instructions (Signed)
Tbo-Filgrastim injection What is this medicine? TBO-FILGRASTIM (T B O fil GRA stim) is a granulocyte colony-stimulating factor that stimulates the growth of neutrophils, a type of white blood cell important in the body's fight against infection. It is used to reduce the incidence of fever and infection in patients with certain types of cancer who are receiving chemotherapy that affects the bone marrow. This medicine may be used for other purposes; ask your health care provider or pharmacist if you have questions. COMMON BRAND NAME(S): Granix What should I tell my health care provider before I take this medicine? They need to know if you have any of these conditions: -bone scan or tests planned -kidney disease -sickle cell anemia -an unusual or allergic reaction to tbo-filgrastim, filgrastim, pegfilgrastim, other medicines, foods, dyes, or preservatives -pregnant or trying to get pregnant -breast-feeding How should I use this medicine? This medicine is for injection under the skin. If you get this medicine at home, you will be taught how to prepare and give this medicine. Refer to the Instructions for Use that come with your medication packaging. Use exactly as directed. Take your medicine at regular intervals. Do not take your medicine more often than directed. It is important that you put your used needles and syringes in a special sharps container. Do not put them in a trash can. If you do not have a sharps container, call your pharmacist or healthcare provider to get one. Talk to your pediatrician regarding the use of this medicine in children. Special care may be needed. Overdosage: If you think you have taken too much of this medicine contact a poison control center or emergency room at once. NOTE: This medicine is only for you. Do not share this medicine with others. What if I miss a dose? It is important not to miss your dose. Call your doctor or health care professional if you miss a  dose. What may interact with this medicine? This medicine may interact with the following medications: -medicines that may cause a release of neutrophils, such as lithium This list may not describe all possible interactions. Give your health care provider a list of all the medicines, herbs, non-prescription drugs, or dietary supplements you use. Also tell them if you smoke, drink alcohol, or use illegal drugs. Some items may interact with your medicine. What should I watch for while using this medicine? You may need blood work done while you are taking this medicine. What side effects may I notice from receiving this medicine? Side effects that you should report to your doctor or health care professional as soon as possible: -allergic reactions like skin rash, itching or hives, swelling of the face, lips, or tongue -blood in the urine -dark urine -dizziness -fast heartbeat -feeling faint -shortness of breath or breathing problems -signs and symptoms of infection like fever or chills; cough; or sore throat -signs and symptoms of kidney injury like trouble passing urine or change in the amount of urine -stomach or side pain, or pain at the shoulder -sweating -swelling of the legs, ankles, or abdomen -tiredness Side effects that usually do not require medical attention (report to your doctor or health care professional if they continue or are bothersome): -bone pain -headache -muscle pain -vomiting This list may not describe all possible side effects. Call your doctor for medical advice about side effects. You may report side effects to FDA at 1-800-FDA-1088. Where should I keep my medicine? Keep out of the reach of children. Store in a refrigerator between   2 and 8 degrees C (36 and 46 degrees F). Keep in carton to protect from light. Throw away this medicine if it is left out of the refrigerator for more than 5 consecutive days. Throw away any unused medicine after the expiration  date. NOTE: This sheet is a summary. It may not cover all possible information. If you have questions about this medicine, talk to your doctor, pharmacist, or health care provider.  2018 Elsevier/Gold Standard (2015-10-31 19:07:04)  

## 2018-07-18 NOTE — Progress Notes (Signed)
Completed on 07/18/2018

## 2018-07-19 ENCOUNTER — Inpatient Hospital Stay: Payer: Medicare Other

## 2018-07-19 VITALS — BP 113/67 | HR 106 | Temp 97.0°F | Resp 18

## 2018-07-19 DIAGNOSIS — D469 Myelodysplastic syndrome, unspecified: Secondary | ICD-10-CM

## 2018-07-19 DIAGNOSIS — Z452 Encounter for adjustment and management of vascular access device: Secondary | ICD-10-CM

## 2018-07-19 MED ORDER — TBO-FILGRASTIM 480 MCG/0.8ML ~~LOC~~ SOSY
480.0000 ug | PREFILLED_SYRINGE | Freq: Once | SUBCUTANEOUS | Status: AC
  Administered 2018-07-19: 480 ug via SUBCUTANEOUS

## 2018-07-19 MED ORDER — TBO-FILGRASTIM 480 MCG/0.8ML ~~LOC~~ SOSY
PREFILLED_SYRINGE | SUBCUTANEOUS | Status: AC
  Filled 2018-07-19: qty 0.8

## 2018-07-19 NOTE — Patient Instructions (Signed)
Tbo-Filgrastim injection What is this medicine? TBO-FILGRASTIM (T B O fil GRA stim) is a granulocyte colony-stimulating factor that stimulates the growth of neutrophils, a type of white blood cell important in the body's fight against infection. It is used to reduce the incidence of fever and infection in patients with certain types of cancer who are receiving chemotherapy that affects the bone marrow. This medicine may be used for other purposes; ask your health care provider or pharmacist if you have questions. COMMON BRAND NAME(S): Granix What should I tell my health care provider before I take this medicine? They need to know if you have any of these conditions: -bone scan or tests planned -kidney disease -sickle cell anemia -an unusual or allergic reaction to tbo-filgrastim, filgrastim, pegfilgrastim, other medicines, foods, dyes, or preservatives -pregnant or trying to get pregnant -breast-feeding How should I use this medicine? This medicine is for injection under the skin. If you get this medicine at home, you will be taught how to prepare and give this medicine. Refer to the Instructions for Use that come with your medication packaging. Use exactly as directed. Take your medicine at regular intervals. Do not take your medicine more often than directed. It is important that you put your used needles and syringes in a special sharps container. Do not put them in a trash can. If you do not have a sharps container, call your pharmacist or healthcare provider to get one. Talk to your pediatrician regarding the use of this medicine in children. Special care may be needed. Overdosage: If you think you have taken too much of this medicine contact a poison control center or emergency room at once. NOTE: This medicine is only for you. Do not share this medicine with others. What if I miss a dose? It is important not to miss your dose. Call your doctor or health care professional if you miss a  dose. What may interact with this medicine? This medicine may interact with the following medications: -medicines that may cause a release of neutrophils, such as lithium This list may not describe all possible interactions. Give your health care provider a list of all the medicines, herbs, non-prescription drugs, or dietary supplements you use. Also tell them if you smoke, drink alcohol, or use illegal drugs. Some items may interact with your medicine. What should I watch for while using this medicine? You may need blood work done while you are taking this medicine. What side effects may I notice from receiving this medicine? Side effects that you should report to your doctor or health care professional as soon as possible: -allergic reactions like skin rash, itching or hives, swelling of the face, lips, or tongue -blood in the urine -dark urine -dizziness -fast heartbeat -feeling faint -shortness of breath or breathing problems -signs and symptoms of infection like fever or chills; cough; or sore throat -signs and symptoms of kidney injury like trouble passing urine or change in the amount of urine -stomach or side pain, or pain at the shoulder -sweating -swelling of the legs, ankles, or abdomen -tiredness Side effects that usually do not require medical attention (report to your doctor or health care professional if they continue or are bothersome): -bone pain -headache -muscle pain -vomiting This list may not describe all possible side effects. Call your doctor for medical advice about side effects. You may report side effects to FDA at 1-800-FDA-1088. Where should I keep my medicine? Keep out of the reach of children. Store in a refrigerator between   2 and 8 degrees C (36 and 46 degrees F). Keep in carton to protect from light. Throw away this medicine if it is left out of the refrigerator for more than 5 consecutive days. Throw away any unused medicine after the expiration  date. NOTE: This sheet is a summary. It may not cover all possible information. If you have questions about this medicine, talk to your doctor, pharmacist, or health care provider.  2018 Elsevier/Gold Standard (2015-10-31 19:07:04)  

## 2018-07-21 ENCOUNTER — Inpatient Hospital Stay: Payer: Medicare Other

## 2018-07-21 ENCOUNTER — Inpatient Hospital Stay (HOSPITAL_BASED_OUTPATIENT_CLINIC_OR_DEPARTMENT_OTHER): Payer: Medicare Other | Admitting: Oncology

## 2018-07-21 ENCOUNTER — Inpatient Hospital Stay (HOSPITAL_BASED_OUTPATIENT_CLINIC_OR_DEPARTMENT_OTHER): Payer: Medicare Other | Admitting: *Deleted

## 2018-07-21 ENCOUNTER — Telehealth: Payer: Self-pay | Admitting: Oncology

## 2018-07-21 VITALS — BP 127/74 | HR 117 | Temp 98.7°F | Resp 18 | Ht 67.0 in | Wt 141.4 lb

## 2018-07-21 DIAGNOSIS — D709 Neutropenia, unspecified: Secondary | ICD-10-CM | POA: Diagnosis not present

## 2018-07-21 DIAGNOSIS — D61818 Other pancytopenia: Secondary | ICD-10-CM | POA: Diagnosis not present

## 2018-07-21 DIAGNOSIS — D469 Myelodysplastic syndrome, unspecified: Secondary | ICD-10-CM

## 2018-07-21 DIAGNOSIS — Z452 Encounter for adjustment and management of vascular access device: Secondary | ICD-10-CM

## 2018-07-21 LAB — CBC WITH DIFFERENTIAL (CANCER CENTER ONLY)
Basophils Absolute: 0 10*3/uL (ref 0.0–0.1)
Basophils Relative: 0 %
EOS ABS: 0 10*3/uL (ref 0.0–0.5)
EOS PCT: 0 %
HCT: 21.1 % — ABNORMAL LOW (ref 39.0–52.0)
Hemoglobin: 6.7 g/dL — CL (ref 13.0–17.0)
LYMPHS ABS: 0.6 10*3/uL — AB (ref 0.7–4.0)
LYMPHS PCT: 80 %
MCH: 28.8 pg (ref 26.0–34.0)
MCHC: 31.8 g/dL (ref 30.0–36.0)
MCV: 90.6 fL (ref 80.0–100.0)
MONO ABS: 0 10*3/uL — AB (ref 0.1–1.0)
Monocytes Relative: 0 %
Neutro Abs: 0.2 10*3/uL — CL (ref 1.7–17.7)
Neutrophils Relative %: 20 %
PLATELETS: 9 10*3/uL — AB (ref 150–400)
RBC: 2.33 MIL/uL — AB (ref 4.22–5.81)
RDW: 12.2 % (ref 11.5–15.5)
WBC: 0.8 10*3/uL — AB (ref 4.0–10.5)
nRBC: 0 % (ref 0.0–0.2)

## 2018-07-21 LAB — SAMPLE TO BLOOD BANK

## 2018-07-21 MED ORDER — TBO-FILGRASTIM 480 MCG/0.8ML ~~LOC~~ SOSY
480.0000 ug | PREFILLED_SYRINGE | Freq: Once | SUBCUTANEOUS | Status: AC
  Administered 2018-07-21: 480 ug via SUBCUTANEOUS

## 2018-07-21 MED ORDER — SODIUM CHLORIDE 0.9% FLUSH
10.0000 mL | INTRAVENOUS | Status: DC | PRN
  Administered 2018-07-21: 10 mL
  Filled 2018-07-21: qty 10

## 2018-07-21 MED ORDER — TBO-FILGRASTIM 480 MCG/0.8ML ~~LOC~~ SOSY
PREFILLED_SYRINGE | SUBCUTANEOUS | Status: AC
  Filled 2018-07-21: qty 0.8

## 2018-07-21 MED ORDER — HEPARIN SOD (PORK) LOCK FLUSH 100 UNIT/ML IV SOLN
500.0000 [IU] | Freq: Once | INTRAVENOUS | Status: AC | PRN
  Administered 2018-07-21: 250 [IU]
  Filled 2018-07-21: qty 5

## 2018-07-21 NOTE — Addendum Note (Signed)
Addended by: Betsy Coder B on: 07/21/2018 01:49 PM   Modules accepted: Orders

## 2018-07-21 NOTE — Telephone Encounter (Signed)
Scheduled appt per 10/28 los - gave patient AVS and calender per los.    

## 2018-07-21 NOTE — Progress Notes (Signed)
[facility] OFFICE PROGRESS NOTE   Diagnosis: Myelodysplasia  INTERVAL HISTORY:   Andrew [name] returns as scheduled.  He feels well.  He and his daughter have noted improvement in his appetite.  No fever or bleeding.  He continue sitz baths.  He was transfused packed red blood cells on 07/17/2018.  Objective:  Vital signs in last 24 hours:  Blood pressure 127/74, pulse (!) 117, temperature 98.7 F (37.1 C), resp. rate 18, height _0  (1.702 m), weight 141 lb 6.4 oz (64.1 kg), SpO2 100 %.    HEENT: Mild white coat of the tongue, no buccal thrush.  No bleeding. Resp: Lungs clear bilaterally Cardio: Regular rate and rhythm GI: Nontender, no hepatosplenomegaly Vascular: No leg edema  Skin: Ulcerations at the perineum with less stool, no bleeding, no surrounding tenderness  Portacath/PICC-without erythema  Lab Results:  Lab Results  Component Value Date   WBC 0.8 (LL) 07/21/2018   HGB 6.7 (LL) 07/21/2018   HCT 21.1 (L) 07/21/2018   MCV 90.6 07/21/2018   PLT 9 (LL) 07/21/2018   NEUTROABS 0.2 (LL) 07/21/2018    CMP  Lab Results  Component Value Date   NA 132 (L) 07/16/2018   K 4.0 07/16/2018   CL 97 (L) 07/16/2018   CO2 24 07/16/2018   GLUCOSE 157 (H) 07/16/2018   BUN 18 07/16/2018   CREATININE 0.74 07/16/2018   CALCIUM 9.4 07/16/2018   PROT 8.2 (H) 07/16/2018   ALBUMIN 1.9 (L) 07/16/2018   AST 18 07/16/2018   ALT 50 (H) 07/16/2018   ALKPHOS 310 (H) 07/16/2018   BILITOT 0.8 07/16/2018   GFRNONAA >60 07/16/2018   GFRAA >60 07/16/2018     Medications: I have reviewed the patient's current medications.   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  Cycle 1 5-azacytidine 05/06/2018  Cycle25-azacytidine 06/16/2018  Bone marrow biopsy 07/09/2018-markedly hypocellular with decreased myeloid elements. No blast population. Felt to likely be related to a treatment effect.  Trial of G-CSF 07/14/2018 2.Renal insufficiency 3.Diabetes 4.Hemoccult positive stool  Upper endoscopy 04/03/2018-chronic gastritis, duodenitis  Colonoscopy 04/04/2018-diverticulosis in the sigmoid colon 5.Right lower lobe 7 mm subpleural nodule on chest CT 04/02/2018 6.Admission with febrile neutropenia 05/20/2018- placed on cefepime, antibiotics changed to vancomycin/meropenem on 05/21/2018, anidulafungin added 9/1/2019antibiotics stopped 05/31/2018, discharge 06/03/2018 with prophylactic acyclovir, Bactrim, and Diflucan. Bactrim discontinued and Levaquin started 06/05/2018 7. 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 Admission with fever and severe neutropenia 07/07/2018    Disposition: Andrew [name] appears stable.  He has persistent severe pancytopenia.  There is been slight improvement in the white count over the past week and the platelet count is stable.  He does not appear to have significant symptoms from anemia today.  The plan is to continue G-CSF and close clinical follow-up.  He continues antibiotic prophylaxis.  Andrew [name] will return for an office visit next week. We will schedule a red cell transfusion if the hemoglobin is lower later this week.  I reviewed and adjusted the medication list with Andrew [name] and his daughter.  25 minutes  were spent with the patient today.  The majority of the time was used for counseling and coordination of care.  Betsy Coder, MD  07/21/2018  1:35 PM

## 2018-07-22 ENCOUNTER — Inpatient Hospital Stay: Payer: Medicare Other

## 2018-07-22 DIAGNOSIS — D469 Myelodysplastic syndrome, unspecified: Secondary | ICD-10-CM | POA: Diagnosis not present

## 2018-07-22 DIAGNOSIS — Z452 Encounter for adjustment and management of vascular access device: Secondary | ICD-10-CM

## 2018-07-22 MED ORDER — TBO-FILGRASTIM 480 MCG/0.8ML ~~LOC~~ SOSY
480.0000 ug | PREFILLED_SYRINGE | Freq: Once | SUBCUTANEOUS | Status: AC
  Administered 2018-07-22: 480 ug via SUBCUTANEOUS

## 2018-07-22 MED ORDER — TBO-FILGRASTIM 480 MCG/0.8ML ~~LOC~~ SOSY
PREFILLED_SYRINGE | SUBCUTANEOUS | Status: AC
  Filled 2018-07-22: qty 0.8

## 2018-07-23 ENCOUNTER — Inpatient Hospital Stay: Payer: Medicare Other

## 2018-07-23 VITALS — BP 122/65 | HR 72 | Temp 98.6°F | Resp 18

## 2018-07-23 DIAGNOSIS — D469 Myelodysplastic syndrome, unspecified: Secondary | ICD-10-CM | POA: Diagnosis not present

## 2018-07-23 DIAGNOSIS — Z452 Encounter for adjustment and management of vascular access device: Secondary | ICD-10-CM

## 2018-07-23 LAB — CBC WITH DIFFERENTIAL (CANCER CENTER ONLY)
BAND NEUTROPHILS: 5 %
Basophils Absolute: 0 10*3/uL (ref 0.0–0.1)
Basophils Relative: 1 %
Eosinophils Absolute: 0 10*3/uL (ref 0.0–0.5)
Eosinophils Relative: 1 %
HEMATOCRIT: 20.7 % — AB (ref 39.0–52.0)
HEMOGLOBIN: 6.7 g/dL — AB (ref 13.0–17.0)
LYMPHS ABS: 0.6 10*3/uL — AB (ref 0.7–4.0)
LYMPHS PCT: 71 %
MCH: 29.3 pg (ref 26.0–34.0)
MCHC: 32.4 g/dL (ref 30.0–36.0)
MCV: 90.4 fL (ref 80.0–100.0)
MONO ABS: 0 10*3/uL — AB (ref 0.1–1.0)
MONOS PCT: 3 %
NEUTROS ABS: 0.2 10*3/uL — AB (ref 1.7–17.7)
Neutrophils Relative %: 19 %
Platelet Count: 12 10*3/uL — ABNORMAL LOW (ref 150–400)
RBC: 2.29 MIL/uL — AB (ref 4.22–5.81)
RDW: 12.1 % (ref 11.5–15.5)
WBC: 0.9 10*3/uL — AB (ref 4.0–10.5)
nRBC: 0 % (ref 0.0–0.2)

## 2018-07-23 LAB — PREPARE RBC (CROSSMATCH)

## 2018-07-23 LAB — SAMPLE TO BLOOD BANK

## 2018-07-23 MED ORDER — TBO-FILGRASTIM 480 MCG/0.8ML ~~LOC~~ SOSY
480.0000 ug | PREFILLED_SYRINGE | Freq: Once | SUBCUTANEOUS | Status: AC
  Administered 2018-07-23: 480 ug via SUBCUTANEOUS

## 2018-07-23 MED ORDER — SODIUM CHLORIDE 0.9% FLUSH
3.0000 mL | INTRAVENOUS | Status: AC | PRN
  Administered 2018-07-23: 3 mL
  Filled 2018-07-23: qty 10

## 2018-07-23 MED ORDER — HEPARIN SOD (PORK) LOCK FLUSH 100 UNIT/ML IV SOLN
250.0000 [IU] | INTRAVENOUS | Status: AC | PRN
  Administered 2018-07-23: 250 [IU]
  Filled 2018-07-23: qty 5

## 2018-07-23 MED ORDER — SODIUM CHLORIDE 0.9% IV SOLUTION
250.0000 mL | Freq: Once | INTRAVENOUS | Status: AC
  Administered 2018-07-23: 250 mL via INTRAVENOUS
  Filled 2018-07-23: qty 250

## 2018-07-23 MED ORDER — TBO-FILGRASTIM 480 MCG/0.8ML ~~LOC~~ SOSY
PREFILLED_SYRINGE | SUBCUTANEOUS | Status: AC
  Filled 2018-07-23: qty 0.8

## 2018-07-23 NOTE — Patient Instructions (Signed)

## 2018-07-24 ENCOUNTER — Inpatient Hospital Stay: Payer: Medicare Other

## 2018-07-24 VITALS — BP 129/69 | HR 76 | Temp 98.4°F

## 2018-07-24 DIAGNOSIS — Z452 Encounter for adjustment and management of vascular access device: Secondary | ICD-10-CM

## 2018-07-24 DIAGNOSIS — D469 Myelodysplastic syndrome, unspecified: Secondary | ICD-10-CM | POA: Diagnosis not present

## 2018-07-24 LAB — BPAM RBC
Blood Product Expiration Date: 201911192359
Blood Product Expiration Date: 201911272359
ISSUE DATE / TIME: 201910300949
ISSUE DATE / TIME: 201910300949
UNIT TYPE AND RH: 5100
Unit Type and Rh: 5100

## 2018-07-24 LAB — TYPE AND SCREEN
ABO/RH(D): O POS
ANTIBODY SCREEN: NEGATIVE
UNIT DIVISION: 0
Unit division: 0

## 2018-07-24 MED ORDER — TBO-FILGRASTIM 480 MCG/0.8ML ~~LOC~~ SOSY
480.0000 ug | PREFILLED_SYRINGE | Freq: Once | SUBCUTANEOUS | Status: AC
  Administered 2018-07-24: 480 ug via SUBCUTANEOUS

## 2018-07-24 MED ORDER — TBO-FILGRASTIM 480 MCG/0.8ML ~~LOC~~ SOSY
PREFILLED_SYRINGE | SUBCUTANEOUS | Status: AC
  Filled 2018-07-24: qty 0.8

## 2018-07-24 NOTE — Patient Instructions (Signed)
Tbo-Filgrastim injection What is this medicine? TBO-FILGRASTIM (T B O fil GRA stim) is a granulocyte colony-stimulating factor that stimulates the growth of neutrophils, a type of white blood cell important in the body's fight against infection. It is used to reduce the incidence of fever and infection in patients with certain types of cancer who are receiving chemotherapy that affects the bone marrow. This medicine may be used for other purposes; ask your health care provider or pharmacist if you have questions. COMMON BRAND NAME(S): Granix What should I tell my health care provider before I take this medicine? They need to know if you have any of these conditions: -bone scan or tests planned -kidney disease -sickle cell anemia -an unusual or allergic reaction to tbo-filgrastim, filgrastim, pegfilgrastim, other medicines, foods, dyes, or preservatives -pregnant or trying to get pregnant -breast-feeding How should I use this medicine? This medicine is for injection under the skin. If you get this medicine at home, you will be taught how to prepare and give this medicine. Refer to the Instructions for Use that come with your medication packaging. Use exactly as directed. Take your medicine at regular intervals. Do not take your medicine more often than directed. It is important that you put your used needles and syringes in a special sharps container. Do not put them in a trash can. If you do not have a sharps container, call your pharmacist or healthcare provider to get one. Talk to your pediatrician regarding the use of this medicine in children. Special care may be needed. Overdosage: If you think you have taken too much of this medicine contact a poison control center or emergency room at once. NOTE: This medicine is only for you. Do not share this medicine with others. What if I miss a dose? It is important not to miss your dose. Call your doctor or health care professional if you miss a  dose. What may interact with this medicine? This medicine may interact with the following medications: -medicines that may cause a release of neutrophils, such as lithium This list may not describe all possible interactions. Give your health care provider a list of all the medicines, herbs, non-prescription drugs, or dietary supplements you use. Also tell them if you smoke, drink alcohol, or use illegal drugs. Some items may interact with your medicine. What should I watch for while using this medicine? You may need blood work done while you are taking this medicine. What side effects may I notice from receiving this medicine? Side effects that you should report to your doctor or health care professional as soon as possible: -allergic reactions like skin rash, itching or hives, swelling of the face, lips, or tongue -blood in the urine -dark urine -dizziness -fast heartbeat -feeling faint -shortness of breath or breathing problems -signs and symptoms of infection like fever or chills; cough; or sore throat -signs and symptoms of kidney injury like trouble passing urine or change in the amount of urine -stomach or side pain, or pain at the shoulder -sweating -swelling of the legs, ankles, or abdomen -tiredness Side effects that usually do not require medical attention (report to your doctor or health care professional if they continue or are bothersome): -bone pain -headache -muscle pain -vomiting This list may not describe all possible side effects. Call your doctor for medical advice about side effects. You may report side effects to FDA at 1-800-FDA-1088. Where should I keep my medicine? Keep out of the reach of children. Store in a refrigerator between   2 and 8 degrees C (36 and 46 degrees F). Keep in carton to protect from light. Throw away this medicine if it is left out of the refrigerator for more than 5 consecutive days. Throw away any unused medicine after the expiration  date. NOTE: This sheet is a summary. It may not cover all possible information. If you have questions about this medicine, talk to your doctor, pharmacist, or health care provider.  2018 Elsevier/Gold Standard (2015-10-31 19:07:04)  

## 2018-07-25 ENCOUNTER — Inpatient Hospital Stay: Payer: Medicare Other | Attending: Oncology

## 2018-07-25 VITALS — BP 123/69 | HR 68 | Temp 97.7°F | Resp 18

## 2018-07-25 DIAGNOSIS — D469 Myelodysplastic syndrome, unspecified: Secondary | ICD-10-CM | POA: Insufficient documentation

## 2018-07-25 DIAGNOSIS — D61818 Other pancytopenia: Secondary | ICD-10-CM | POA: Insufficient documentation

## 2018-07-25 DIAGNOSIS — Z452 Encounter for adjustment and management of vascular access device: Secondary | ICD-10-CM

## 2018-07-25 MED ORDER — TBO-FILGRASTIM 480 MCG/0.8ML ~~LOC~~ SOSY
PREFILLED_SYRINGE | SUBCUTANEOUS | Status: AC
  Filled 2018-07-25: qty 0.8

## 2018-07-25 MED ORDER — TBO-FILGRASTIM 480 MCG/0.8ML ~~LOC~~ SOSY
480.0000 ug | PREFILLED_SYRINGE | Freq: Once | SUBCUTANEOUS | Status: AC
  Administered 2018-07-25: 480 ug via SUBCUTANEOUS

## 2018-07-26 ENCOUNTER — Inpatient Hospital Stay: Payer: Medicare Other

## 2018-07-26 VITALS — BP 129/68 | HR 77 | Temp 98.2°F | Resp 18

## 2018-07-26 DIAGNOSIS — Z452 Encounter for adjustment and management of vascular access device: Secondary | ICD-10-CM

## 2018-07-26 DIAGNOSIS — D469 Myelodysplastic syndrome, unspecified: Secondary | ICD-10-CM | POA: Diagnosis not present

## 2018-07-26 MED ORDER — TBO-FILGRASTIM 480 MCG/0.8ML ~~LOC~~ SOSY
PREFILLED_SYRINGE | SUBCUTANEOUS | Status: AC
  Filled 2018-07-26: qty 0.8

## 2018-07-26 MED ORDER — TBO-FILGRASTIM 480 MCG/0.8ML ~~LOC~~ SOSY
480.0000 ug | PREFILLED_SYRINGE | Freq: Once | SUBCUTANEOUS | Status: AC
  Administered 2018-07-26: 480 ug via SUBCUTANEOUS

## 2018-07-26 NOTE — Patient Instructions (Signed)
Tbo-Filgrastim injection What is this medicine? TBO-FILGRASTIM (T B O fil GRA stim) is a granulocyte colony-stimulating factor that stimulates the growth of neutrophils, a type of white blood cell important in the body's fight against infection. It is used to reduce the incidence of fever and infection in patients with certain types of cancer who are receiving chemotherapy that affects the bone marrow. This medicine may be used for other purposes; ask your health care provider or pharmacist if you have questions. COMMON BRAND NAME(S): Granix What should I tell my health care provider before I take this medicine? They need to know if you have any of these conditions: -bone scan or tests planned -kidney disease -sickle cell anemia -an unusual or allergic reaction to tbo-filgrastim, filgrastim, pegfilgrastim, other medicines, foods, dyes, or preservatives -pregnant or trying to get pregnant -breast-feeding How should I use this medicine? This medicine is for injection under the skin. If you get this medicine at home, you will be taught how to prepare and give this medicine. Refer to the Instructions for Use that come with your medication packaging. Use exactly as directed. Take your medicine at regular intervals. Do not take your medicine more often than directed. It is important that you put your used needles and syringes in a special sharps container. Do not put them in a trash can. If you do not have a sharps container, call your pharmacist or healthcare provider to get one. Talk to your pediatrician regarding the use of this medicine in children. Special care may be needed. Overdosage: If you think you have taken too much of this medicine contact a poison control center or emergency room at once. NOTE: This medicine is only for you. Do not share this medicine with others. What if I miss a dose? It is important not to miss your dose. Call your doctor or health care professional if you miss a  dose. What may interact with this medicine? This medicine may interact with the following medications: -medicines that may cause a release of neutrophils, such as lithium This list may not describe all possible interactions. Give your health care provider a list of all the medicines, herbs, non-prescription drugs, or dietary supplements you use. Also tell them if you smoke, drink alcohol, or use illegal drugs. Some items may interact with your medicine. What should I watch for while using this medicine? You may need blood work done while you are taking this medicine. What side effects may I notice from receiving this medicine? Side effects that you should report to your doctor or health care professional as soon as possible: -allergic reactions like skin rash, itching or hives, swelling of the face, lips, or tongue -blood in the urine -dark urine -dizziness -fast heartbeat -feeling faint -shortness of breath or breathing problems -signs and symptoms of infection like fever or chills; cough; or sore throat -signs and symptoms of kidney injury like trouble passing urine or change in the amount of urine -stomach or side pain, or pain at the shoulder -sweating -swelling of the legs, ankles, or abdomen -tiredness Side effects that usually do not require medical attention (report to your doctor or health care professional if they continue or are bothersome): -bone pain -headache -muscle pain -vomiting This list may not describe all possible side effects. Call your doctor for medical advice about side effects. You may report side effects to FDA at 1-800-FDA-1088. Where should I keep my medicine? Keep out of the reach of children. Store in a refrigerator between   2 and 8 degrees C (36 and 46 degrees F). Keep in carton to protect from light. Throw away this medicine if it is left out of the refrigerator for more than 5 consecutive days. Throw away any unused medicine after the expiration  date. NOTE: This sheet is a summary. It may not cover all possible information. If you have questions about this medicine, talk to your doctor, pharmacist, or health care provider.  2018 Elsevier/Gold Standard (2015-10-31 19:07:04)  

## 2018-07-28 ENCOUNTER — Inpatient Hospital Stay (HOSPITAL_BASED_OUTPATIENT_CLINIC_OR_DEPARTMENT_OTHER): Payer: Medicare Other | Admitting: Nurse Practitioner

## 2018-07-28 ENCOUNTER — Encounter: Payer: Self-pay | Admitting: Nurse Practitioner

## 2018-07-28 ENCOUNTER — Telehealth: Payer: Self-pay

## 2018-07-28 ENCOUNTER — Inpatient Hospital Stay: Payer: Medicare Other

## 2018-07-28 VITALS — BP 136/71 | HR 87 | Temp 98.7°F | Resp 18 | Ht 67.0 in | Wt 140.0 lb

## 2018-07-28 DIAGNOSIS — D709 Neutropenia, unspecified: Secondary | ICD-10-CM | POA: Diagnosis not present

## 2018-07-28 DIAGNOSIS — D469 Myelodysplastic syndrome, unspecified: Secondary | ICD-10-CM | POA: Diagnosis not present

## 2018-07-28 LAB — CBC WITH DIFFERENTIAL (CANCER CENTER ONLY)
Basophils Absolute: 0 10*3/uL (ref 0.0–0.1)
Basophils Relative: 0 %
EOS ABS: 0 10*3/uL (ref 0.0–0.5)
EOS PCT: 0 %
HEMATOCRIT: 27.9 % — AB (ref 39.0–52.0)
Hemoglobin: 9.2 g/dL — ABNORMAL LOW (ref 13.0–17.0)
LYMPHS ABS: 1.1 10*3/uL (ref 0.7–4.0)
LYMPHS PCT: 59 %
MCH: 29.2 pg (ref 26.0–34.0)
MCHC: 33 g/dL (ref 30.0–36.0)
MCV: 88.6 fL (ref 80.0–100.0)
MONOS PCT: 1 %
Monocytes Absolute: 0 10*3/uL — ABNORMAL LOW (ref 0.1–1.0)
NRBC: 0 % (ref 0.0–0.2)
Neutro Abs: 0.8 10*3/uL — ABNORMAL LOW (ref 1.7–17.7)
Neutrophils Relative %: 40 %
PLATELETS: 20 10*3/uL — AB (ref 150–400)
RBC: 3.15 MIL/uL — ABNORMAL LOW (ref 4.22–5.81)
RDW: 12.1 % (ref 11.5–15.5)
WBC Count: 1.9 10*3/uL — ABNORMAL LOW (ref 4.0–10.5)

## 2018-07-28 LAB — SAMPLE TO BLOOD BANK

## 2018-07-28 MED ORDER — HEPARIN SOD (PORK) LOCK FLUSH 100 UNIT/ML IV SOLN
500.0000 [IU] | Freq: Once | INTRAVENOUS | Status: AC
  Administered 2018-07-28: 250 [IU] via INTRAVENOUS
  Filled 2018-07-28: qty 5

## 2018-07-28 MED ORDER — SODIUM CHLORIDE 0.9% FLUSH
3.0000 mL | INTRAVENOUS | Status: AC
  Administered 2018-07-28: 3 mL via INTRAVENOUS
  Filled 2018-07-28: qty 10

## 2018-07-28 NOTE — Telephone Encounter (Signed)
Printed avs and calender of upcoming appointment. Per 1/14 los 

## 2018-07-28 NOTE — Progress Notes (Addendum)
  [facility] OFFICE PROGRESS NOTE   Diagnosis: Myelodysplasia  INTERVAL HISTORY:   Andrew [name] returns as scheduled.  He feels well.  He denies bleeding.  No fever.  No shortness of breath.  He has a good energy level.  No pain at the sacrum.  Bowels moving regularly.  He was last transfused 2 units of blood on 07/23/2018.  Objective:  Vital signs in last 24 hours:  Blood pressure 136/71, pulse 87, temperature 98.7 F (37.1 C), temperature source Oral, resp. rate 18, height '5\' 7"'$  (1.702 m), weight 140 lb (63.5 kg), SpO2 100 %.    HEENT: Mild white coating over tongue. Resp: Lungs clear bilaterally. Cardio: Regular rate and rhythm. GI: Abdomen soft and nontender.  No hepatosplenomegaly. Vascular: No leg edema. Neuro: Alert and oriented. Skin: Persistent ulcerations at the perineum, appear clean. Right upper extremity PICC without erythema.  Lab Results:  Lab Results  Component Value Date   WBC 1.9 (L) 07/28/2018   HGB 9.2 (L) 07/28/2018   HCT 27.9 (L) 07/28/2018   MCV 88.6 07/28/2018   PLT 20 (L) 07/28/2018   NEUTROABS 0.8 (L) 07/28/2018    Imaging:  No results found.  Medications: I have reviewed the patient's current medications.  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  Cycle 1 5-azacytidine 05/06/2018  Cycle25-azacytidine 06/16/2018  Bone marrow biopsy 07/09/2018-markedly hypocellular with decreased myeloid elements. No blast population. Felt to likely be related to a treatment effect.  Trial of G-CSF 07/14/2018-07/26/2018 2.Renal insufficiency 3.Diabetes 4.Hemoccult positive stool  Upper endoscopy 04/03/2018-chronic  gastritis, duodenitis  Colonoscopy 04/04/2018-diverticulosis in the sigmoid colon 5.Right lower lobe 7 mm subpleural nodule on chest CT 04/02/2018 6.Admission with febrile neutropenia 05/20/2018- placed on cefepime, antibiotics changed to vancomycin/meropenem on 05/21/2018, anidulafungin added 9/1/2019antibiotics stopped 05/31/2018, discharge 06/03/2018 with prophylactic acyclovir, Bactrim, and Diflucan. Bactrim discontinued and Levaquin started 06/05/2018 7. 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 Admission with fever and severe neutropenia 07/07/2018   Disposition: Andrew [name] appears stable.  We reviewed the CBC from today.  Blood counts are showing some evidence of recovery.  We will discontinue G-CSF.  No transfusions today.  He will return for repeat labs 08/01/2018.  We will see him in follow-up with labs on 08/04/2018.  He understands to contact the office in the interim with any problems.  We specifically discussed fever, chills, other signs of infection, bleeding.  Patient seen with Dr. [name].    [name] ANP/GNP-BC   07/28/2018  11:50 AM  This was a shared visit with [name].  Andrew [name] has evidence of hematologic recovery.  No indication for transfusion at present.  We discontinued G-CSF.  He will return for a CBC on 08/01/2018.  We will consider initiating dose adjusted 5 azacytidine when there is more full hematologic recovery.  [name], MD

## 2018-07-30 ENCOUNTER — Inpatient Hospital Stay: Payer: Medicare Other

## 2018-07-30 DIAGNOSIS — Z452 Encounter for adjustment and management of vascular access device: Secondary | ICD-10-CM

## 2018-07-30 DIAGNOSIS — D469 Myelodysplastic syndrome, unspecified: Secondary | ICD-10-CM | POA: Diagnosis not present

## 2018-07-30 MED ORDER — HEPARIN SOD (PORK) LOCK FLUSH 100 UNIT/ML IV SOLN
500.0000 [IU] | Freq: Once | INTRAVENOUS | Status: AC
  Administered 2018-07-30: 500 [IU]
  Filled 2018-07-30: qty 5

## 2018-07-30 MED ORDER — SODIUM CHLORIDE 0.9% FLUSH
10.0000 mL | Freq: Once | INTRAVENOUS | Status: AC
  Administered 2018-07-30: 10 mL
  Filled 2018-07-30: qty 10

## 2018-08-01 ENCOUNTER — Inpatient Hospital Stay: Payer: Medicare Other

## 2018-08-01 DIAGNOSIS — Z452 Encounter for adjustment and management of vascular access device: Secondary | ICD-10-CM

## 2018-08-01 DIAGNOSIS — D469 Myelodysplastic syndrome, unspecified: Secondary | ICD-10-CM | POA: Diagnosis not present

## 2018-08-01 LAB — CBC WITH DIFFERENTIAL (CANCER CENTER ONLY)
Abs Immature Granulocytes: 0 10*3/uL (ref 0.00–0.07)
BASOS ABS: 0 10*3/uL (ref 0.0–0.1)
BASOS PCT: 0 %
EOS PCT: 1 %
Eosinophils Absolute: 0 10*3/uL (ref 0.0–0.5)
HCT: 23.7 % — ABNORMAL LOW (ref 39.0–52.0)
Hemoglobin: 7.8 g/dL — ABNORMAL LOW (ref 13.0–17.0)
IMMATURE GRANULOCYTES: 0 %
Lymphocytes Relative: 59 %
Lymphs Abs: 1 10*3/uL (ref 0.7–4.0)
MCH: 29.2 pg (ref 26.0–34.0)
MCHC: 32.9 g/dL (ref 30.0–36.0)
MCV: 88.8 fL (ref 80.0–100.0)
MONO ABS: 0.1 10*3/uL (ref 0.1–1.0)
Monocytes Relative: 5 %
NEUTROS ABS: 0.6 10*3/uL — AB (ref 1.7–7.7)
NRBC: 0 % (ref 0.0–0.2)
Neutrophils Relative %: 35 %
Platelet Count: 30 10*3/uL — ABNORMAL LOW (ref 150–400)
RBC: 2.67 MIL/uL — AB (ref 4.22–5.81)
RDW: 11.9 % (ref 11.5–15.5)
WBC: 1.7 10*3/uL — AB (ref 4.0–10.5)

## 2018-08-01 LAB — SAMPLE TO BLOOD BANK

## 2018-08-01 MED ORDER — SODIUM CHLORIDE 0.9% FLUSH
10.0000 mL | Freq: Once | INTRAVENOUS | Status: AC
  Administered 2018-08-01: 10 mL
  Filled 2018-08-01: qty 10

## 2018-08-01 MED ORDER — HEPARIN SOD (PORK) LOCK FLUSH 100 UNIT/ML IV SOLN
500.0000 [IU] | Freq: Once | INTRAVENOUS | Status: AC
  Administered 2018-08-01: 250 [IU]
  Filled 2018-08-01: qty 5

## 2018-08-04 ENCOUNTER — Inpatient Hospital Stay (HOSPITAL_BASED_OUTPATIENT_CLINIC_OR_DEPARTMENT_OTHER): Payer: Medicare Other | Admitting: Nurse Practitioner

## 2018-08-04 ENCOUNTER — Encounter: Payer: Self-pay | Admitting: Nurse Practitioner

## 2018-08-04 ENCOUNTER — Inpatient Hospital Stay: Payer: Medicare Other

## 2018-08-04 VITALS — BP 106/62 | HR 63 | Temp 98.3°F | Resp 18 | Ht 67.0 in | Wt 143.1 lb

## 2018-08-04 DIAGNOSIS — D469 Myelodysplastic syndrome, unspecified: Secondary | ICD-10-CM | POA: Diagnosis not present

## 2018-08-04 DIAGNOSIS — D709 Neutropenia, unspecified: Secondary | ICD-10-CM

## 2018-08-04 DIAGNOSIS — Z452 Encounter for adjustment and management of vascular access device: Secondary | ICD-10-CM

## 2018-08-04 LAB — CBC WITH DIFFERENTIAL (CANCER CENTER ONLY)
Abs Immature Granulocytes: 0.01 10*3/uL (ref 0.00–0.07)
Basophils Absolute: 0 10*3/uL (ref 0.0–0.1)
Basophils Relative: 1 %
EOS ABS: 0 10*3/uL (ref 0.0–0.5)
EOS PCT: 1 %
HCT: 23.5 % — ABNORMAL LOW (ref 39.0–52.0)
HEMOGLOBIN: 7.7 g/dL — AB (ref 13.0–17.0)
Immature Granulocytes: 1 %
LYMPHS PCT: 61 %
Lymphs Abs: 1.3 10*3/uL (ref 0.7–4.0)
MCH: 29.3 pg (ref 26.0–34.0)
MCHC: 32.8 g/dL (ref 30.0–36.0)
MCV: 89.4 fL (ref 80.0–100.0)
MONO ABS: 0.1 10*3/uL (ref 0.1–1.0)
Monocytes Relative: 3 %
Neutro Abs: 0.7 10*3/uL — ABNORMAL LOW (ref 1.7–7.7)
Neutrophils Relative %: 33 %
Platelet Count: 39 10*3/uL — ABNORMAL LOW (ref 150–400)
RBC: 2.63 MIL/uL — ABNORMAL LOW (ref 4.22–5.81)
RDW: 12 % (ref 11.5–15.5)
WBC Count: 2 10*3/uL — ABNORMAL LOW (ref 4.0–10.5)
nRBC: 1 % — ABNORMAL HIGH (ref 0.0–0.2)

## 2018-08-04 LAB — SAMPLE TO BLOOD BANK

## 2018-08-04 MED ORDER — SODIUM CHLORIDE 0.9% FLUSH
10.0000 mL | Freq: Once | INTRAVENOUS | Status: AC
  Administered 2018-08-04: 10 mL
  Filled 2018-08-04: qty 10

## 2018-08-04 MED ORDER — HEPARIN SOD (PORK) LOCK FLUSH 100 UNIT/ML IV SOLN
500.0000 [IU] | Freq: Once | INTRAVENOUS | Status: AC
  Administered 2018-08-04: 250 [IU]
  Filled 2018-08-04: qty 5

## 2018-08-04 NOTE — Progress Notes (Signed)
  [facility] OFFICE PROGRESS NOTE   Diagnosis: Myelodysplasia  INTERVAL HISTORY:   Andrew [name] returns as scheduled.  He feels well.  No fever or chills.  No bleeding.  He denies pain.  Sacral wound is healing.  No shortness of breath.  Objective:  Vital signs in last 24 hours:  Blood pressure (!) 97/56, pulse 63, temperature 98.3 F (36.8 C), temperature source Oral, resp. rate 18, height '5\' 7"'$  (1.702 m), weight 143 lb 1.6 oz (64.9 kg), SpO2 100 %.    HEENT: No thrush or ulcers. Resp: Lungs clear bilaterally. Cardio: Regular rate and rhythm. GI: Abdomen soft and nontender.  No hepatosplenomegaly. Vascular: No leg edema.  Skin: Sacral wound appears clean, may be slightly smaller. Right upper extremity PICC without erythema.   Lab Results:  Lab Results  Component Value Date   WBC 2.0 (L) 08/04/2018   HGB 7.7 (L) 08/04/2018   HCT 23.5 (L) 08/04/2018   MCV 89.4 08/04/2018   PLT 39 (L) 08/04/2018   NEUTROABS PENDING 08/04/2018    Imaging:  No results found.  Medications: I have reviewed the patient's current medications.  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  Cycle 1 5-azacytidine 05/06/2018  Cycle25-azacytidine 06/16/2018  Bone marrow biopsy 07/09/2018-markedly hypocellular with decreased myeloid elements. No blast population. Felt to likely be related to a treatment effect.  Trial of G-CSF 07/14/2018-07/26/2018 2.Renal insufficiency 3.Diabetes 4.Hemoccult positive stool  Upper endoscopy 04/03/2018-chronic gastritis, duodenitis  Colonoscopy 04/04/2018-diverticulosis in the sigmoid colon 5.Right lower lobe 7 mm subpleural  nodule on chest CT 04/02/2018 6.Admission with febrile neutropenia 05/20/2018- placed on cefepime, antibiotics changed to vancomycin/meropenem on 05/21/2018, anidulafungin added 9/1/2019antibiotics stopped 05/31/2018, discharge 06/03/2018 with prophylactic acyclovir, Bactrim, and Diflucan. Bactrim discontinued and Levaquin started 06/05/2018 7. 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 Admission with fever and severe neutropenia 07/07/2018  Disposition: Andrew [name] appears stable.  We reviewed the CBC from today.  Hemoglobin remains stable.  White count overall is stable.  Platelet count is better.  He understands to contact the office with fever, chills, other signs of infection, bleeding.  We will repeat a CBC in 1 week.  He will return for PICC line flushes on 08/06/2018 and 08/08/2018.  We will see him in follow-up with labs in 1 week.  He will contact the office in the interim as outlined above or with any other problems.  Plan reviewed with Dr. [name].    [name] ANP/GNP-BC   08/04/2018  10:46 AM

## 2018-08-05 ENCOUNTER — Telehealth: Payer: Self-pay | Admitting: Nurse Practitioner

## 2018-08-05 NOTE — Telephone Encounter (Signed)
Scheduled appt per 11/11 los - pt is aware of appts added - stated he will come by to pick up a schedule.

## 2018-08-06 ENCOUNTER — Inpatient Hospital Stay: Payer: Medicare Other

## 2018-08-06 VITALS — BP 94/61 | HR 58 | Temp 98.3°F | Resp 17

## 2018-08-06 DIAGNOSIS — Z452 Encounter for adjustment and management of vascular access device: Secondary | ICD-10-CM

## 2018-08-06 DIAGNOSIS — D469 Myelodysplastic syndrome, unspecified: Secondary | ICD-10-CM | POA: Diagnosis not present

## 2018-08-06 MED ORDER — SODIUM CHLORIDE 0.9% FLUSH
10.0000 mL | Freq: Once | INTRAVENOUS | Status: AC
  Administered 2018-08-06: 10 mL
  Filled 2018-08-06: qty 10

## 2018-08-06 MED ORDER — HEPARIN SOD (PORK) LOCK FLUSH 100 UNIT/ML IV SOLN
500.0000 [IU] | Freq: Once | INTRAVENOUS | Status: AC
  Administered 2018-08-06: 500 [IU]
  Filled 2018-08-06: qty 5

## 2018-08-06 NOTE — Patient Instructions (Signed)
PICC Home Guide °A peripherally inserted central catheter (PICC) is a long, thin, flexible tube that is inserted into a vein in the upper arm. It is a form of intravenous (IV) access. It is considered to be a "central" line because the tip of the PICC ends in a large vein in your chest. This large vein is called the superior vena cava (SVC). The PICC tip ends in the SVC because there is a lot of blood flow in the SVC. This allows medicines and IV fluids to be quickly distributed throughout the body. The PICC is inserted using a sterile technique by a specially trained nurse or physician. After the PICC is inserted, a chest X-ray exam is done to be sure it is in the correct place. °A PICC may be placed for different reasons, such as: °· To give medicines and liquid nutrition that can only be given through a central line. Examples are: °? Certain antibiotic treatments. °? Chemotherapy. °? Total parenteral nutrition (TPN). °· To take frequent blood samples. °· To give IV fluids and blood products. °· If there is difficulty placing a peripheral intravenous (PIV) catheter. ° °If taken care of properly, a PICC can remain in place for several months. A PICC can also allow a person to go home from the hospital early. Medicine and PICC care can be managed at home by a family member or home health care team. °What problems can happen when I have a PICC? °Problems with a PICC can occasionally occur. These may include the following: °· A blood clot (thrombus) forming in or at the tip of the PICC. This can cause the PICC to become clogged. A clot-dissolving medicine called tissue plasminogen activator (tPA) can be given through the PICC to help break up the clot. °· Inflammation of the vein (phlebitis) in which the PICC is placed. Signs of inflammation may include redness, pain at the insertion site, red streaks, or being able to feel a "cord" in the vein where the PICC is located. °· Infection in the PICC or at the insertion  site. Signs of infection may include fever, chills, redness, swelling, or pus drainage from the PICC insertion site. °· PICC movement (malposition). The PICC tip may move from its original position due to excessive physical activity, forceful coughing, sneezing, or vomiting. °· A break or cut in the PICC. It is important to not use scissors near the PICC. °· Nerve or tendon irritation or injury during PICC insertion. ° °What should I keep in mind about activities when I have a PICC? °· You may bend your arm and move it freely. If your PICC is near or at the bend of your elbow, avoid activity with repeated motion at the elbow. °· Rest at home for the remainder of the day following PICC line insertion. °· Avoid lifting heavy objects as instructed by your health care provider. °· Avoid using a crutch with the arm on the same side as your PICC. You may need to use a walker. °What should I know about my PICC dressing? °· Keep your PICC bandage (dressing) clean and dry to prevent infection. °? Ask your health care provider when you may shower. Ask your health care provider to teach you how to wrap the PICC when you do take a shower. °· Change the PICC dressing as instructed by your health care provider. °· Change your PICC dressing if it becomes loose or wet. °What should I know about PICC care? °· Check the PICC insertion   site daily for leakage, redness, swelling, or pain. °· Do not take a bath, swim, or use hot tubs when you have a PICC. Cover PICC line with clear plastic wrap and tape to keep it dry while showering. °· Flush the PICC as directed by your health care provider. Let your health care provider know right away if the PICC is difficult to flush or does not flush. Do not use force to flush the PICC. °· Do not use a syringe that is less than 10 mL to flush the PICC. °· Never pull or tug on the PICC. °· Avoid blood pressure checks on the arm with the PICC. °· Keep your PICC identification card with you at all  times. °· Do not take the PICC out yourself. Only a trained clinical professional should remove the PICC. °Get help right away if: °· Your PICC is accidentally pulled all the way out. If this happens, cover the insertion site with a bandage or gauze dressing. Do not throw the PICC away. Your health care provider will need to inspect it. °· Your PICC was tugged or pulled and has partially come out. Do not  push the PICC back in. °· There is any type of drainage, redness, or swelling where the PICC enters the skin. °· You cannot flush the PICC, it is difficult to flush, or the PICC leaks around the insertion site when it is flushed. °· You hear a "flushing" sound when the PICC is flushed. °· You have pain, discomfort, or numbness in your arm, shoulder, or jaw on the same side as the PICC. °· You feel your heart "racing" or skipping beats. °· You notice a hole or tear in the PICC. °· You develop chills or a fever. °This information is not intended to replace advice given to you by your health care provider. Make sure you discuss any questions you have with your health care provider. °Document Released: 03/17/2003 Document Revised: 03/30/2016 Document Reviewed: 07/03/2013 °Elsevier Interactive Patient Education © 2017 [employer]vier Inc. ° °

## 2018-08-07 ENCOUNTER — Other Ambulatory Visit: Payer: Self-pay | Admitting: Oncology

## 2018-08-07 DIAGNOSIS — D61818 Other pancytopenia: Secondary | ICD-10-CM

## 2018-08-08 ENCOUNTER — Inpatient Hospital Stay: Payer: Medicare Other

## 2018-08-08 DIAGNOSIS — D469 Myelodysplastic syndrome, unspecified: Secondary | ICD-10-CM | POA: Diagnosis not present

## 2018-08-08 DIAGNOSIS — Z452 Encounter for adjustment and management of vascular access device: Secondary | ICD-10-CM

## 2018-08-08 MED ORDER — SODIUM CHLORIDE 0.9% FLUSH
10.0000 mL | Freq: Once | INTRAVENOUS | Status: AC
  Administered 2018-08-08: 10 mL
  Filled 2018-08-08: qty 10

## 2018-08-08 MED ORDER — HEPARIN SOD (PORK) LOCK FLUSH 100 UNIT/ML IV SOLN
500.0000 [IU] | Freq: Once | INTRAVENOUS | Status: AC
  Administered 2018-08-08: 250 [IU]
  Filled 2018-08-08: qty 5

## 2018-08-11 ENCOUNTER — Inpatient Hospital Stay (HOSPITAL_BASED_OUTPATIENT_CLINIC_OR_DEPARTMENT_OTHER): Payer: Medicare Other | Admitting: Nurse Practitioner

## 2018-08-11 ENCOUNTER — Encounter: Payer: Self-pay | Admitting: Nurse Practitioner

## 2018-08-11 ENCOUNTER — Inpatient Hospital Stay: Payer: Medicare Other

## 2018-08-11 VITALS — BP 128/68 | HR 82 | Temp 98.2°F | Resp 18 | Ht 67.0 in | Wt 145.1 lb

## 2018-08-11 DIAGNOSIS — D469 Myelodysplastic syndrome, unspecified: Secondary | ICD-10-CM | POA: Diagnosis not present

## 2018-08-11 DIAGNOSIS — E119 Type 2 diabetes mellitus without complications: Secondary | ICD-10-CM

## 2018-08-11 DIAGNOSIS — Z9221 Personal history of antineoplastic chemotherapy: Secondary | ICD-10-CM

## 2018-08-11 DIAGNOSIS — N289 Disorder of kidney and ureter, unspecified: Secondary | ICD-10-CM

## 2018-08-11 DIAGNOSIS — Z452 Encounter for adjustment and management of vascular access device: Secondary | ICD-10-CM

## 2018-08-11 LAB — CBC WITH DIFFERENTIAL (CANCER CENTER ONLY)
Abs Immature Granulocytes: 0 10*3/uL (ref 0.00–0.07)
BASOS PCT: 0 %
Basophils Absolute: 0 10*3/uL (ref 0.0–0.1)
EOS ABS: 0 10*3/uL (ref 0.0–0.5)
Eosinophils Relative: 2 %
HCT: 23.1 % — ABNORMAL LOW (ref 39.0–52.0)
Hemoglobin: 7.4 g/dL — ABNORMAL LOW (ref 13.0–17.0)
Immature Granulocytes: 0 %
LYMPHS ABS: 1.4 10*3/uL (ref 0.7–4.0)
Lymphocytes Relative: 67 %
MCH: 29.2 pg (ref 26.0–34.0)
MCHC: 32 g/dL (ref 30.0–36.0)
MCV: 91.3 fL (ref 80.0–100.0)
MONO ABS: 0.1 10*3/uL (ref 0.1–1.0)
MONOS PCT: 3 %
Neutro Abs: 0.6 10*3/uL — ABNORMAL LOW (ref 1.7–7.7)
Neutrophils Relative %: 28 %
PLATELETS: 60 10*3/uL — AB (ref 150–400)
RBC: 2.53 MIL/uL — ABNORMAL LOW (ref 4.22–5.81)
RDW: 14 % (ref 11.5–15.5)
WBC Count: 2 10*3/uL — ABNORMAL LOW (ref 4.0–10.5)
nRBC: 0 % (ref 0.0–0.2)

## 2018-08-11 LAB — SAMPLE TO BLOOD BANK

## 2018-08-11 MED ORDER — HEPARIN SOD (PORK) LOCK FLUSH 100 UNIT/ML IV SOLN
500.0000 [IU] | Freq: Once | INTRAVENOUS | Status: AC
  Administered 2018-08-11: 250 [IU]
  Filled 2018-08-11: qty 5

## 2018-08-11 MED ORDER — SODIUM CHLORIDE 0.9% FLUSH
10.0000 mL | Freq: Once | INTRAVENOUS | Status: AC
  Administered 2018-08-11: 10 mL
  Filled 2018-08-11: qty 10

## 2018-08-11 NOTE — Progress Notes (Signed)
  [facility] OFFICE PROGRESS NOTE   Diagnosis: Myelodysplasia  INTERVAL HISTORY:   Andrew [name] returns as scheduled.  He feels well.  No fever or chills.  No bleeding.  He denies shortness of breath.  Sacral ulcer continues to heal.  Bowels are moving.  Objective:  Vital signs in last 24 hours:  Blood pressure 128/68, pulse 82, temperature 98.2 F (36.8 C), temperature source Oral, resp. rate 18, height 5' 7" (1.702 m), weight 145 lb 1.6 oz (65.8 kg), SpO2 100 %.    HEENT: No thrush or ulcers. Resp: Lungs clear bilaterally. Cardio: Regular rate and rhythm. GI: Abdomen soft and nontender.  No hepatomegaly.  No splenomegaly. Vascular: No leg edema.  Skin: Sacral wounds appear clean, smaller. Right upper extremity PIC without erythema.   Lab Results:  Lab Results  Component Value Date   WBC 2.0 (L) 08/11/2018   HGB 7.4 (L) 08/11/2018   HCT 23.1 (L) 08/11/2018   MCV 91.3 08/11/2018   PLT 60 (L) 08/11/2018   NEUTROABS PENDING 08/11/2018    Imaging:  No results found.  Medications: I have reviewed the patient's current medications.  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  Cycle 1 5-azacytidine 05/06/2018  Cycle25-azacytidine 06/16/2018  Bone marrow biopsy 07/09/2018-markedly hypocellular with decreased myeloid elements. No blast population. Felt to likely be related to a treatment effect.  Trial of G-CSF 07/14/2018-07/26/2018 2.Renal insufficiency 3.Diabetes 4.Hemoccult positive stool  Upper endoscopy 04/03/2018-chronic gastritis, duodenitis  Colonoscopy 04/04/2018-diverticulosis in the sigmoid colon 5.Right lower lobe 7 mm  subpleural nodule on chest CT 04/02/2018 6.Admission with febrile neutropenia 05/20/2018- placed on cefepime, antibiotics changed to vancomycin/meropenem on 05/21/2018, anidulafungin added 9/1/2019antibiotics stopped 05/31/2018, discharge 06/03/2018 with prophylactic acyclovir, Bactrim, and Diflucan. Bactrim discontinued and Levaquin started 06/05/2018 7. 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 Admission with fever and severe neutropenia 07/07/2018  Disposition: Andrew [name] appears stable.  We reviewed the CBC from today.  Hemoglobin and white count remain stable.  The platelet count is better.  He will return in 1 week for a follow-up CBC.  He will return in 2 weeks for lab, follow-up and possibly cycle three 5-azacytidine.  He will continue PICC line care on a Monday Wednesday Friday schedule.  He understands to contact the office prior to his next visit with any problems.  We specifically discussed fever, chills, other signs of infection, bleeding.  We reviewed signs/symptoms suggestive of progressive anemia.  Plan reviewed with Dr. [name].    [name] ANP/GNP-BC   08/11/2018  2:00 PM

## 2018-08-12 ENCOUNTER — Telehealth: Payer: Self-pay | Admitting: Emergency Medicine

## 2018-08-12 NOTE — Telephone Encounter (Signed)
Per [name] NP, patient can stop taking diflucan. Called the patient and he verbalized understanding of this .

## 2018-08-13 ENCOUNTER — Encounter (HOSPITAL_COMMUNITY): Payer: Self-pay | Admitting: Oncology

## 2018-08-13 ENCOUNTER — Other Ambulatory Visit: Payer: Self-pay

## 2018-08-13 ENCOUNTER — Inpatient Hospital Stay: Payer: Medicare Other

## 2018-08-13 DIAGNOSIS — Z452 Encounter for adjustment and management of vascular access device: Secondary | ICD-10-CM

## 2018-08-13 DIAGNOSIS — D469 Myelodysplastic syndrome, unspecified: Secondary | ICD-10-CM | POA: Diagnosis not present

## 2018-08-13 MED ORDER — HEPARIN SOD (PORK) LOCK FLUSH 100 UNIT/ML IV SOLN
500.0000 [IU] | Freq: Once | INTRAVENOUS | Status: AC
  Administered 2018-08-13: 250 [IU]
  Filled 2018-08-13: qty 5

## 2018-08-13 MED ORDER — SODIUM CHLORIDE 0.9% FLUSH
10.0000 mL | Freq: Once | INTRAVENOUS | Status: AC
  Administered 2018-08-13: 10 mL
  Filled 2018-08-13: qty 10

## 2018-08-15 ENCOUNTER — Inpatient Hospital Stay: Payer: Medicare Other

## 2018-08-15 ENCOUNTER — Other Ambulatory Visit: Payer: Self-pay

## 2018-08-15 DIAGNOSIS — Z452 Encounter for adjustment and management of vascular access device: Secondary | ICD-10-CM

## 2018-08-15 DIAGNOSIS — D469 Myelodysplastic syndrome, unspecified: Secondary | ICD-10-CM | POA: Diagnosis not present

## 2018-08-15 MED ORDER — HEPARIN SOD (PORK) LOCK FLUSH 100 UNIT/ML IV SOLN
500.0000 [IU] | Freq: Once | INTRAVENOUS | Status: AC
  Administered 2018-08-15: 500 [IU]
  Filled 2018-08-15: qty 5

## 2018-08-15 MED ORDER — SODIUM CHLORIDE 0.9% FLUSH
10.0000 mL | Freq: Once | INTRAVENOUS | Status: AC
  Administered 2018-08-15: 10 mL
  Filled 2018-08-15: qty 10

## 2018-08-18 ENCOUNTER — Inpatient Hospital Stay: Payer: Medicare Other

## 2018-08-18 ENCOUNTER — Telehealth: Payer: Self-pay | Admitting: *Deleted

## 2018-08-18 ENCOUNTER — Ambulatory Visit: Payer: Self-pay | Admitting: Nurse Practitioner

## 2018-08-18 DIAGNOSIS — D469 Myelodysplastic syndrome, unspecified: Secondary | ICD-10-CM

## 2018-08-18 DIAGNOSIS — D61818 Other pancytopenia: Secondary | ICD-10-CM

## 2018-08-18 DIAGNOSIS — Z452 Encounter for adjustment and management of vascular access device: Secondary | ICD-10-CM

## 2018-08-18 LAB — CBC WITH DIFFERENTIAL (CANCER CENTER ONLY)
Abs Immature Granulocytes: 0 10*3/uL (ref 0.00–0.07)
BASOS ABS: 0 10*3/uL (ref 0.0–0.1)
BASOS PCT: 0 %
Eosinophils Absolute: 0 10*3/uL (ref 0.0–0.5)
Eosinophils Relative: 2 %
HCT: 21.8 % — ABNORMAL LOW (ref 39.0–52.0)
Hemoglobin: 7.2 g/dL — ABNORMAL LOW (ref 13.0–17.0)
IMMATURE GRANULOCYTES: 0 %
LYMPHS PCT: 42 %
Lymphs Abs: 1 10*3/uL (ref 0.7–4.0)
MCH: 30.9 pg (ref 26.0–34.0)
MCHC: 33 g/dL (ref 30.0–36.0)
MCV: 93.6 fL (ref 80.0–100.0)
Monocytes Absolute: 0.1 10*3/uL (ref 0.1–1.0)
Monocytes Relative: 5 %
NEUTROS ABS: 1.2 10*3/uL — AB (ref 1.7–7.7)
NEUTROS PCT: 51 %
Platelet Count: 52 10*3/uL — ABNORMAL LOW (ref 150–400)
RBC: 2.33 MIL/uL — AB (ref 4.22–5.81)
RDW: 17.9 % — AB (ref 11.5–15.5)
WBC: 2.4 10*3/uL — AB (ref 4.0–10.5)
nRBC: 0.8 % — ABNORMAL HIGH (ref 0.0–0.2)

## 2018-08-18 LAB — SAMPLE TO BLOOD BANK

## 2018-08-18 MED ORDER — SODIUM CHLORIDE 0.9% FLUSH
10.0000 mL | Freq: Once | INTRAVENOUS | Status: AC
  Administered 2018-08-18: 10 mL
  Filled 2018-08-18: qty 10

## 2018-08-18 MED ORDER — HEPARIN SOD (PORK) LOCK FLUSH 100 UNIT/ML IV SOLN
500.0000 [IU] | Freq: Once | INTRAVENOUS | Status: AC
  Administered 2018-08-18: 500 [IU]
  Filled 2018-08-18: qty 5

## 2018-08-18 NOTE — Patient Instructions (Signed)
PICC Home Guide °A peripherally inserted central catheter (PICC) is a long, thin, flexible tube that is inserted into a vein in the upper arm. It is a form of intravenous (IV) access. It is considered to be a "central" line because the tip of the PICC ends in a large vein in your chest. This large vein is called the superior vena cava (SVC). The PICC tip ends in the SVC because there is a lot of blood flow in the SVC. This allows medicines and IV fluids to be quickly distributed throughout the body. The PICC is inserted using a sterile technique by a specially trained nurse or physician. After the PICC is inserted, a chest X-ray exam is done to be sure it is in the correct place. °A PICC may be placed for different reasons, such as: °· To give medicines and liquid nutrition that can only be given through a central line. Examples are: °? Certain antibiotic treatments. °? Chemotherapy. °? Total parenteral nutrition (TPN). °· To take frequent blood samples. °· To give IV fluids and blood products. °· If there is difficulty placing a peripheral intravenous (PIV) catheter. ° °If taken care of properly, a PICC can remain in place for several months. A PICC can also allow a person to go home from the hospital early. Medicine and PICC care can be managed at home by a family member or home health care team. °What problems can happen when I have a PICC? °Problems with a PICC can occasionally occur. These may include the following: °· A blood clot (thrombus) forming in or at the tip of the PICC. This can cause the PICC to become clogged. A clot-dissolving medicine called tissue plasminogen activator (tPA) can be given through the PICC to help break up the clot. °· Inflammation of the vein (phlebitis) in which the PICC is placed. Signs of inflammation may include redness, pain at the insertion site, red streaks, or being able to feel a "cord" in the vein where the PICC is located. °· Infection in the PICC or at the insertion  site. Signs of infection may include fever, chills, redness, swelling, or pus drainage from the PICC insertion site. °· PICC movement (malposition). The PICC tip may move from its original position due to excessive physical activity, forceful coughing, sneezing, or vomiting. °· A break or cut in the PICC. It is important to not use scissors near the PICC. °· Nerve or tendon irritation or injury during PICC insertion. ° °What should I keep in mind about activities when I have a PICC? °· You may bend your arm and move it freely. If your PICC is near or at the bend of your elbow, avoid activity with repeated motion at the elbow. °· Rest at home for the remainder of the day following PICC line insertion. °· Avoid lifting heavy objects as instructed by your health care provider. °· Avoid using a crutch with the arm on the same side as your PICC. You may need to use a walker. °What should I know about my PICC dressing? °· Keep your PICC bandage (dressing) clean and dry to prevent infection. °? Ask your health care provider when you may shower. Ask your health care provider to teach you how to wrap the PICC when you do take a shower. °· Change the PICC dressing as instructed by your health care provider. °· Change your PICC dressing if it becomes loose or wet. °What should I know about PICC care? °· Check the PICC insertion   site daily for leakage, redness, swelling, or pain. °· Do not take a bath, swim, or use hot tubs when you have a PICC. Cover PICC line with clear plastic wrap and tape to keep it dry while showering. °· Flush the PICC as directed by your health care provider. Let your health care provider know right away if the PICC is difficult to flush or does not flush. Do not use force to flush the PICC. °· Do not use a syringe that is less than 10 mL to flush the PICC. °· Never pull or tug on the PICC. °· Avoid blood pressure checks on the arm with the PICC. °· Keep your PICC identification card with you at all  times. °· Do not take the PICC out yourself. Only a trained clinical professional should remove the PICC. °Get help right away if: °· Your PICC is accidentally pulled all the way out. If this happens, cover the insertion site with a bandage or gauze dressing. Do not throw the PICC away. Your health care provider will need to inspect it. °· Your PICC was tugged or pulled and has partially come out. Do not  push the PICC back in. °· There is any type of drainage, redness, or swelling where the PICC enters the skin. °· You cannot flush the PICC, it is difficult to flush, or the PICC leaks around the insertion site when it is flushed. °· You hear a "flushing" sound when the PICC is flushed. °· You have pain, discomfort, or numbness in your arm, shoulder, or jaw on the same side as the PICC. °· You feel your heart "racing" or skipping beats. °· You notice a hole or tear in the PICC. °· You develop chills or a fever. °This information is not intended to replace advice given to you by your health care provider. Make sure you discuss any questions you have with your health care provider. °Document Released: 03/17/2003 Document Revised: 03/30/2016 Document Reviewed: 07/03/2013 °Elsevier Interactive Patient Education © 2017 [employer]vier Inc. ° °

## 2018-08-18 NOTE — Telephone Encounter (Signed)
MD reviewed labs today: will transfuse if patient is symptomatic. Called patient and he denies any shortness of breath, excess fatigue or feeling weak or lightheaded. Does not feel he needs blood this week. Informed him we will follow up on 08/25/18 with repeat labs.

## 2018-08-20 ENCOUNTER — Other Ambulatory Visit: Payer: Self-pay

## 2018-08-20 ENCOUNTER — Inpatient Hospital Stay: Payer: Medicare Other

## 2018-08-22 ENCOUNTER — Inpatient Hospital Stay: Payer: Medicare Other

## 2018-08-22 ENCOUNTER — Other Ambulatory Visit: Payer: Self-pay

## 2018-08-25 ENCOUNTER — Inpatient Hospital Stay: Payer: Medicare Other

## 2018-08-25 ENCOUNTER — Inpatient Hospital Stay: Payer: Medicare Other | Attending: Oncology

## 2018-08-25 ENCOUNTER — Telehealth: Payer: Self-pay | Admitting: Oncology

## 2018-08-25 ENCOUNTER — Inpatient Hospital Stay (HOSPITAL_BASED_OUTPATIENT_CLINIC_OR_DEPARTMENT_OTHER): Payer: Medicare Other | Admitting: Nurse Practitioner

## 2018-08-25 ENCOUNTER — Encounter: Payer: Self-pay | Admitting: Nurse Practitioner

## 2018-08-25 VITALS — BP 143/66 | HR 96 | Temp 97.5°F | Resp 18 | Ht 67.0 in | Wt 156.9 lb

## 2018-08-25 DIAGNOSIS — E119 Type 2 diabetes mellitus without complications: Secondary | ICD-10-CM | POA: Diagnosis not present

## 2018-08-25 DIAGNOSIS — D709 Neutropenia, unspecified: Secondary | ICD-10-CM | POA: Diagnosis not present

## 2018-08-25 DIAGNOSIS — Z452 Encounter for adjustment and management of vascular access device: Secondary | ICD-10-CM

## 2018-08-25 DIAGNOSIS — R05 Cough: Secondary | ICD-10-CM | POA: Diagnosis not present

## 2018-08-25 DIAGNOSIS — Z95828 Presence of other vascular implants and grafts: Secondary | ICD-10-CM | POA: Diagnosis not present

## 2018-08-25 DIAGNOSIS — D469 Myelodysplastic syndrome, unspecified: Secondary | ICD-10-CM

## 2018-08-25 DIAGNOSIS — D61818 Other pancytopenia: Secondary | ICD-10-CM | POA: Diagnosis not present

## 2018-08-25 DIAGNOSIS — N289 Disorder of kidney and ureter, unspecified: Secondary | ICD-10-CM | POA: Insufficient documentation

## 2018-08-25 DIAGNOSIS — Z79899 Other long term (current) drug therapy: Secondary | ICD-10-CM | POA: Diagnosis not present

## 2018-08-25 DIAGNOSIS — K056 Periodontal disease, unspecified: Secondary | ICD-10-CM | POA: Diagnosis not present

## 2018-08-25 DIAGNOSIS — R0989 Other specified symptoms and signs involving the circulatory and respiratory systems: Secondary | ICD-10-CM | POA: Insufficient documentation

## 2018-08-25 DIAGNOSIS — Z792 Long term (current) use of antibiotics: Secondary | ICD-10-CM | POA: Diagnosis not present

## 2018-08-25 DIAGNOSIS — R58 Hemorrhage, not elsewhere classified: Secondary | ICD-10-CM | POA: Diagnosis not present

## 2018-08-25 LAB — CBC WITH DIFFERENTIAL (CANCER CENTER ONLY)
Abs Immature Granulocytes: 0 10*3/uL (ref 0.00–0.07)
BASOS ABS: 0 10*3/uL (ref 0.0–0.1)
Basophils Relative: 0 %
EOS ABS: 0 10*3/uL (ref 0.0–0.5)
EOS PCT: 2 %
HEMATOCRIT: 19.5 % — AB (ref 39.0–52.0)
HEMOGLOBIN: 6.6 g/dL — AB (ref 13.0–17.0)
Immature Granulocytes: 0 %
Lymphocytes Relative: 58 %
Lymphs Abs: 1 10*3/uL (ref 0.7–4.0)
MCH: 32 pg (ref 26.0–34.0)
MCHC: 33.8 g/dL (ref 30.0–36.0)
MCV: 94.7 fL (ref 80.0–100.0)
MONO ABS: 0.1 10*3/uL (ref 0.1–1.0)
Monocytes Relative: 6 %
NRBC: 1.2 % — AB (ref 0.0–0.2)
Neutro Abs: 0.6 10*3/uL — ABNORMAL LOW (ref 1.7–7.7)
Neutrophils Relative %: 34 %
PLATELETS: 30 10*3/uL — AB (ref 150–400)
RBC: 2.06 MIL/uL — AB (ref 4.22–5.81)
RDW: 20.8 % — ABNORMAL HIGH (ref 11.5–15.5)
WBC Morphology: 2
WBC: 1.7 10*3/uL — AB (ref 4.0–10.5)

## 2018-08-25 LAB — CMP (CANCER CENTER ONLY)
ALK PHOS: 169 U/L — AB (ref 38–126)
ALT: 11 U/L (ref 0–44)
AST: 12 U/L — AB (ref 15–41)
Albumin: 3.3 g/dL — ABNORMAL LOW (ref 3.5–5.0)
Anion gap: 8 (ref 5–15)
BILIRUBIN TOTAL: 0.3 mg/dL (ref 0.3–1.2)
BUN: 19 mg/dL (ref 8–23)
CALCIUM: 9 mg/dL (ref 8.9–10.3)
CO2: 26 mmol/L (ref 22–32)
Chloride: 104 mmol/L (ref 98–111)
Creatinine: 0.79 mg/dL (ref 0.61–1.24)
Glucose, Bld: 181 mg/dL — ABNORMAL HIGH (ref 70–99)
Potassium: 3.9 mmol/L (ref 3.5–5.1)
Sodium: 138 mmol/L (ref 135–145)
TOTAL PROTEIN: 7.6 g/dL (ref 6.5–8.1)

## 2018-08-25 LAB — SAMPLE TO BLOOD BANK

## 2018-08-25 MED ORDER — HEPARIN SOD (PORK) LOCK FLUSH 100 UNIT/ML IV SOLN
500.0000 [IU] | Freq: Once | INTRAVENOUS | Status: AC
  Administered 2018-08-25: 250 [IU]
  Filled 2018-08-25: qty 5

## 2018-08-25 MED ORDER — SODIUM CHLORIDE 0.9% FLUSH
10.0000 mL | Freq: Once | INTRAVENOUS | Status: AC
  Administered 2018-08-25: 10 mL
  Filled 2018-08-25: qty 10

## 2018-08-25 NOTE — Telephone Encounter (Signed)
Gave patient avs report and appointments for December. Per LT 12/5 ok for blood - blood will be done at [facility] 12/5. Also per LT cxd picc Flush appointments for next week - patient will have injections only. LT will order/request additional picc flush appointments 12/6 if needed.

## 2018-08-25 NOTE — Progress Notes (Signed)
  [facility] OFFICE PROGRESS NOTE   Diagnosis: Myelodysplasia  INTERVAL HISTORY:   Mr. Reth returns as scheduled.  He denies fever.  No bleeding.  No shortness of breath.  He would like to receive a blood transfusion this week.  Objective:  Vital signs in last 24 hours:  Blood pressure (!) 143/66, pulse 96, temperature (!) 97.5 F (36.4 C), temperature source Oral, resp. rate 18, height '5\' 7"'$  (1.702 m), weight 156 lb 14.4 oz (71.2 kg), SpO2 100 %.    HEENT: No thrush or ulcers. Resp: Lungs clear bilaterally. Cardio: Regular rate and rhythm. GI: Abdomen soft and nontender.  No hepatosplenomegaly. Vascular: No leg edema. Skin: Small sacral ulcer, appears clean. Right upper extremity PICC without erythema.   Lab Results:  Lab Results  Component Value Date   WBC 1.7 (L) 08/25/2018   HGB 6.6 (LL) 08/25/2018   HCT 19.5 (L) 08/25/2018   MCV 94.7 08/25/2018   PLT 30 (L) 08/25/2018   NEUTROABS PENDING 08/25/2018    Imaging:  No results found.  Medications: I have reviewed the patient's current medications.  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  Cycle 1 5-azacytidine 05/06/2018  Cycle25-azacytidine 06/16/2018  Bone marrow biopsy 07/09/2018-markedly hypocellular with decreased myeloid elements. No blast population. Felt to likely be related to a treatment effect.  Trial of G-CSF 07/14/2018-07/26/2018 2.Renal insufficiency 3.Diabetes 4.Hemoccult positive stool  Upper endoscopy 04/03/2018-chronic gastritis, duodenitis  Colonoscopy 04/04/2018-diverticulosis in the sigmoid colon 5.Right lower lobe 7 mm subpleural nodule on chest  CT 04/02/2018 6.Admission with febrile neutropenia 05/20/2018- placed on cefepime, antibiotics changed to vancomycin/meropenem on 05/21/2018, anidulafungin added 9/1/2019antibiotics stopped 05/31/2018, discharge 06/03/2018 with prophylactic acyclovir, Bactrim, and Diflucan. Bactrim discontinued and Levaquin started 06/05/2018 7. 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 Admission with fever and severe neutropenia 07/07/2018  Disposition: Mr. Knotts appears stable.  The sacral wound continues to heal.  We reviewed the CBC from today.  He has progressive pancytopenia.  We reviewed neutropenic and thrombocytopenic precautions.  He understands to contact the office with fever, chills, other signs of infection, bleeding.  He will be transfused 2 units of blood this week.  We decided to cancel the 5-azacytidine scheduled to resume this week.  We will see him in follow-up on 08/29/2018 with repeat labs and decide on rescheduling the 5-azacytidine.  He will contact the office in the interim with any problems.  Plan reviewed with Dr. [name].    [name] ANP/GNP-BC   08/25/2018  2:45 PM

## 2018-08-26 ENCOUNTER — Inpatient Hospital Stay: Payer: Medicare Other

## 2018-08-27 ENCOUNTER — Inpatient Hospital Stay: Payer: Medicare Other

## 2018-08-27 ENCOUNTER — Other Ambulatory Visit: Payer: Self-pay

## 2018-08-27 ENCOUNTER — Ambulatory Visit: Payer: Self-pay

## 2018-08-27 DIAGNOSIS — Z452 Encounter for adjustment and management of vascular access device: Secondary | ICD-10-CM

## 2018-08-27 DIAGNOSIS — D469 Myelodysplastic syndrome, unspecified: Secondary | ICD-10-CM | POA: Diagnosis not present

## 2018-08-27 MED ORDER — HEPARIN SOD (PORK) LOCK FLUSH 100 UNIT/ML IV SOLN
500.0000 [IU] | Freq: Once | INTRAVENOUS | Status: AC
  Administered 2018-08-27: 500 [IU]
  Filled 2018-08-27: qty 5

## 2018-08-27 MED ORDER — SODIUM CHLORIDE 0.9% FLUSH
10.0000 mL | Freq: Once | INTRAVENOUS | Status: AC
  Administered 2018-08-27: 10 mL
  Filled 2018-08-27: qty 10

## 2018-08-28 ENCOUNTER — Ambulatory Visit (HOSPITAL_COMMUNITY)
Admission: RE | Admit: 2018-08-28 | Discharge: 2018-08-28 | Disposition: A | Discharge status: Home / Self Care | Payer: Medicare Other | Source: Ambulatory Visit | Attending: Oncology | Admitting: Oncology

## 2018-08-28 ENCOUNTER — Ambulatory Visit: Payer: Self-pay

## 2018-08-28 ENCOUNTER — Other Ambulatory Visit: Payer: Self-pay

## 2018-08-28 ENCOUNTER — Other Ambulatory Visit: Payer: Self-pay | Admitting: *Deleted

## 2018-08-28 DIAGNOSIS — D61818 Other pancytopenia: Secondary | ICD-10-CM | POA: Diagnosis not present

## 2018-08-28 DIAGNOSIS — D469 Myelodysplastic syndrome, unspecified: Secondary | ICD-10-CM | POA: Diagnosis present

## 2018-08-28 LAB — PREPARE RBC (CROSSMATCH)

## 2018-08-28 MED ORDER — ACETAMINOPHEN 325 MG PO TABS
650.0000 mg | ORAL_TABLET | Freq: Once | ORAL | Status: AC
  Administered 2018-08-28: 650 mg via ORAL
  Filled 2018-08-28: qty 2

## 2018-08-28 MED ORDER — ACETAMINOPHEN 325 MG PO TABS: 650.0000 mg | ORAL_TABLET | Freq: Once | ORAL | Status: DC

## 2018-08-28 MED ORDER — SODIUM CHLORIDE 0.9% IV SOLUTION
250.0000 mL | Freq: Once | INTRAVENOUS | Status: AC
  Administered 2018-08-28: 250 mL via INTRAVENOUS

## 2018-08-28 MED ORDER — SODIUM CHLORIDE 0.9% FLUSH
10.0000 mL | INTRAVENOUS | Status: AC | PRN
  Administered 2018-08-28: 10 mL

## 2018-08-28 MED ORDER — HEPARIN SOD (PORK) LOCK FLUSH 100 UNIT/ML IV SOLN
250.0000 [IU] | INTRAVENOUS | Status: AC | PRN
  Administered 2018-08-28: 250 [IU]
  Filled 2018-08-28: qty 5

## 2018-08-28 MED ORDER — DIPHENHYDRAMINE HCL 25 MG PO CAPS
25.0000 mg | ORAL_CAPSULE | Freq: Once | ORAL | Status: AC
  Administered 2018-08-28: 25 mg via ORAL
  Filled 2018-08-28: qty 1

## 2018-08-28 MED ORDER — DIPHENHYDRAMINE HCL 25 MG PO TABS
25.0000 mg | ORAL_TABLET | Freq: Once | ORAL | Status: DC
  Filled 2018-08-28: qty 1

## 2018-08-28 NOTE — Progress Notes (Signed)
Patient received 2 units of packed red blood cells via a PICC line. Pre transfusion Benadryl and Tylenol were administered as ordered.Tolerated well, vitals stable, discharge instructions given, verbalized understanding. Patient alert, oriented and ambulatory at the time of discharge.

## 2018-08-28 NOTE — Discharge Instructions (Signed)

## 2018-08-29 ENCOUNTER — Other Ambulatory Visit: Payer: Self-pay

## 2018-08-29 ENCOUNTER — Inpatient Hospital Stay: Payer: Medicare Other

## 2018-08-29 ENCOUNTER — Telehealth: Payer: Self-pay

## 2018-08-29 ENCOUNTER — Encounter: Payer: Self-pay | Admitting: Nurse Practitioner

## 2018-08-29 ENCOUNTER — Inpatient Hospital Stay (HOSPITAL_BASED_OUTPATIENT_CLINIC_OR_DEPARTMENT_OTHER): Payer: Medicare Other | Admitting: Nurse Practitioner

## 2018-08-29 ENCOUNTER — Ambulatory Visit: Payer: Self-pay

## 2018-08-29 VITALS — BP 120/75 | HR 68 | Temp 98.2°F | Resp 17 | Ht 67.0 in | Wt 151.9 lb

## 2018-08-29 DIAGNOSIS — D469 Myelodysplastic syndrome, unspecified: Secondary | ICD-10-CM | POA: Diagnosis not present

## 2018-08-29 DIAGNOSIS — R0989 Other specified symptoms and signs involving the circulatory and respiratory systems: Secondary | ICD-10-CM

## 2018-08-29 DIAGNOSIS — E119 Type 2 diabetes mellitus without complications: Secondary | ICD-10-CM

## 2018-08-29 DIAGNOSIS — D61818 Other pancytopenia: Secondary | ICD-10-CM

## 2018-08-29 DIAGNOSIS — K056 Periodontal disease, unspecified: Secondary | ICD-10-CM

## 2018-08-29 DIAGNOSIS — N289 Disorder of kidney and ureter, unspecified: Secondary | ICD-10-CM

## 2018-08-29 DIAGNOSIS — R05 Cough: Secondary | ICD-10-CM | POA: Diagnosis not present

## 2018-08-29 DIAGNOSIS — Z79899 Other long term (current) drug therapy: Secondary | ICD-10-CM

## 2018-08-29 DIAGNOSIS — Z452 Encounter for adjustment and management of vascular access device: Secondary | ICD-10-CM

## 2018-08-29 LAB — CBC WITH DIFFERENTIAL (CANCER CENTER ONLY)
Abs Immature Granulocytes: 0 10*3/uL (ref 0.00–0.07)
Basophils Absolute: 0 10*3/uL (ref 0.0–0.1)
Basophils Relative: 1 %
Blasts: 8 %
Eosinophils Absolute: 0.1 10*3/uL (ref 0.0–0.5)
Eosinophils Relative: 4 %
HEMATOCRIT: 28.9 % — AB (ref 39.0–52.0)
Hemoglobin: 9.8 g/dL — ABNORMAL LOW (ref 13.0–17.0)
Lymphocytes Relative: 39 %
Lymphs Abs: 0.9 10*3/uL (ref 0.7–4.0)
MCH: 31 pg (ref 26.0–34.0)
MCHC: 33.9 g/dL (ref 30.0–36.0)
MCV: 91.5 fL (ref 80.0–100.0)
Monocytes Absolute: 0.1 10*3/uL (ref 0.1–1.0)
Monocytes Relative: 5 %
NEUTROS ABS: 0.8 10*3/uL — AB (ref 1.7–17.7)
Neutrophils Relative %: 36 %
OTHER: 7 %
Platelet Count: 20 10*3/uL — ABNORMAL LOW (ref 150–400)
RBC: 3.16 MIL/uL — AB (ref 4.22–5.81)
RDW: 19.5 % — ABNORMAL HIGH (ref 11.5–15.5)
WBC Count: 2.2 10*3/uL — ABNORMAL LOW (ref 4.0–10.5)
nRBC: 0.9 % — ABNORMAL HIGH (ref 0.0–0.2)
nRBC: 1 /100 WBC — ABNORMAL HIGH

## 2018-08-29 LAB — BASIC METABOLIC PANEL - CANCER CENTER ONLY
Anion gap: 10 (ref 5–15)
BUN: 16 mg/dL (ref 8–23)
CO2: 22 mmol/L (ref 22–32)
Calcium: 9.4 mg/dL (ref 8.9–10.3)
Chloride: 104 mmol/L (ref 98–111)
Creatinine: 0.73 mg/dL (ref 0.61–1.24)
GFR, Est AFR Am: >60 mL/min (ref >60)
GFR, Estimated: >60 mL/min (ref >60)
Glucose, Bld: 103 mg/dL — ABNORMAL HIGH (ref 70–99)
Potassium: 4.7 mmol/L (ref 3.5–5.1)
Sodium: 136 mmol/L (ref 135–145)

## 2018-08-29 LAB — BPAM RBC
Blood Product Expiration Date: 201912312359
Blood Product Expiration Date: 201912312359
ISSUE DATE / TIME: 201912050859
ISSUE DATE / TIME: 201912050859
Unit Type and Rh: 5100
Unit Type and Rh: 5100

## 2018-08-29 LAB — TYPE AND SCREEN
ABO/RH(D): O POS
Antibody Screen: NEGATIVE
Unit division: 0
Unit division: 0

## 2018-08-29 MED ORDER — HEPARIN SOD (PORK) LOCK FLUSH 100 UNIT/ML IV SOLN
500.0000 [IU] | Freq: Once | INTRAVENOUS | Status: AC
  Administered 2018-08-29: 500 [IU]
  Filled 2018-08-29: qty 5

## 2018-08-29 MED ORDER — SODIUM CHLORIDE 0.9% FLUSH
10.0000 mL | Freq: Once | INTRAVENOUS | Status: AC
  Administered 2018-08-29: 10 mL
  Filled 2018-08-29: qty 10

## 2018-08-29 NOTE — Progress Notes (Signed)
  [name] OFFICE PROGRESS NOTE   Diagnosis: Myelodysplasia  INTERVAL HISTORY:   Andrew [name] returns as scheduled.  He was transfused 2 units of blood yesterday.  He feels well.  He denies any bleeding.  No fever.  He has a good appetite.  He denies shortness of breath.  He reports a mild cough and runny nose.  Objective:  Vital signs in last 24 hours:  Blood pressure 120/75, pulse 68, temperature 98.2 F (36.8 C), temperature source Oral, resp. rate 17, height '5\' 7"'$  (1.702 m), weight 151 lb 14.4 oz (68.9 kg), SpO2 100 %.    HEENT: No thrush or ulcers. Resp: Lungs clear bilaterally. Cardio: Regular rate and rhythm. GI: Abdomen soft and nontender.  No hepatosplenomegaly. Vascular: No leg edema.  Skin: Small sacral ulcer. Right upper extremity PICC without erythema.   Lab Results:  Lab Results  Component Value Date   WBC 2.2 (L) 08/29/2018   HGB 9.8 (L) 08/29/2018   HCT 28.9 (L) 08/29/2018   MCV 91.5 08/29/2018   PLT 20 (L) 08/29/2018   NEUTROABS PENDING 08/29/2018    Imaging:  No results found.  Medications: I have reviewed the patient's current medications.  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  Cycle 1 5-azacytidine 05/06/2018  Cycle25-azacytidine 06/16/2018  Bone marrow biopsy 07/09/2018-markedly hypocellular with decreased myeloid elements. No blast population. Felt to likely be related to a treatment effect.  Trial of G-CSF 07/14/2018-07/26/2018 2.Renal insufficiency 3.Diabetes 4.Hemoccult positive stool  Upper endoscopy 04/03/2018-chronic gastritis, duodenitis  Colonoscopy 04/04/2018-diverticulosis in the sigmoid  colon 5.Right lower lobe 7 mm subpleural nodule on chest CT 04/02/2018 6.Admission with febrile neutropenia 05/20/2018- placed on cefepime, antibiotics changed to vancomycin/meropenem on 05/21/2018, anidulafungin added 9/1/2019antibiotics stopped 05/31/2018, discharge 06/03/2018 with prophylactic acyclovir, Bactrim, and Diflucan. Bactrim discontinued and Levaquin started 06/05/2018 7. 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 Admission with fever and severe neutropenia 07/07/2018  Disposition: Andrew [name] appears stable.  We reviewed the CBC from today.  Hemoglobin is better following the recent blood transfusion.  The white count is higher, neutrophil count pending.  The platelet count is lower.  Dr. [name] recommends proceeding with a repeat bone marrow biopsy early next week.  He will return for lab and a follow-up visit on 09/05/2018 to review the results.  He will contact the office in the interim with any problems.  We specifically discussed fever, chills, other signs of infection, bleeding.  Patient seen with Dr. [name].    [name] ANP/GNP-BC   08/29/2018  12:41 PM  This was a shared visit with [name].  Andrew [name] was interviewed and examined.  The perineal ulcers are healing.  He has persistent severe pancytopenia.  He will undergo a restaging bone marrow biopsy prior to considering further systemic therapy.  I will contact the leukemia and bone marrow transplant services at [facility] when we have the bone marrow result in hand.  [name], MD

## 2018-08-29 NOTE — Telephone Encounter (Signed)
Printed avs and calender of upcoming appointment. Per 12/6 los 

## 2018-08-29 NOTE — Patient Instructions (Signed)

## 2018-09-01 ENCOUNTER — Inpatient Hospital Stay: Payer: Medicare Other

## 2018-09-01 ENCOUNTER — Other Ambulatory Visit: Payer: Self-pay

## 2018-09-01 DIAGNOSIS — D469 Myelodysplastic syndrome, unspecified: Secondary | ICD-10-CM | POA: Diagnosis not present

## 2018-09-01 DIAGNOSIS — Z452 Encounter for adjustment and management of vascular access device: Secondary | ICD-10-CM

## 2018-09-01 MED ORDER — HEPARIN SOD (PORK) LOCK FLUSH 100 UNIT/ML IV SOLN
500.0000 [IU] | Freq: Once | INTRAVENOUS | Status: AC
  Administered 2018-09-01: 250 [IU]
  Filled 2018-09-01: qty 5

## 2018-09-01 MED ORDER — SODIUM CHLORIDE 0.9% FLUSH
10.0000 mL | Freq: Once | INTRAVENOUS | Status: AC
  Administered 2018-09-01: 10 mL
  Filled 2018-09-01: qty 10

## 2018-09-02 ENCOUNTER — Inpatient Hospital Stay: Payer: Medicare Other

## 2018-09-03 ENCOUNTER — Ambulatory Visit: Payer: Self-pay

## 2018-09-03 ENCOUNTER — Other Ambulatory Visit: Payer: Self-pay | Admitting: Radiology

## 2018-09-03 ENCOUNTER — Inpatient Hospital Stay: Payer: Medicare Other

## 2018-09-03 ENCOUNTER — Other Ambulatory Visit: Payer: Self-pay

## 2018-09-03 DIAGNOSIS — Z452 Encounter for adjustment and management of vascular access device: Secondary | ICD-10-CM

## 2018-09-03 DIAGNOSIS — D469 Myelodysplastic syndrome, unspecified: Secondary | ICD-10-CM | POA: Diagnosis not present

## 2018-09-03 MED ORDER — SODIUM CHLORIDE 0.9% FLUSH
10.0000 mL | Freq: Once | INTRAVENOUS | Status: AC
  Administered 2018-09-03: 10 mL
  Filled 2018-09-03: qty 10

## 2018-09-03 MED ORDER — HEPARIN SOD (PORK) LOCK FLUSH 100 UNIT/ML IV SOLN
500.0000 [IU] | Freq: Once | INTRAVENOUS | Status: AC
  Administered 2018-09-03: 250 [IU]
  Filled 2018-09-03: qty 5

## 2018-09-04 ENCOUNTER — Ambulatory Visit (HOSPITAL_COMMUNITY)
Admission: RE | Admit: 2018-09-04 | Discharge: 2018-09-04 | Disposition: A | Discharge status: Home / Self Care | Payer: Medicare Other | Source: Ambulatory Visit | Attending: Nurse Practitioner | Admitting: Nurse Practitioner

## 2018-09-04 ENCOUNTER — Ambulatory Visit: Payer: Self-pay

## 2018-09-04 ENCOUNTER — Encounter (HOSPITAL_COMMUNITY): Payer: Self-pay

## 2018-09-04 DIAGNOSIS — D61818 Other pancytopenia: Secondary | ICD-10-CM | POA: Diagnosis not present

## 2018-09-04 DIAGNOSIS — D469 Myelodysplastic syndrome, unspecified: Secondary | ICD-10-CM | POA: Insufficient documentation

## 2018-09-04 DIAGNOSIS — Z87891 Personal history of nicotine dependence: Secondary | ICD-10-CM | POA: Insufficient documentation

## 2018-09-04 DIAGNOSIS — Z79899 Other long term (current) drug therapy: Secondary | ICD-10-CM | POA: Insufficient documentation

## 2018-09-04 LAB — PROTIME-INR
INR: 0.95
Prothrombin Time: 12.6 seconds (ref 11.4–15.2)

## 2018-09-04 MED ORDER — HEPARIN SOD (PORK) LOCK FLUSH 100 UNIT/ML IV SOLN: 250.0000 [IU] | INTRAVENOUS | Status: DC | PRN

## 2018-09-04 MED ORDER — FENTANYL CITRATE (PF) 100 MCG/2ML IJ SOLN
INTRAMUSCULAR | Status: AC | PRN
  Administered 2018-09-04 (×2): 50 ug via INTRAVENOUS

## 2018-09-04 MED ORDER — HEPARIN SOD (PORK) LOCK FLUSH 100 UNIT/ML IV SOLN
INTRAVENOUS | Status: AC
  Filled 2018-09-04: qty 5

## 2018-09-04 MED ORDER — MIDAZOLAM HCL 2 MG/2ML IJ SOLN
INTRAMUSCULAR | Status: AC
  Filled 2018-09-04: qty 4

## 2018-09-04 MED ORDER — HEPARIN SOD (PORK) LOCK FLUSH 100 UNIT/ML IV SOLN
250.0000 [IU] | Freq: Every day | INTRAVENOUS | Status: DC
  Administered 2018-09-04: 250 [IU]

## 2018-09-04 MED ORDER — MIDAZOLAM HCL 2 MG/2ML IJ SOLN
INTRAMUSCULAR | Status: AC | PRN
  Administered 2018-09-04 (×2): 1 mg via INTRAVENOUS

## 2018-09-04 MED ORDER — LIDOCAINE HCL (PF) 1 % IJ SOLN
INTRAMUSCULAR | Status: AC | PRN
  Administered 2018-09-04: 10 mL

## 2018-09-04 MED ORDER — FENTANYL CITRATE (PF) 100 MCG/2ML IJ SOLN
INTRAMUSCULAR | Status: AC
  Filled 2018-09-04: qty 2

## 2018-09-04 MED ORDER — SODIUM CHLORIDE 0.9 % IV SOLN
INTRAVENOUS | Status: DC
  Administered 2018-09-04: 08:00:00 via INTRAVENOUS

## 2018-09-04 NOTE — Progress Notes (Signed)
CRITICAL VALUE ALERT  Critical Value: platlet valve 7  Date & Time Notied: 09/04/2018 0834  Provider Notified: called by Anderson Malta in Lab to Nash Dimmer RN which informed Ascencion Dike PA   Orders Received/Actions taken: no orders given

## 2018-09-04 NOTE — H&P (Signed)
Chief Complaint: Patient was seen in consultation today for  at the request of [name] NP  Referring Physician(s): [name] NP  Supervising Physician: Aletta Edouard  Patient Status: White  History of Present Illness: Andrew [name] is a 67 y.o. male with myelodysplasia. He sees [name] and Dr. [name]. Had a BM bx back in October and is now referred back for repeat bone marrow biopsy to reassess. PMHx, meds, labs, imaging, allergies reviewed. Feels well, no recent fevers, chills, illness. Has been NPO today as directed. Family at bedside.   Past Medical History:  Diagnosis Date  . Abdominal bloating    probable lactose intolorence  . Anemia 04/03/2018  . Asthma   . COPD (chronic obstructive pulmonary disease) (HCC)    mild improvement  . DM (diabetes mellitus) ([place])   . Hearing impairment    asymptomatic  . HTN (hypertension)   . Tobacco use   . Vitamin D deficiency     Past Surgical History:  Procedure Laterality Date  . BIOPSY  04/03/2018   Procedure: BIOPSY;  Surgeon: Otis Brace, MD;  Location: [facility];  Service: Gastroenterology;;  . COLONOSCOPY WITH PROPOFOL N/A 04/04/2018   Procedure: COLONOSCOPY WITH PROPOFOL;  Surgeon: Otis Brace, MD;  Location: [facility];  Service: Gastroenterology;  Laterality: N/A;  . ESOPHAGOGASTRODUODENOSCOPY (EGD) WITH PROPOFOL N/A 04/03/2018   Procedure: ESOPHAGOGASTRODUODENOSCOPY (EGD) WITH PROPOFOL;  Surgeon: Otis Brace, MD;  Location: [facility];  Service: Gastroenterology;  Laterality: N/A;  . EYE SURGERY      Allergies: Shellfish allergy  Medications: Prior to Admission medications   Medication Sig Start Date End Date Taking? Authorizing Provider  acyclovir (ZOVIRAX) 400 MG tablet Take 1 tablet (400 mg total) by mouth 2 (two) times daily. 06/03/18  Yes Owens Shark, NP  benazepril (LOTENSIN) 40 MG tablet  08/07/18  Yes [provider]  chlorhexidine (PERIDEX)  0.12 % solution Use as directed 15 mLs in the mouth or throat 2 (two) times daily. 06/03/18  Yes Owens Shark, NP  dorzolamide-timolol (COSOPT) 22.3-6.8 MG/ML ophthalmic solution Place 1 drop into both eyes 2 (two) times daily. 02/22/18  Yes [provider]  fluconazole (DIFLUCAN) 50 MG tablet TAKE 1 TABLET(50 MG) BY MOUTH DAILY 07/18/18  Yes Ladell Pier, MD  metoprolol tartrate (LOPRESSOR) 25 MG tablet Take 25 mg by mouth 2 (two) times daily.    Yes [provider]  montelukast (SINGULAIR) 10 MG tablet Take 10 mg by mouth daily.  05/10/18  Yes [provider]  pantoprazole (PROTONIX) 40 MG tablet Take 1 tablet (40 mg total) by mouth 2 (two) times daily. 04/04/18  Yes Hosie Poisson, MD  tetrahydrozoline-zinc (VISINE-AC) 0.05-0.25 % ophthalmic solution Place 2 drops into both eyes as needed (redness).    Yes [provider]  TRAVATAN Z 0.004 % SOLN ophthalmic solution Place 1 drop into both eyes every evening. 02/22/18  Yes [provider]  VENTOLIN HFA 108 (90 Base) MCG/ACT inhaler Inhale 1-2 puffs into the lungs 4 (four) times daily as needed. 02/15/18  Yes [provider]  amoxicillin-clavulanate (AUGMENTIN) 875-125 MG tablet Take 1 tablet by mouth every 12 (twelve) hours. 07/11/18   Thurnell Lose, MD  gemfibrozil (LOPID) 600 MG tablet  08/07/18   [provider]  prochlorperazine (COMPAZINE) 5 MG tablet TAKE 1 TABLET(5 MG) BY MOUTH EVERY 6 HOURS AS NEEDED FOR NAUSEA OR VOMITING Patient taking differently: Take 5 mg by mouth every 6 (six) hours  as needed for nausea or vomiting.  05/27/18   Owens Shark, NP  sorbitol 70 % solution Take 15 mLs by mouth daily as needed. Patient taking differently: Take 15 mLs by mouth daily as needed (constipation).  06/17/18   Ladell Pier, MD  traMADol (ULTRAM) 50 MG tablet Take 1 tablet (50 mg total) by mouth every 6 (six) hours as needed. 06/25/18   Ladell Pier, MD     History reviewed. No  pertinent family history.  Social History   Socioeconomic History  . Marital status: Single    Spouse name: Not on file  . Number of children: 1  . Years of education: 82  . Highest education level: Bachelor's degree (e.g., BA, AB, BS)  Occupational History  . Occupation: retired  Scientific laboratory technician  . Financial resource strain: Not hard at all  . Food insecurity:    Worry: Never true    Inability: Never true  . Transportation needs:    Medical: No    Non-medical: No  Tobacco Use  . Smoking status: Former Smoker    Packs/day: 0.25    Types: Cigarettes    Last attempt to quit: 03/02/2018    Years since quitting: 0.5  . Smokeless tobacco: Never Used  Substance and Sexual Activity  . Alcohol use: Yes    Frequency: Never    Comment: occ  . Drug use: Never  . Sexual activity: Not Currently  Lifestyle  . Physical activity:    Days per week: 0 days    Minutes per session: 0 min  . Stress: Not at all  Relationships  . Social connections:    Talks on phone: More than three times a week    Gets together: More than three times a week    Attends religious service: More than 4 times per year    Active member of club or organization: Yes    Attends meetings of clubs or organizations: More than 4 times per year    Relationship status: Divorced  Other Topics Concern  . Not on file  Social History Narrative  . Not on file     Review of Systems: A 12 point ROS discussed and pertinent positives are indicated in the HPI above.  All other systems are negative.  Review of Systems  Vital Signs: T: 98.7  HR: 86, RR: 18, BP: 131/77  Physical Exam Constitutional:      Appearance: Normal appearance.  HENT:     Mouth/Throat:     Mouth: Mucous membranes are moist.     Pharynx: Oropharynx is clear.  Cardiovascular:     Rate and Rhythm: Normal rate and regular rhythm.     Heart sounds: Normal heart sounds.  Pulmonary:     Effort: Pulmonary effort is normal. No respiratory distress.      Breath sounds: Normal breath sounds.  Skin:    General: Skin is warm and dry.  Neurological:     General: No focal deficit present.     Mental Status: He is alert and oriented to person, place, and time.  Psychiatric:        Mood and Affect: Mood normal.        Judgment: Judgment normal.      Imaging: No results found.  Labs:  CBC: Recent Labs    08/18/18 1129 08/25/18 1310 08/29/18 1057 09/04/18 0730  WBC 2.4* 1.7* 2.2* 1.6*  HGB 7.2* 6.6* 9.8* 8.8*  HCT 21.8* 19.5* 28.9* 27.2*  PLT  52* 30* 20* 7*    COAGS: Recent Labs    04/03/18 0802 09/04/18 0730  INR 1.08 0.95    BMP: Recent Labs    07/14/18 1009 07/16/18 0940 08/25/18 1310 08/29/18 1057  NA 129* 132* 138 136  K 4.0 4.0 3.9 4.7  CL 95* 97* 104 104  CO2 '23 24 26 22  '$ GLUCOSE 145* 157* 181* 103*  BUN '22 18 19 16  '$ CALCIUM 9.6 9.4 9.0 9.4  CREATININE 1.02 0.74 0.79 0.73  GFRNONAA >60 >60 >60 >60  GFRAA >60 >60 >60 >60    LIVER FUNCTION TESTS: Recent Labs    07/10/18 0343 07/14/18 1009 07/16/18 0940 08/25/18 1310  BILITOT 0.9 1.2 0.8 0.3  AST 20 43* 18 12*  ALT 30 68* 50* 11  ALKPHOS 192* 332* 310* 169*  PROT 6.8 8.3* 8.2* 7.6  ALBUMIN 1.9* 2.0* 1.9* 3.3*    TUMOR MARKERS: No results for input(s): AFPTM, CEA, CA199, CHROMGRNA in the last 8760 hours.  Assessment and Plan: Myelodysplasia For CT guided bone marrow biopsy today Labs reviewed. Risks and benefits discussed with the patient including, but not limited to bleeding, infection, damage to adjacent structures or low yield requiring additional tests.  All of the patient's questions were answered, patient is agreeable to proceed. Consent signed and in chart.    Thank you for this interesting consult.  I greatly enjoyed meeting Andrew [name] and look forward to participating in their care.  A copy of this report was sent to the requesting provider on this date.  Electronically Signed: Ascencion Dike, PA-C 09/04/2018, 8:41  AM   I spent a total of bone marrow biopsy in face to face in clinical consultation, greater than 50% of which was counseling/coordinating care for bone marrow biopsy

## 2018-09-04 NOTE — Discharge Instructions (Signed)
Bone Marrow Aspiration and Bone Marrow Biopsy, Adult, Care After °This sheet gives you information about how to care for yourself after your procedure. Your health care provider may also give you more specific instructions. If you have problems or questions, contact your health care provider. °What can I expect after the procedure? °After the procedure, it is common to have: °· Mild pain and tenderness. °· Swelling. °· Bruising. ° °Follow these instructions at home: °· Take over-the-counter or prescription medicines only as told by your health care provider. °· Do not take baths, swim, or use a hot tub until your health care provider approves. Ask if you can take a shower or have a sponge bath. °· Follow instructions from your health care provider about how to take care of the puncture site. Make sure you: °? Wash your hands with soap and water before you change your bandage (dressing). If soap and water are not available, use hand sanitizer. °? Change your dressing as told by your health care provider. °· Check your puncture site every day for signs of infection. Check for: °? More redness, swelling, or pain. °? More fluid or blood. °? Warmth. °? Pus or a bad smell. °· Return to your normal activities as told by your health care provider. Ask your health care provider what activities are safe for you. °· Do not drive for 24 hours if you were given a medicine to help you relax (sedative). °· Keep all follow-up visits as told by your health care provider. This is important. °Contact a health care provider if: °· You have more redness, swelling, or pain around the puncture site. °· You have more fluid or blood coming from the puncture site. °· Your puncture site feels warm to the touch. °· You have pus or a bad smell coming from the puncture site. °· You have a fever. °· Your pain is not controlled with medicine. °This information is not intended to replace advice given to you by your health care provider. Make sure  you discuss any questions you have with your health care provider. °Document Released: 03/30/2005 Document Revised: 03/30/2016 Document Reviewed: 02/22/2016 °Elsevier Interactive Patient Education © 2018 Elsevier Inc. ° °Moderate Conscious Sedation, Adult, Care After °These instructions provide you with information about caring for yourself after your procedure. Your health care provider may also give you more specific instructions. Your treatment has been planned according to current medical practices, but problems sometimes occur. Call your health care provider if you have any problems or questions after your procedure. °What can I expect after the procedure? °After your procedure, it is common: °· To feel sleepy for several hours. °· To feel clumsy and have poor balance for several hours. °· To have poor judgment for several hours. °· To vomit if you eat too soon. ° °Follow these instructions at home: °For at least 24 hours after the procedure: ° °· Do not: °? Participate in activities where you could fall or become injured. °? Drive. °? Use heavy machinery. °? Drink alcohol. °? Take sleeping pills or medicines that cause drowsiness. °? Make important decisions or sign legal documents. °? Take care of children on your own. °· Rest. °Eating and drinking °· Follow the diet recommended by your health care provider. °· If you vomit: °? Drink water, juice, or soup when you can drink without vomiting. °? Make sure you have little or no nausea before eating solid foods. °General instructions °· Have a responsible adult stay with you until you   are awake and alert.  Take over-the-counter and prescription medicines only as told by your health care provider.  If you smoke, do not smoke without supervision.  Keep all follow-up visits as told by your health care provider. This is important. Contact a health care provider if:  You keep feeling nauseous or you keep vomiting.  You feel light-headed.  You develop a  rash.  You have a fever. Get help right away if:  You have trouble breathing. This information is not intended to replace advice given to you by your health care provider. Make sure you discuss any questions you have with your health care provider. Document Released: 07/01/2013 Document Revised: 02/13/2016 Document Reviewed: 12/31/2015 Elsevier Interactive Patient Education  [employer].

## 2018-09-04 NOTE — Procedures (Signed)
Interventional Radiology Procedure Note  Procedure: CT guided bone marrow aspiration and biopsy  Complications: None  EBL: < 10 mL  Findings: Aspirate and core biopsy performed of bone marrow in right iliac bone.  Plan: Bedrest supine x 1 hrs  Kailen Name T. Caylin Raby, M.D Pager:  [phone]   

## 2018-09-05 ENCOUNTER — Inpatient Hospital Stay: Payer: Medicare Other

## 2018-09-05 ENCOUNTER — Other Ambulatory Visit: Payer: Self-pay | Admitting: *Deleted

## 2018-09-05 ENCOUNTER — Telehealth: Payer: Self-pay | Admitting: Oncology

## 2018-09-05 ENCOUNTER — Other Ambulatory Visit: Payer: Self-pay

## 2018-09-05 ENCOUNTER — Ambulatory Visit: Payer: Self-pay

## 2018-09-05 ENCOUNTER — Inpatient Hospital Stay (HOSPITAL_BASED_OUTPATIENT_CLINIC_OR_DEPARTMENT_OTHER): Payer: Medicare Other | Admitting: Oncology

## 2018-09-05 ENCOUNTER — Telehealth: Payer: Self-pay | Admitting: *Deleted

## 2018-09-05 VITALS — BP 129/56 | HR 78 | Temp 98.2°F | Resp 19 | Wt 154.1 lb

## 2018-09-05 DIAGNOSIS — Z95828 Presence of other vascular implants and grafts: Secondary | ICD-10-CM

## 2018-09-05 DIAGNOSIS — D61818 Other pancytopenia: Secondary | ICD-10-CM | POA: Diagnosis not present

## 2018-09-05 DIAGNOSIS — D469 Myelodysplastic syndrome, unspecified: Secondary | ICD-10-CM

## 2018-09-05 DIAGNOSIS — N289 Disorder of kidney and ureter, unspecified: Secondary | ICD-10-CM

## 2018-09-05 DIAGNOSIS — R0989 Other specified symptoms and signs involving the circulatory and respiratory systems: Secondary | ICD-10-CM

## 2018-09-05 DIAGNOSIS — Z452 Encounter for adjustment and management of vascular access device: Secondary | ICD-10-CM

## 2018-09-05 DIAGNOSIS — Z79899 Other long term (current) drug therapy: Secondary | ICD-10-CM

## 2018-09-05 DIAGNOSIS — R58 Hemorrhage, not elsewhere classified: Secondary | ICD-10-CM

## 2018-09-05 DIAGNOSIS — R05 Cough: Secondary | ICD-10-CM

## 2018-09-05 DIAGNOSIS — E119 Type 2 diabetes mellitus without complications: Secondary | ICD-10-CM

## 2018-09-05 LAB — CBC WITH DIFFERENTIAL/PLATELET
Abs Immature Granulocytes: 0 10*3/uL (ref 0.00–0.07)
BASOS ABS: 0 10*3/uL (ref 0.0–0.1)
Basophils Relative: 0 %
Blasts: 14 %
Eosinophils Absolute: 0 10*3/uL (ref 0.0–0.5)
Eosinophils Relative: 0 %
HEMATOCRIT: 27.2 % — AB (ref 39.0–52.0)
Hemoglobin: 8.8 g/dL — ABNORMAL LOW (ref 13.0–17.0)
Lymphocytes Relative: 63 %
Lymphs Abs: 1 10*3/uL (ref 0.7–4.0)
MCH: 30.7 pg (ref 26.0–34.0)
MCHC: 32.4 g/dL (ref 30.0–36.0)
MCV: 94.8 fL (ref 80.0–100.0)
Monocytes Absolute: 0.1 10*3/uL (ref 0.1–1.0)
Monocytes Relative: 8 %
NRBC: 0 % (ref 0.0–0.2)
Neutro Abs: 0.2 10*3/uL — ABNORMAL LOW (ref 1.7–7.7)
Neutrophils Relative %: 15 %
Platelets: 7 10*3/uL — CL (ref 150–400)
RBC: 2.87 MIL/uL — ABNORMAL LOW (ref 4.22–5.81)
RDW: 19.5 % — ABNORMAL HIGH (ref 11.5–15.5)
WBC: 1.6 10*3/uL — ABNORMAL LOW (ref 4.0–10.5)

## 2018-09-05 LAB — CBC WITH DIFFERENTIAL (CANCER CENTER ONLY)
Abs Immature Granulocytes: 0 10*3/uL (ref 0.00–0.07)
Basophils Absolute: 0 10*3/uL (ref 0.0–0.1)
Basophils Relative: 0 %
Blasts: 5 %
Eosinophils Absolute: 0 10*3/uL (ref 0.0–0.5)
Eosinophils Relative: 3 %
HCT: 25.3 % — ABNORMAL LOW (ref 39.0–52.0)
Hemoglobin: 8.5 g/dL — ABNORMAL LOW (ref 13.0–17.0)
Lymphocytes Relative: 69 %
Lymphs Abs: 1.1 10*3/uL (ref 0.7–4.0)
MCH: 30.6 pg (ref 26.0–34.0)
MCHC: 33.6 g/dL (ref 30.0–36.0)
MCV: 91 fL (ref 80.0–100.0)
Monocytes Absolute: 0.1 10*3/uL (ref 0.1–1.0)
Monocytes Relative: 5 %
NEUTROS PCT: 18 %
Neutro Abs: 0.3 10*3/uL — CL (ref 1.7–17.7)
Platelet Count: 7 10*3/uL — CL (ref 150–400)
RBC: 2.78 MIL/uL — ABNORMAL LOW (ref 4.22–5.81)
RDW: 19.5 % — ABNORMAL HIGH (ref 11.5–15.5)
WBC Count: 1.6 10*3/uL — ABNORMAL LOW (ref 4.0–10.5)
nRBC: 0 % (ref 0.0–0.2)

## 2018-09-05 LAB — CMP (CANCER CENTER ONLY)
ALK PHOS: 142 U/L — AB (ref 38–126)
ALT: 12 U/L (ref 0–44)
ANION GAP: 9 (ref 5–15)
AST: 10 U/L — ABNORMAL LOW (ref 15–41)
Albumin: 3.2 g/dL — ABNORMAL LOW (ref 3.5–5.0)
BUN: 27 mg/dL — ABNORMAL HIGH (ref 8–23)
CO2: 25 mmol/L (ref 22–32)
Calcium: 9.5 mg/dL (ref 8.9–10.3)
Chloride: 104 mmol/L (ref 98–111)
Creatinine: 0.84 mg/dL (ref 0.61–1.24)
GFR, Est AFR Am: >60 mL/min (ref >60)
GFR, Estimated: >60 mL/min (ref >60)
Glucose, Bld: 128 mg/dL — ABNORMAL HIGH (ref 70–99)
Potassium: 4.2 mmol/L (ref 3.5–5.1)
Sodium: 138 mmol/L (ref 135–145)
Total Bilirubin: 0.3 mg/dL (ref 0.3–1.2)
Total Protein: 8.1 g/dL (ref 6.5–8.1)

## 2018-09-05 LAB — SAMPLE TO BLOOD BANK

## 2018-09-05 MED ORDER — SODIUM CHLORIDE 0.9% FLUSH
10.0000 mL | Freq: Once | INTRAVENOUS | Status: AC
  Administered 2018-09-05: 10 mL
  Filled 2018-09-05: qty 10

## 2018-09-05 MED ORDER — HEPARIN SOD (PORK) LOCK FLUSH 100 UNIT/ML IV SOLN
500.0000 [IU] | Freq: Once | INTRAVENOUS | Status: AC
  Administered 2018-09-05: 250 [IU]
  Filled 2018-09-05: qty 5

## 2018-09-05 NOTE — Telephone Encounter (Signed)
Received results Pltc = 7.  Results called to Dr. [name].

## 2018-09-05 NOTE — Progress Notes (Signed)
[place] OFFICE PROGRESS NOTE   Diagnosis: Myelodysplasia  INTERVAL HISTORY:   Andrew [name] returns for a scheduled visit.  No fever or bleeding.  The rectal breakdown is healing.  He underwent a bone marrow biopsy yesterday.  He was transfused with packed red blood cells on 08/28/2018.  He last received platelets on 07/09/2018.   Objective:  Vital signs in last 24 hours:  Blood pressure (!) 129/56, pulse 78, temperature 98.2 F (36.8 C), temperature source Oral, resp. rate 19, weight 154 lb 1.6 oz (69.9 kg), SpO2 100 %.    HEENT: Ecchymoses at the left greater than right buccal mucosa Resp: Lungs clear bilaterally Cardio: Regular rate and rhythm GI: No hepatosplenomegaly Vascular: No leg edema  Skin: 1/2 cm opening at the superior right perineum, no other areas of skin breakdown at the perineum ecchymosis at the right arm  Portacath/PICC-without erythema  Lab Results:  Lab Results  Component Value Date   WBC 1.6 (L) 09/05/2018   HGB 8.5 (L) 09/05/2018   HCT 25.3 (L) 09/05/2018   MCV 91.0 09/05/2018   PLT 7 (LL) 09/05/2018   NEUTROABS 0.3 (LL) 09/05/2018    CMP  Lab Results  Component Value Date   NA 138 09/05/2018   K 4.2 09/05/2018   CL 104 09/05/2018   CO2 25 09/05/2018   GLUCOSE 128 (H) 09/05/2018   BUN 27 (H) 09/05/2018   CREATININE 0.84 09/05/2018   CALCIUM 9.5 09/05/2018   PROT 8.1 09/05/2018   ALBUMIN 3.2 (L) 09/05/2018   AST 10 (L) 09/05/2018   ALT 12 09/05/2018   ALKPHOS 142 (H) 09/05/2018   BILITOT 0.3 09/05/2018   GFRNONAA >60 09/05/2018   GFRAA >60 09/05/2018    Imaging:  Ct Biopsy  Result Date: 09/04/2018 CLINICAL DATA:  History myelodysplasia with persistent pancytopenia. Bone marrow biopsy needed for assessment disease. EXAM: CT GUIDED BONE MARROW ASPIRATION AND BIOPSY ANESTHESIA/SEDATION: Versed 2.0 mg IV, Fentanyl 100 mcg IV Total Moderate Sedation Time:   12 minutes. The patient's level of consciousness and  physiologic status were continuously monitored during the procedure by Radiology nursing. PROCEDURE: The procedure risks, benefits, and alternatives were explained to the patient. Questions regarding the procedure were encouraged and answered. The patient understands and consents to the procedure. A time out was performed prior to initiating the procedure. The right gluteal region was prepped with chlorhexidine. Sterile gown and sterile gloves were used for the procedure. Local anesthesia was provided with 1% Lidocaine. Under CT guidance, an 11 gauge On Control bone cutting needle was advanced from a posterior approach into the right iliac bone. Needle positioning was confirmed with CT. Initial non heparinized and heparinized aspirate samples were obtained of bone marrow. Core biopsy was performed via the On Control drill needle. COMPLICATIONS: None FINDINGS: Inspection of initial aspirate did reveal visible particles. Intact core biopsy sample was obtained. IMPRESSION: CT guided bone marrow biopsy of right posterior iliac bone with both aspirate and core samples obtained. Electronically Signed   By: Aletta Edouard M.D.   On: 09/04/2018 10:46   Ct Bone Marrow Biopsy & Aspiration  Result Date: 09/04/2018 CLINICAL DATA:  History myelodysplasia with persistent pancytopenia. Bone marrow biopsy needed for assessment disease. EXAM: CT GUIDED BONE MARROW ASPIRATION AND BIOPSY ANESTHESIA/SEDATION: Versed 2.0 mg IV, Fentanyl 100 mcg IV Total Moderate Sedation Time:   12 minutes. The patient's level of consciousness and physiologic status were continuously monitored during the procedure by Radiology nursing. PROCEDURE: The procedure risks, benefits,  and alternatives were explained to the patient. Questions regarding the procedure were encouraged and answered. The patient understands and consents to the procedure. A time out was performed prior to initiating the procedure. The right gluteal region was prepped with  chlorhexidine. Sterile gown and sterile gloves were used for the procedure. Local anesthesia was provided with 1% Lidocaine. Under CT guidance, an 11 gauge On Control bone cutting needle was advanced from a posterior approach into the right iliac bone. Needle positioning was confirmed with CT. Initial non heparinized and heparinized aspirate samples were obtained of bone marrow. Core biopsy was performed via the On Control drill needle. COMPLICATIONS: None FINDINGS: Inspection of initial aspirate did reveal visible particles. Intact core biopsy sample was obtained. IMPRESSION: CT guided bone marrow biopsy of right posterior iliac bone with both aspirate and core samples obtained. Electronically Signed   By: Aletta Edouard M.D.   On: 09/04/2018 10:46    Medications: I have reviewed the patient's current medications.   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  Cycle 1 5-azacytidine 05/06/2018  Cycle25-azacytidine 06/16/2018  Bone marrow biopsy 07/09/2018-markedly hypocellular with decreased myeloid elements. No blast population. Felt to likely be related to a treatment effect.  Trial of G-CSF 07/14/2018-07/26/2018 2.Renal insufficiency 3.Diabetes 4.Hemoccult positive stool  Upper endoscopy 04/03/2018-chronic gastritis, duodenitis  Colonoscopy 04/04/2018-diverticulosis in the sigmoid colon 5.Right lower lobe 7 mm subpleural nodule on chest CT 04/02/2018 6.Admission with febrile neutropenia 05/20/2018- placed on cefepime, antibiotics changed to vancomycin/meropenem on 05/21/2018, anidulafungin added 9/1/2019antibiotics stopped 05/31/2018, discharge 06/03/2018 with prophylactic  acyclovir, Bactrim, and Diflucan. Bactrim discontinued and Levaquin started 06/05/2018 7. 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 Admission with fever and severe neutropenia 07/07/2018    Disposition: Andrew [name] has myelodysplasia.  He now has progressive pancytopenia and blasts are noted on the peripheral smear.  We will follow-up on the bone marrow biopsy result from yesterday and consider treatment options.  I will contact the leukemia service at [facility] when the bone marrow result is available. Andrew [name] will be scheduled for a platelet transfusion on 09/06/2018.  He will return for an office and lab visit on 09/08/2018.  25 minutes were spent with the patient today.  The majority of the time was used for counseling and coordination of care.  Betsy Coder, MD  09/05/2018  3:45 PM

## 2018-09-05 NOTE — Progress Notes (Signed)
Platelet count 7,000 today. Patient denies any bleeding except small amount of blood in tissue when he blows his nose.

## 2018-09-05 NOTE — Telephone Encounter (Signed)
Gave patient avs report and appointments for December  °

## 2018-09-06 ENCOUNTER — Inpatient Hospital Stay: Payer: Medicare Other

## 2018-09-06 DIAGNOSIS — D469 Myelodysplastic syndrome, unspecified: Secondary | ICD-10-CM | POA: Diagnosis not present

## 2018-09-06 DIAGNOSIS — D61818 Other pancytopenia: Secondary | ICD-10-CM | POA: Diagnosis not present

## 2018-09-06 DIAGNOSIS — Z452 Encounter for adjustment and management of vascular access device: Secondary | ICD-10-CM

## 2018-09-06 MED ORDER — HEPARIN SOD (PORK) LOCK FLUSH 100 UNIT/ML IV SOLN
250.0000 [IU] | INTRAVENOUS | Status: AC | PRN
  Administered 2018-09-06: 250 [IU]
  Filled 2018-09-06: qty 5

## 2018-09-06 MED ORDER — SODIUM CHLORIDE 0.9% IV SOLUTION
250.0000 mL | Freq: Once | INTRAVENOUS | Status: AC
  Administered 2018-09-06: 250 mL via INTRAVENOUS
  Filled 2018-09-06: qty 250

## 2018-09-06 MED ORDER — ACETAMINOPHEN 325 MG PO TABS
ORAL_TABLET | ORAL | Status: AC
  Filled 2018-09-06: qty 2

## 2018-09-06 MED ORDER — HEPARIN SOD (PORK) LOCK FLUSH 100 UNIT/ML IV SOLN
500.0000 [IU] | Freq: Every day | INTRAVENOUS | Status: DC | PRN
  Filled 2018-09-06: qty 5

## 2018-09-06 MED ORDER — DIPHENHYDRAMINE HCL 25 MG PO CAPS
ORAL_CAPSULE | ORAL | Status: AC
  Filled 2018-09-06: qty 1

## 2018-09-06 MED ORDER — ACETAMINOPHEN 325 MG PO TABS
650.0000 mg | ORAL_TABLET | Freq: Once | ORAL | Status: AC
  Administered 2018-09-06: 650 mg via ORAL

## 2018-09-06 MED ORDER — DIPHENHYDRAMINE HCL 25 MG PO CAPS
25.0000 mg | ORAL_CAPSULE | Freq: Once | ORAL | Status: AC
  Administered 2018-09-06: 25 mg via ORAL

## 2018-09-06 MED ORDER — SODIUM CHLORIDE 0.9% FLUSH
10.0000 mL | INTRAVENOUS | Status: AC | PRN
  Administered 2018-09-06: 10 mL
  Filled 2018-09-06: qty 10

## 2018-09-08 ENCOUNTER — Inpatient Hospital Stay: Payer: Medicare Other

## 2018-09-08 ENCOUNTER — Telehealth: Payer: Self-pay | Admitting: *Deleted

## 2018-09-08 ENCOUNTER — Inpatient Hospital Stay (HOSPITAL_BASED_OUTPATIENT_CLINIC_OR_DEPARTMENT_OTHER): Payer: Medicare Other | Admitting: Oncology

## 2018-09-08 VITALS — BP 109/50 | HR 66 | Temp 98.3°F | Ht 67.0 in | Wt 156.4 lb

## 2018-09-08 DIAGNOSIS — Z95828 Presence of other vascular implants and grafts: Secondary | ICD-10-CM

## 2018-09-08 DIAGNOSIS — N289 Disorder of kidney and ureter, unspecified: Secondary | ICD-10-CM

## 2018-09-08 DIAGNOSIS — D61818 Other pancytopenia: Secondary | ICD-10-CM | POA: Diagnosis not present

## 2018-09-08 DIAGNOSIS — D469 Myelodysplastic syndrome, unspecified: Secondary | ICD-10-CM | POA: Diagnosis not present

## 2018-09-08 DIAGNOSIS — Z79899 Other long term (current) drug therapy: Secondary | ICD-10-CM

## 2018-09-08 DIAGNOSIS — Z792 Long term (current) use of antibiotics: Secondary | ICD-10-CM

## 2018-09-08 DIAGNOSIS — Z452 Encounter for adjustment and management of vascular access device: Secondary | ICD-10-CM

## 2018-09-08 DIAGNOSIS — E119 Type 2 diabetes mellitus without complications: Secondary | ICD-10-CM

## 2018-09-08 LAB — PREPARE PLATELET PHERESIS: Unit division: 0

## 2018-09-08 LAB — CBC WITH DIFFERENTIAL (CANCER CENTER ONLY)
Band Neutrophils: 1 %
Basophils Absolute: 0 10*3/uL (ref 0.0–0.1)
Basophils Relative: 0 %
Blasts: 5 %
EOS ABS: 0 10*3/uL (ref 0.0–0.5)
Eosinophils Relative: 1 %
HCT: 21.4 % — ABNORMAL LOW (ref 39.0–52.0)
Hemoglobin: 7.2 g/dL — ABNORMAL LOW (ref 13.0–17.0)
Lymphocytes Relative: 78 %
Lymphs Abs: 1.1 10*3/uL (ref 0.7–4.0)
MCH: 30.9 pg (ref 26.0–34.0)
MCHC: 33.6 g/dL (ref 30.0–36.0)
MCV: 91.8 fL (ref 80.0–100.0)
Monocytes Absolute: 0 10*3/uL — ABNORMAL LOW (ref 0.1–1.0)
Monocytes Relative: 3 %
NEUTROS PCT: 10 %
Neutro Abs: 0.2 10*3/uL — CL (ref 1.7–7.7)
Other: 2 %
Platelet Count: 29 10*3/uL — ABNORMAL LOW (ref 150–400)
RBC: 2.33 MIL/uL — AB (ref 4.22–5.81)
RDW: 19.2 % — ABNORMAL HIGH (ref 11.5–15.5)
WBC Count: 1.4 10*3/uL — ABNORMAL LOW (ref 4.0–10.5)
nRBC: 0 % (ref 0.0–0.2)
nRBC: 0 /100 WBC

## 2018-09-08 LAB — BPAM PLATELET PHERESIS
Blood Product Expiration Date: 201912162359
ISSUE DATE / TIME: 201912141114
Unit Type and Rh: 5100

## 2018-09-08 LAB — SAMPLE TO BLOOD BANK

## 2018-09-08 MED ORDER — SODIUM CHLORIDE 0.9% FLUSH
10.0000 mL | Freq: Once | INTRAVENOUS | Status: AC
  Administered 2018-09-08: 10 mL
  Filled 2018-09-08: qty 10

## 2018-09-08 MED ORDER — LEVOFLOXACIN 500 MG PO TABS: 500.0000 mg | ORAL_TABLET | Freq: Every day | ORAL | 0 refills | Status: AC

## 2018-09-08 MED ORDER — HEPARIN SOD (PORK) LOCK FLUSH 100 UNIT/ML IV SOLN
500.0000 [IU] | Freq: Once | INTRAVENOUS | Status: AC
  Administered 2018-09-08: 500 [IU]
  Filled 2018-09-08: qty 5

## 2018-09-08 NOTE — Telephone Encounter (Signed)
Daughter, [name] called requesting to speak with Dr. [name] about her father. Call [phone].

## 2018-09-08 NOTE — Progress Notes (Signed)
@   1455: Notified patient that Dr. [name] wants him to begin Levaquin 500 mg daily x 3 weeks. He will pick up script today and start. Also made him aware he will need to have labs Wed/Friday this week and return on Monday to see Dr. [name]. Dr. Abel Presto staff at [facility] will be calling him for appointment there. Also notified him that Dr. [name] did speak with his daughter today. He was reminded to call for fever or s/s of infection. Notified scheduler to see late entry LOS>

## 2018-09-08 NOTE — Patient Instructions (Signed)
Implanted Port Home Guide An implanted port is a type of central line that is placed under the skin. Central lines are used to provide IV access when treatment or nutrition needs to be given through a person's veins. Implanted ports are used for long-term IV access. An implanted port may be placed because:  You need IV medicine that would be irritating to the small veins in your hands or arms.  You need long-term IV medicines, such as antibiotics.  You need IV nutrition for a long period.  You need frequent blood draws for lab tests.  You need dialysis.  Implanted ports are usually placed in the chest area, but they can also be placed in the upper arm, the abdomen, or the leg. An implanted port has two main parts:  Reservoir. The reservoir is round and will appear as a small, raised area under your skin. The reservoir is the part where a needle is inserted to give medicines or draw blood.  Catheter. The catheter is a thin, flexible tube that extends from the reservoir. The catheter is placed into a large vein. Medicine that is inserted into the reservoir goes into the catheter and then into the vein.  How will I care for my incision site? Do not get the incision site wet. Bathe or shower as directed by your health care provider. How is my port accessed? Special steps must be taken to access the port:  Before the port is accessed, a numbing cream can be placed on the skin. This helps numb the skin over the port site.  Your health care provider uses a sterile technique to access the port. ? Your health care provider must put on a mask and sterile gloves. ? The skin over your port is cleaned carefully with an antiseptic and allowed to dry. ? The port is gently pinched between sterile gloves, and a needle is inserted into the port.  Only "non-coring" port needles should be used to access the port. Once the port is accessed, a blood return should be checked. This helps ensure that the port  is in the vein and is not clogged.  If your port needs to remain accessed for a constant infusion, a clear (transparent) bandage will be placed over the needle site. The bandage and needle will need to be changed every week, or as directed by your health care provider.  Keep the bandage covering the needle clean and dry. Do not get it wet. Follow your health care provider's instructions on how to take a shower or bath while the port is accessed.  If your port does not need to stay accessed, no bandage is needed over the port.  What is flushing? Flushing helps keep the port from getting clogged. Follow your health care provider's instructions on how and when to flush the port. Ports are usually flushed with saline solution or a medicine called heparin. The need for flushing will depend on how the port is used.  If the port is used for intermittent medicines or blood draws, the port will need to be flushed: ? After medicines have been given. ? After blood has been drawn. ? As part of routine maintenance.  If a constant infusion is running, the port may not need to be flushed.  How long will my port stay implanted? The port can stay in for as long as your health care provider thinks it is needed. When it is time for the port to come out, surgery will be   done to remove it. The procedure is similar to the one performed when the port was put in. When should I seek immediate medical care? When you have an implanted port, you should seek immediate medical care if:  You notice a bad smell coming from the incision site.  You have swelling, redness, or drainage at the incision site.  You have more swelling or pain at the port site or the surrounding area.  You have a fever that is not controlled with medicine.  This information is not intended to replace advice given to you by your health care provider. Make sure you discuss any questions you have with your health care provider. Document  Released: 09/10/2005 Document Revised: 02/16/2016 Document Reviewed: 05/18/2013 Elsevier Interactive Patient Education  2017 Elsevier Inc.  

## 2018-09-08 NOTE — Progress Notes (Signed)
[place] OFFICE PROGRESS NOTE   Diagnosis: Myelodysplasia  INTERVAL HISTORY:   Andrew [name] returns as scheduled.  He was transfused with platelets on 09/06/2018.  He reports tolerating the platelet transfusion well.  No bleeding or fever.  No complaint.  Objective:  Vital signs in last 24 hours:  Blood pressure (!) 109/50, pulse 66, temperature 98.3 F (36.8 C), temperature source Oral, height _0  (1.702 m), weight 156 lb 6.4 oz (70.9 kg), SpO2 100 %.    HEENT: White coat over the tongue, no buccal thrush, no bleeding Resp: Lungs clear bilaterally Cardio: Regular rate and rhythm GI: Nontender, no hepatosplenomegaly Vascular: No leg edema  Skin: Iliac bone marrow site without bleeding  Portacath/PICC-without erythema  Lab Results:  Lab Results  Component Value Date   WBC 1.4 (L) 09/08/2018   HGB 7.2 (L) 09/08/2018   HCT 21.4 (L) 09/08/2018   MCV 91.8 09/08/2018   PLT 29 (L) 09/08/2018   NEUTROABS 0.2 (LL) 09/08/2018   Smear: The platelets are decreased in number of variation in red cell size, few teardrops, the polychromasia is not increased.  Rare mature neutrophil, blast cells are present CMP  Lab Results  Component Value Date   NA 138 09/05/2018   K 4.2 09/05/2018   CL 104 09/05/2018   CO2 25 09/05/2018   GLUCOSE 128 (H) 09/05/2018   BUN 27 (H) 09/05/2018   CREATININE 0.84 09/05/2018   CALCIUM 9.5 09/05/2018   PROT 8.1 09/05/2018   ALBUMIN 3.2 (L) 09/05/2018   AST 10 (L) 09/05/2018   ALT 12 09/05/2018   ALKPHOS 142 (H) 09/05/2018   BILITOT 0.3 09/05/2018   GFRNONAA >60 09/05/2018   GFRAA >60 09/05/2018     Medications: I have reviewed the patient's current medications.   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  Cycle 1 5-azacytidine 05/06/2018  Cycle25-azacytidine 06/16/2018  Bone marrow biopsy 07/09/2018-markedly hypocellular with decreased myeloid elements. No blast population. Felt to likely be related to a treatment effect.  Trial of G-CSF 07/14/2018-07/26/2018  Bone marrow biopsy 09/04/2018- limited aspirate and clot material, foci of myeloblast on the biopsy specimen 2.Renal insufficiency 3.Diabetes 4.Hemoccult positive stool  Upper endoscopy 04/03/2018-chronic gastritis, duodenitis  Colonoscopy 04/04/2018-diverticulosis in the sigmoid colon 5.Right lower lobe 7 mm subpleural nodule on chest CT 04/02/2018 6.Admission with febrile neutropenia 05/20/2018- placed on cefepime, antibiotics changed to vancomycin/meropenem on 05/21/2018, anidulafungin added 9/1/2019antibiotics stopped 05/31/2018, discharge 06/03/2018 with prophylactic acyclovir, Bactrim, and Diflucan. Bactrim discontinued and Levaquin started 06/05/2018 7. 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 Admission with fever and severe neutropenia 07/07/2018      Disposition: Andrew [name] appears unchanged.  He has persistent severe pancytopenia.  He responded to a platelet transfusion 09/06/2018.  I discussed the 09/04/2018 bone marrow biopsy with Dr. Gari Crown.  He indicates the bone marrow remains hypocellular with fibrosis.  In the few areas of intact cellularity there are increased numbers of blast cells.  I discussed the bone marrow  findings with Andrew [name].  I contacted Dr. Florene Glen on the leukemia service  at [facility].  They will see Andrew [name] to discuss treatment options which may include 5 azacytidine/venetoclax or induction chemotherapy.  We will continue platelet transfusion support as needed.  He will begin antibiotic prophylaxis in the setting of severe neutropenia.  25 minutes were spent with the patient today.  The majority of the time was used for counseling and coordination of care.  Betsy Coder, MD  09/08/2018  1:59 PM

## 2018-09-08 NOTE — Addendum Note (Signed)
Addended by: Tania Ade on: 09/08/2018 02:59 PM   Modules accepted: Orders

## 2018-09-09 ENCOUNTER — Telehealth: Payer: Self-pay | Admitting: Oncology

## 2018-09-09 ENCOUNTER — Telehealth: Payer: Self-pay | Admitting: *Deleted

## 2018-09-09 NOTE — Telephone Encounter (Signed)
"  [place], [phone]) I'm not with my dad, Andrew [name] but he's not himself.  He's like this when he has a fever.  He lives alone.  Has thermometer in his car."    [name] says she will see him soon.  Asked for return call with temperature/, symptoms, signs or changes.  Advised to go to the ED for fever greater than 100.5 .  Transferred with request for provider's nurse.

## 2018-09-09 NOTE — Telephone Encounter (Signed)
Scheduled appt per 12/16 los - pt is aware of appts - waiting on msg from Dr. [name] for f/u date

## 2018-09-09 NOTE — Telephone Encounter (Signed)
Daughter had called triage concerned her father had fever and was not acting himself when she called him. Spoke with patient after daughter had gotten to his home and he is acting normally. Patient reports he had dropped the phone when she called and she became concerned. He reports temp today 100.0 and he just took Tylenol. He has no cough, dyspnea or s/s of infection. Started his antibiotics last night.  Daughter concerned that he had fever last two days of 102 and patient did not call office. Per Dr. [name]: seems OK at present. Call on call line for fever 101 or shaking chills. Follow up tomorrow as scheduled for labs. They report Dr. Florene Glen has not called him yet.

## 2018-09-10 ENCOUNTER — Inpatient Hospital Stay: Payer: Medicare Other

## 2018-09-10 ENCOUNTER — Telehealth: Payer: Self-pay | Admitting: *Deleted

## 2018-09-10 VITALS — BP 119/62 | HR 73 | Temp 98.7°F | Resp 18

## 2018-09-10 DIAGNOSIS — D61818 Other pancytopenia: Secondary | ICD-10-CM

## 2018-09-10 DIAGNOSIS — D469 Myelodysplastic syndrome, unspecified: Secondary | ICD-10-CM

## 2018-09-10 DIAGNOSIS — Z452 Encounter for adjustment and management of vascular access device: Secondary | ICD-10-CM

## 2018-09-10 LAB — CBC WITH DIFFERENTIAL (CANCER CENTER ONLY)
Abs Immature Granulocytes: 0 10*3/uL (ref 0.00–0.07)
BASOS ABS: 0 10*3/uL (ref 0.0–0.1)
Band Neutrophils: 1 %
Basophils Relative: 1 %
Blasts: 5 %
EOS PCT: 1 %
Eosinophils Absolute: 0 10*3/uL (ref 0.0–0.5)
HCT: 19.2 % — ABNORMAL LOW (ref 39.0–52.0)
Hemoglobin: 6.6 g/dL — CL (ref 13.0–17.0)
Lymphocytes Relative: 70 %
Lymphs Abs: 0.7 10*3/uL (ref 0.7–4.0)
MCH: 31.1 pg (ref 26.0–34.0)
MCHC: 34.4 g/dL (ref 30.0–36.0)
MCV: 90.6 fL (ref 80.0–100.0)
Monocytes Absolute: 0.1 10*3/uL (ref 0.1–1.0)
Monocytes Relative: 6 %
Neutro Abs: 0.2 10*3/uL — CL (ref 1.7–17.7)
Neutrophils Relative %: 15 %
Other: 1 %
Platelet Count: 18 10*3/uL — ABNORMAL LOW (ref 150–400)
RBC: 2.12 MIL/uL — ABNORMAL LOW (ref 4.22–5.81)
RDW: 19 % — AB (ref 11.5–15.5)
WBC Count: 1 10*3/uL — ABNORMAL LOW (ref 4.0–10.5)
nRBC: 0 % (ref 0.0–0.2)

## 2018-09-10 LAB — SAMPLE TO BLOOD BANK

## 2018-09-10 LAB — PREPARE RBC (CROSSMATCH)

## 2018-09-10 MED ORDER — HEPARIN SOD (PORK) LOCK FLUSH 100 UNIT/ML IV SOLN
500.0000 [IU] | Freq: Once | INTRAVENOUS | Status: AC
  Administered 2018-09-10: 250 [IU]
  Filled 2018-09-10: qty 5

## 2018-09-10 MED ORDER — SODIUM CHLORIDE 0.9% IV SOLUTION
250.0000 mL | Freq: Once | INTRAVENOUS | Status: AC
  Administered 2018-09-10: 250 mL via INTRAVENOUS
  Filled 2018-09-10: qty 250

## 2018-09-10 MED ORDER — SODIUM CHLORIDE 0.9% FLUSH
10.0000 mL | INTRAVENOUS | Status: AC | PRN
  Administered 2018-09-10: 10 mL
  Filled 2018-09-10: qty 10

## 2018-09-10 MED ORDER — ACETAMINOPHEN 325 MG PO TABS
650.0000 mg | ORAL_TABLET | Freq: Once | ORAL | Status: AC
  Administered 2018-09-10: 650 mg via ORAL

## 2018-09-10 MED ORDER — HEPARIN SOD (PORK) LOCK FLUSH 100 UNIT/ML IV SOLN
250.0000 [IU] | INTRAVENOUS | Status: AC | PRN
  Administered 2018-09-10: 250 [IU]
  Filled 2018-09-10: qty 5

## 2018-09-10 MED ORDER — DIPHENHYDRAMINE HCL 25 MG PO CAPS
25.0000 mg | ORAL_CAPSULE | Freq: Once | ORAL | Status: AC
  Administered 2018-09-10: 25 mg via ORAL

## 2018-09-10 MED ORDER — SODIUM CHLORIDE 0.9% FLUSH
10.0000 mL | Freq: Once | INTRAVENOUS | Status: AC
  Administered 2018-09-10: 10 mL
  Filled 2018-09-10: qty 10

## 2018-09-10 MED ORDER — ACETAMINOPHEN 325 MG PO TABS
ORAL_TABLET | ORAL | Status: AC
  Filled 2018-09-10: qty 2

## 2018-09-10 MED ORDER — DIPHENHYDRAMINE HCL 25 MG PO CAPS
ORAL_CAPSULE | ORAL | Status: AC
  Filled 2018-09-10: qty 1

## 2018-09-10 NOTE — Telephone Encounter (Signed)
Abnormal lab call report received at 12:21 pm.  Absolute neutrophil = 0.2.  Patient with [facility] at this time.  Message left to nurse ext. [phone].

## 2018-09-10 NOTE — Progress Notes (Signed)
Informed patient of his appointment on 21/31/19 at 1:15/1:30 with Dr. Jerrye Noble at [facility] for AML. Printed and provided him a copy of patient information on AML from [place] site.

## 2018-09-10 NOTE — Patient Instructions (Signed)
Blood Transfusion, Adult, Care After This sheet gives you information about how to care for yourself after your procedure. Your doctor may also give you more specific instructions. If you have problems or questions, contact your doctor. Follow these instructions at home:   Take over-the-counter and prescription medicines only as told by your doctor.  Go back to your normal activities as told by your doctor.  Follow instructions from your doctor about how to take care of the area where an IV tube was put into your vein (insertion site). Make sure you: ? Wash your hands with soap and water before you change your bandage (dressing). If there is no soap and water, use hand sanitizer. ? Change your bandage as told by your doctor.  Check your IV insertion site every day for signs of infection. Check for: ? More redness, swelling, or pain. ? More fluid or blood. ? Warmth. ? Pus or a bad smell. Contact a doctor if:  You have more redness, swelling, or pain around the IV insertion site.  You have more fluid or blood coming from the IV insertion site.  Your IV insertion site feels warm to the touch.  You have pus or a bad smell coming from the IV insertion site.  Your pee (urine) turns pink, red, or brown.  You feel weak after doing your normal activities. Get help right away if:  You have signs of a serious allergic or body defense (immune) system reaction, including: ? Itchiness. ? Hives. ? Trouble breathing. ? Anxiety. ? Pain in your chest or lower back. ? Fever, flushing, and chills. ? Fast pulse. ? Rash. ? Watery poop (diarrhea). ? Throwing up (vomiting). ? Dark pee. ? Serious headache. ? Dizziness. ? Stiff neck. ? Yellow color in your face or the white parts of your eyes (jaundice). Summary  After a blood transfusion, return to your normal activities as told by your doctor.  Every day, check for signs of infection where the IV tube was put into your vein.  Some  signs of infection are warm skin, more redness and pain, more fluid or blood, and pus or a bad smell where the needle went in.  Contact your doctor if you feel weak or have any unusual symptoms. This information is not intended to replace advice given to you by your health care provider. Make sure you discuss any questions you have with your health care provider. Document Released: 10/01/2014 Document Revised: 05/04/2016 Document Reviewed: 05/04/2016 Elsevier Interactive Patient Education  2019 Elsevier Inc.  

## 2018-09-11 ENCOUNTER — Encounter: Payer: Self-pay | Admitting: *Deleted

## 2018-09-11 DIAGNOSIS — D61818 Other pancytopenia: Secondary | ICD-10-CM

## 2018-09-11 LAB — BPAM RBC
Blood Product Expiration Date: 202001112359
Blood Product Expiration Date: 202001132359
ISSUE DATE / TIME: 201912181241
ISSUE DATE / TIME: 201912181241
Unit Type and Rh: 5100
Unit Type and Rh: 5100

## 2018-09-11 LAB — TYPE AND SCREEN
ABO/RH(D): O POS
Antibody Screen: NEGATIVE
Unit division: 0
Unit division: 0

## 2018-09-11 NOTE — Progress Notes (Signed)
Per Dr. [name] request: Called [facility] blood bank and requested to have platelets available for 12/20 and 12/23. Was informed they will need another sample tube of blood to send to [place] to match him. This will be required for all platelet orders. If drawn on Friday, may not be able to transfuse until 12/21. MD made aware. Placed order in Epic for 2nd blood bank sample for 12/20 and also noted on flush nurse appointment schedule.

## 2018-09-12 ENCOUNTER — Inpatient Hospital Stay: Payer: Medicare Other

## 2018-09-12 ENCOUNTER — Other Ambulatory Visit: Payer: Self-pay | Admitting: *Deleted

## 2018-09-12 ENCOUNTER — Other Ambulatory Visit: Payer: Self-pay

## 2018-09-12 ENCOUNTER — Telehealth: Payer: Self-pay | Admitting: *Deleted

## 2018-09-12 DIAGNOSIS — D61818 Other pancytopenia: Secondary | ICD-10-CM

## 2018-09-12 DIAGNOSIS — D469 Myelodysplastic syndrome, unspecified: Secondary | ICD-10-CM

## 2018-09-12 DIAGNOSIS — Z452 Encounter for adjustment and management of vascular access device: Secondary | ICD-10-CM

## 2018-09-12 LAB — CBC WITH DIFFERENTIAL (CANCER CENTER ONLY)
Abs Immature Granulocytes: 0 10*3/uL (ref 0.00–0.07)
BAND NEUTROPHILS: 2 %
Basophils Absolute: 0 10*3/uL (ref 0.0–0.1)
Basophils Relative: 0 %
Blasts: 10 %
Eosinophils Absolute: 0 10*3/uL (ref 0.0–0.5)
Eosinophils Relative: 0 %
HCT: 27.9 % — ABNORMAL LOW (ref 39.0–52.0)
Hemoglobin: 9.5 g/dL — ABNORMAL LOW (ref 13.0–17.0)
Lymphocytes Relative: 73 %
Lymphs Abs: 0.9 10*3/uL (ref 0.7–4.0)
MCH: 30.1 pg (ref 26.0–34.0)
MCHC: 34.1 g/dL (ref 30.0–36.0)
MCV: 88.3 fL (ref 80.0–100.0)
Monocytes Absolute: 0.1 10*3/uL (ref 0.1–1.0)
Monocytes Relative: 9 %
Neutro Abs: 0.1 10*3/uL — CL (ref 1.7–17.7)
Neutrophils Relative %: 6 %
PLATELETS: 10 10*3/uL — AB (ref 150–400)
RBC: 3.16 MIL/uL — ABNORMAL LOW (ref 4.22–5.81)
RDW: 17.3 % — ABNORMAL HIGH (ref 11.5–15.5)
WBC: 1.3 10*3/uL — AB (ref 4.0–10.5)
nRBC: 0 % (ref 0.0–0.2)

## 2018-09-12 LAB — TYPE AND SCREEN
ABO/RH(D): O POS
Antibody Screen: NEGATIVE

## 2018-09-12 MED ORDER — MEPERIDINE HCL 25 MG/ML IJ SOLN: 12.5000 mg | Freq: Once | INTRAMUSCULAR | Status: DC

## 2018-09-12 MED ORDER — HEPARIN SOD (PORK) LOCK FLUSH 100 UNIT/ML IV SOLN
500.0000 [IU] | Freq: Once | INTRAVENOUS | Status: AC
  Administered 2018-09-12: 500 [IU]
  Filled 2018-09-12: qty 5

## 2018-09-12 MED ORDER — SODIUM CHLORIDE 0.9% FLUSH
10.0000 mL | Freq: Once | INTRAVENOUS | Status: AC
  Administered 2018-09-12: 10 mL
  Filled 2018-09-12: qty 10

## 2018-09-12 MED ORDER — MEPERIDINE HCL 25 MG/ML IJ SOLN: 12.5000 mg | Freq: Once | INTRAMUSCULAR | Status: DC | PRN

## 2018-09-12 NOTE — Telephone Encounter (Signed)
Called patient and explained his blood counts to him and need for platelet transfusion tomorrow. He understands and agrees. Confirmed he is still taking his Levaquin and will call for fever or s/s of infection.

## 2018-09-12 NOTE — Patient Instructions (Signed)

## 2018-09-13 ENCOUNTER — Inpatient Hospital Stay: Payer: Medicare Other

## 2018-09-13 DIAGNOSIS — D469 Myelodysplastic syndrome, unspecified: Secondary | ICD-10-CM | POA: Diagnosis not present

## 2018-09-13 DIAGNOSIS — D61818 Other pancytopenia: Secondary | ICD-10-CM

## 2018-09-13 MED ORDER — SODIUM CHLORIDE 0.9% FLUSH
10.0000 mL | INTRAVENOUS | Status: AC | PRN
  Administered 2018-09-13: 10 mL
  Filled 2018-09-13: qty 10

## 2018-09-13 MED ORDER — DIPHENHYDRAMINE HCL 25 MG PO CAPS
25.0000 mg | ORAL_CAPSULE | Freq: Once | ORAL | Status: AC
  Administered 2018-09-13: 25 mg via ORAL

## 2018-09-13 MED ORDER — DIPHENHYDRAMINE HCL 25 MG PO CAPS
ORAL_CAPSULE | ORAL | Status: AC
  Filled 2018-09-13: qty 1

## 2018-09-13 MED ORDER — SODIUM CHLORIDE 0.9% IV SOLUTION
250.0000 mL | Freq: Once | INTRAVENOUS | Status: AC
  Administered 2018-09-13: 250 mL via INTRAVENOUS
  Filled 2018-09-13: qty 250

## 2018-09-13 MED ORDER — HEPARIN SOD (PORK) LOCK FLUSH 100 UNIT/ML IV SOLN
250.0000 [IU] | INTRAVENOUS | Status: AC | PRN
  Administered 2018-09-13: 250 [IU]
  Filled 2018-09-13: qty 5

## 2018-09-13 MED ORDER — ACETAMINOPHEN 325 MG PO TABS
ORAL_TABLET | ORAL | Status: AC
  Filled 2018-09-13: qty 2

## 2018-09-13 MED ORDER — ACETAMINOPHEN 325 MG PO TABS
650.0000 mg | ORAL_TABLET | Freq: Once | ORAL | Status: AC
  Administered 2018-09-13: 650 mg via ORAL

## 2018-09-13 NOTE — Patient Instructions (Signed)

## 2018-09-14 LAB — PREPARE PLATELET PHERESIS: Unit division: 0

## 2018-09-14 LAB — BPAM PLATELET PHERESIS
BLOOD PRODUCT EXPIRATION DATE: 201912222359
ISSUE DATE / TIME: 201912210907
Unit Type and Rh: 5100

## 2018-09-15 ENCOUNTER — Inpatient Hospital Stay (HOSPITAL_BASED_OUTPATIENT_CLINIC_OR_DEPARTMENT_OTHER): Payer: Medicare Other | Admitting: Oncology

## 2018-09-15 ENCOUNTER — Telehealth: Payer: Self-pay | Admitting: Oncology

## 2018-09-15 ENCOUNTER — Encounter: Payer: Self-pay | Admitting: Oncology

## 2018-09-15 ENCOUNTER — Inpatient Hospital Stay: Payer: Medicare Other

## 2018-09-15 ENCOUNTER — Encounter (HOSPITAL_COMMUNITY): Payer: Self-pay | Admitting: Oncology

## 2018-09-15 ENCOUNTER — Telehealth: Payer: Self-pay | Admitting: *Deleted

## 2018-09-15 VITALS — BP 123/59 | HR 99 | Temp 98.6°F | Resp 18 | Ht 67.0 in | Wt 156.8 lb

## 2018-09-15 DIAGNOSIS — Z452 Encounter for adjustment and management of vascular access device: Secondary | ICD-10-CM

## 2018-09-15 DIAGNOSIS — N289 Disorder of kidney and ureter, unspecified: Secondary | ICD-10-CM

## 2018-09-15 DIAGNOSIS — D469 Myelodysplastic syndrome, unspecified: Secondary | ICD-10-CM | POA: Diagnosis not present

## 2018-09-15 DIAGNOSIS — D61818 Other pancytopenia: Secondary | ICD-10-CM | POA: Diagnosis not present

## 2018-09-15 DIAGNOSIS — Z792 Long term (current) use of antibiotics: Secondary | ICD-10-CM

## 2018-09-15 DIAGNOSIS — K056 Periodontal disease, unspecified: Secondary | ICD-10-CM

## 2018-09-15 DIAGNOSIS — Z95828 Presence of other vascular implants and grafts: Secondary | ICD-10-CM

## 2018-09-15 DIAGNOSIS — E119 Type 2 diabetes mellitus without complications: Secondary | ICD-10-CM

## 2018-09-15 DIAGNOSIS — Z79899 Other long term (current) drug therapy: Secondary | ICD-10-CM

## 2018-09-15 LAB — CBC WITH DIFFERENTIAL (CANCER CENTER ONLY)
Abs Immature Granulocytes: 0 10*3/uL (ref 0.00–0.07)
Band Neutrophils: 1 %
Basophils Absolute: 0 10*3/uL (ref 0.0–0.1)
Basophils Relative: 0 %
Blasts: 20 %
EOS PCT: 0 %
Eosinophils Absolute: 0 10*3/uL (ref 0.0–0.5)
HEMATOCRIT: 25.2 % — AB (ref 39.0–52.0)
HEMOGLOBIN: 8.5 g/dL — AB (ref 13.0–17.0)
Lymphocytes Relative: 72 %
Lymphs Abs: 0.8 10*3/uL (ref 0.7–4.0)
MCH: 30 pg (ref 26.0–34.0)
MCHC: 33.7 g/dL (ref 30.0–36.0)
MCV: 89 fL (ref 80.0–100.0)
MONO ABS: 0 10*3/uL — AB (ref 0.1–1.0)
Monocytes Relative: 1 %
Myelocytes: 1 %
Neutro Abs: 0.1 10*3/uL — CL (ref 1.7–17.7)
Neutrophils Relative %: 4 %
Other: 1 %
Platelet Count: 34 10*3/uL — ABNORMAL LOW (ref 150–400)
RBC: 2.83 MIL/uL — ABNORMAL LOW (ref 4.22–5.81)
RDW: 17 % — ABNORMAL HIGH (ref 11.5–15.5)
WBC Count: 1.1 10*3/uL — ABNORMAL LOW (ref 4.0–10.5)
nRBC: 0 % (ref 0.0–0.2)

## 2018-09-15 LAB — SAMPLE TO BLOOD BANK

## 2018-09-15 MED ORDER — FLUCONAZOLE 50 MG PO TABS: 50.0000 mg | ORAL_TABLET | Freq: Every day | ORAL | 1 refills | Status: AC

## 2018-09-15 MED ORDER — HEPARIN SOD (PORK) LOCK FLUSH 100 UNIT/ML IV SOLN
500.0000 [IU] | Freq: Once | INTRAVENOUS | Status: AC
  Administered 2018-09-15: 500 [IU]
  Filled 2018-09-15: qty 5

## 2018-09-15 MED ORDER — SODIUM CHLORIDE 0.9% FLUSH
10.0000 mL | Freq: Once | INTRAVENOUS | Status: AC
  Administered 2018-09-15: 10 mL
  Filled 2018-09-15: qty 10

## 2018-09-15 NOTE — Telephone Encounter (Signed)
"  Rosann Auerbach with critical [place] = 0.1 for Delton Coombes."  Results provided to collaborative nurse; reports provider seeing patient now."

## 2018-09-15 NOTE — Progress Notes (Signed)
[facility] OFFICE PROGRESS NOTE   Diagnosis: AML  INTERVAL HISTORY:   Andrew [name] returns as scheduled.  He was transfused platelets on 09/13/2018.  He reports no fever or chills with the transfusion.  No bleeding.  No complaint.  He is taking Levaquin prophylaxis.  He is scheduled to see Dr. Florene Glen 09/23/2018.  Objective:  Vital signs in last 24 hours:  Blood pressure (!) 123/59, pulse 99, temperature 98.6 F (37 C), temperature source Oral, resp. rate 18, height '5\' 7"'$  (1.702 m), weight 156 lb 12.8 oz (71.1 kg), SpO2 100 %.    HEENT: White coat over the tongue, no buccal thrush, no bleeding Resp: Lungs with scattered mild inspiratory and expiratory rhonchi, no respiratory distress Cardio: Regular rhythm with premature beats GI: No hepatosplenomegaly Vascular: No leg edema  Skin: Bone marrow site without bleeding or evidence of infection  Portacath/PICC-without erythema  Lab Results:  Lab Results  Component Value Date   WBC 1.1 (L) 09/15/2018   HGB 8.5 (L) 09/15/2018   HCT 25.2 (L) 09/15/2018   MCV 89.0 09/15/2018   PLT 34 (L) 09/15/2018   NEUTROABS 0.1 (LL) 09/15/2018    CMP  Lab Results  Component Value Date   NA 138 09/05/2018   K 4.2 09/05/2018   CL 104 09/05/2018   CO2 25 09/05/2018   GLUCOSE 128 (H) 09/05/2018   BUN 27 (H) 09/05/2018   CREATININE 0.84 09/05/2018   CALCIUM 9.5 09/05/2018   PROT 8.1 09/05/2018   ALBUMIN 3.2 (L) 09/05/2018   AST 10 (L) 09/05/2018   ALT 12 09/05/2018   ALKPHOS 142 (H) 09/05/2018   BILITOT 0.3 09/05/2018   GFRNONAA >60 09/05/2018   GFRAA >60 09/05/2018    Medications: I have reviewed the patient's current medications.   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  Cycle 1 5-azacytidine 05/06/2018  Cycle25-azacytidine 06/16/2018  Bone marrow biopsy 07/09/2018-markedly hypocellular with decreased myeloid elements. No blast population. Felt to likely be related to a treatment effect.  Trial of G-CSF 07/14/2018-07/26/2018  Bone marrow biopsy 09/04/2018- limited aspirate and clot material, foci of myeloblast on the biopsy specimen, cytogenetics: 3p-, 5q-, -7, +21, FISH:+8, 7q-, 5q- 2.Renal insufficiency 3.Diabetes 4.Hemoccult positive stool  Upper endoscopy 04/03/2018-chronic gastritis, duodenitis  Colonoscopy 04/04/2018-diverticulosis in the sigmoid colon 5.Right lower lobe 7 mm subpleural nodule on chest CT 04/02/2018 6.Admission with febrile neutropenia 05/20/2018- placed on cefepime, antibiotics changed to vancomycin/meropenem on 05/21/2018, anidulafungin added 9/1/2019antibiotics stopped 05/31/2018, discharge 06/03/2018 with prophylactic acyclovir, Bactrim, and Diflucan. Bactrim discontinued and Levaquin started 06/05/2018 7. 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 Admission with fever and severe neutropenia 07/07/2018   Disposition: [name] appears unchanged.  He has persistent severe pancytopenia.  The recent bone marrow suggest progression to AML.  He responded to a platelet transfusion given 09/13/2018.  Mr. Rueb will continue Levaquin, acyclovir, and Diflucan prophylaxis.  He will call for bleeding or a fever. He will return for a lab visit and PICC care on  09/19/2018 and 09/22/2018.  He will be scheduled for an  office visit on 09/25/2018.  He is scheduled to see Dr. Florene Glen on 09/23/2018.  We will arrange for transfusion support with platelets and packed red blood cells as needed.  Betsy Coder, MD  09/15/2018  3:05 PM

## 2018-09-15 NOTE — Telephone Encounter (Signed)
Printed calendar and avs. °

## 2018-09-16 ENCOUNTER — Other Ambulatory Visit: Payer: Self-pay | Admitting: *Deleted

## 2018-09-16 DIAGNOSIS — D469 Myelodysplastic syndrome, unspecified: Secondary | ICD-10-CM

## 2018-09-16 DIAGNOSIS — D61818 Other pancytopenia: Secondary | ICD-10-CM

## 2018-09-16 NOTE — Progress Notes (Signed)
MD requests to have blood bank work on getting platelets for transfusion on 09/20/18. Blood bank notified and the extra tube needed to send to [facility] will be collected on 09/19/18.

## 2018-09-19 ENCOUNTER — Inpatient Hospital Stay: Payer: Medicare Other

## 2018-09-19 ENCOUNTER — Other Ambulatory Visit: Payer: Self-pay | Admitting: *Deleted

## 2018-09-19 DIAGNOSIS — D469 Myelodysplastic syndrome, unspecified: Secondary | ICD-10-CM

## 2018-09-19 DIAGNOSIS — D61818 Other pancytopenia: Secondary | ICD-10-CM

## 2018-09-19 DIAGNOSIS — Z452 Encounter for adjustment and management of vascular access device: Secondary | ICD-10-CM

## 2018-09-19 LAB — CBC WITH DIFFERENTIAL (CANCER CENTER ONLY)
Abs Immature Granulocytes: 0 10*3/uL (ref 0.00–0.07)
BASOS PCT: 0 %
Basophils Absolute: 0 10*3/uL (ref 0.0–0.1)
Blasts: 15 %
Eosinophils Absolute: 0 10*3/uL (ref 0.0–0.5)
Eosinophils Relative: 0 %
HCT: 23.4 % — ABNORMAL LOW (ref 39.0–52.0)
Hemoglobin: 7.6 g/dL — ABNORMAL LOW (ref 13.0–17.0)
Lymphocytes Relative: 84 %
Lymphs Abs: 1.1 10*3/uL (ref 0.7–4.0)
MCH: 29.5 pg (ref 26.0–34.0)
MCHC: 32.5 g/dL (ref 30.0–36.0)
MCV: 90.7 fL (ref 80.0–100.0)
Monocytes Absolute: 0 10*3/uL — ABNORMAL LOW (ref 0.1–1.0)
Monocytes Relative: 1 %
NRBC: 0 % (ref 0.0–0.2)
Neutro Abs: 0 10*3/uL — CL (ref 1.7–17.7)
Neutrophils Relative %: 0 %
Platelet Count: 10 10*3/uL — CL (ref 150–400)
RBC: 2.58 MIL/uL — ABNORMAL LOW (ref 4.22–5.81)
RDW: 16.9 % — ABNORMAL HIGH (ref 11.5–15.5)
WBC Count: 1.3 10*3/uL — ABNORMAL LOW (ref 4.0–10.5)

## 2018-09-19 LAB — SAMPLE TO BLOOD BANK

## 2018-09-19 LAB — PREPARE RBC (CROSSMATCH)

## 2018-09-19 MED ORDER — HEPARIN SOD (PORK) LOCK FLUSH 100 UNIT/ML IV SOLN
500.0000 [IU] | Freq: Once | INTRAVENOUS | Status: AC
  Administered 2018-09-19: 250 [IU]
  Filled 2018-09-19: qty 5

## 2018-09-19 MED ORDER — SODIUM CHLORIDE 0.9% FLUSH
10.0000 mL | Freq: Once | INTRAVENOUS | Status: AC
  Administered 2018-09-19: 10 mL
  Filled 2018-09-19: qty 10

## 2018-09-19 NOTE — Progress Notes (Signed)
Notified patient of his CBC results today. He is to call for fever or bleeding. Will have blood and platelets tomorrow at 0800. Blood bank is sending sample to [facility] to get his platelets for tomorrow. Orders entered in Epic with Demerol prn in sign and held.

## 2018-09-20 ENCOUNTER — Inpatient Hospital Stay: Payer: Medicare Other

## 2018-09-20 ENCOUNTER — Other Ambulatory Visit: Payer: Self-pay

## 2018-09-20 DIAGNOSIS — D469 Myelodysplastic syndrome, unspecified: Secondary | ICD-10-CM

## 2018-09-20 DIAGNOSIS — D61818 Other pancytopenia: Secondary | ICD-10-CM

## 2018-09-20 MED ORDER — SODIUM CHLORIDE 0.9% FLUSH
10.0000 mL | INTRAVENOUS | Status: AC | PRN
  Administered 2018-09-20: 10 mL
  Filled 2018-09-20: qty 10

## 2018-09-20 MED ORDER — HEPARIN SOD (PORK) LOCK FLUSH 100 UNIT/ML IV SOLN
250.0000 [IU] | INTRAVENOUS | Status: AC | PRN
  Administered 2018-09-20: 250 [IU]
  Filled 2018-09-20: qty 5

## 2018-09-20 MED ORDER — ACETAMINOPHEN 325 MG PO TABS
ORAL_TABLET | ORAL | Status: AC
  Filled 2018-09-20: qty 2

## 2018-09-20 MED ORDER — DIPHENHYDRAMINE HCL 25 MG PO CAPS
25.0000 mg | ORAL_CAPSULE | Freq: Once | ORAL | Status: AC
  Administered 2018-09-20: 25 mg via ORAL

## 2018-09-20 MED ORDER — DIPHENHYDRAMINE HCL 25 MG PO CAPS
ORAL_CAPSULE | ORAL | Status: AC
  Filled 2018-09-20: qty 1

## 2018-09-20 MED ORDER — ACETAMINOPHEN 325 MG PO TABS
650.0000 mg | ORAL_TABLET | Freq: Once | ORAL | Status: AC
  Administered 2018-09-20: 650 mg via ORAL

## 2018-09-20 NOTE — Progress Notes (Signed)
Pt arrived in infusion this morning with mild nosebleed that started late last night. Denies any headaches, sob or dizziness at this time. Pt to receive 1 unit of platelets today, along with 2 units prbc.   Gave pt ice packs to help stop nosebleed. Pt applied pressure x10 minutes. Pt nosebleed stopped after platelets and throughout first unit of blood transfusion.   Pt tolerated all transfusion today, however, pt restarted his nosebleed, very scant amount noticed. Pt VS and pt is asymptomatic. Told pt to call when nosebleed worsens. Pt advised to apply ice once again, and to call on call MD if it doesn't stop. Pt verbalized understanding and will call immediately.

## 2018-09-20 NOTE — Patient Instructions (Signed)
Blood Transfusion, Adult, Care After This sheet gives you information about how to care for yourself after your procedure. Your doctor may also give you more specific instructions. If you have problems or questions, contact your doctor. Follow these instructions at home:   Take over-the-counter and prescription medicines only as told by your doctor.  Go back to your normal activities as told by your doctor.  Follow instructions from your doctor about how to take care of the area where an IV tube was put into your vein (insertion site). Make sure you: ? Wash your hands with soap and water before you change your bandage (dressing). If there is no soap and water, use hand sanitizer. ? Change your bandage as told by your doctor.  Check your IV insertion site every day for signs of infection. Check for: ? More redness, swelling, or pain. ? More fluid or blood. ? Warmth. ? Pus or a bad smell. Contact a doctor if:  You have more redness, swelling, or pain around the IV insertion site.  You have more fluid or blood coming from the IV insertion site.  Your IV insertion site feels warm to the touch.  You have pus or a bad smell coming from the IV insertion site.  Your pee (urine) turns pink, red, or brown.  You feel weak after doing your normal activities. Get help right away if:  You have signs of a serious allergic or body defense (immune) system reaction, including: ? Itchiness. ? Hives. ? Trouble breathing. ? Anxiety. ? Pain in your chest or lower back. ? Fever, flushing, and chills. ? Fast pulse. ? Rash. ? Watery poop (diarrhea). ? Throwing up (vomiting). ? Dark pee. ? Serious headache. ? Dizziness. ? Stiff neck. ? Yellow color in your face or the white parts of your eyes (jaundice). Summary  After a blood transfusion, return to your normal activities as told by your doctor.  Every day, check for signs of infection where the IV tube was put into your vein.  Some  signs of infection are warm skin, more redness and pain, more fluid or blood, and pus or a bad smell where the needle went in.  Contact your doctor if you feel weak or have any unusual symptoms. This information is not intended to replace advice given to you by your health care provider. Make sure you discuss any questions you have with your health care provider. Document Released: 10/01/2014 Document Revised: 05/04/2016 Document Reviewed: 05/04/2016 Elsevier Interactive Patient Education  2019 Elsevier Inc.  

## 2018-09-22 ENCOUNTER — Inpatient Hospital Stay: Payer: Medicare Other

## 2018-09-22 ENCOUNTER — Telehealth: Payer: Self-pay | Admitting: *Deleted

## 2018-09-22 DIAGNOSIS — D61818 Other pancytopenia: Secondary | ICD-10-CM

## 2018-09-22 DIAGNOSIS — Z452 Encounter for adjustment and management of vascular access device: Secondary | ICD-10-CM

## 2018-09-22 DIAGNOSIS — D469 Myelodysplastic syndrome, unspecified: Secondary | ICD-10-CM | POA: Diagnosis not present

## 2018-09-22 LAB — CBC WITH DIFFERENTIAL (CANCER CENTER ONLY)
Abs Immature Granulocytes: 0 10*3/uL (ref 0.00–0.07)
Basophils Absolute: 0 10*3/uL (ref 0.0–0.1)
Basophils Relative: 1 %
Blasts: 16 %
Eosinophils Absolute: 0 10*3/uL (ref 0.0–0.5)
Eosinophils Relative: 0 %
HEMATOCRIT: 28.7 % — AB (ref 39.0–52.0)
Hemoglobin: 9.5 g/dL — ABNORMAL LOW (ref 13.0–17.0)
Lymphocytes Relative: 73 %
Lymphs Abs: 1 10*3/uL (ref 0.7–4.0)
MCH: 29 pg (ref 26.0–34.0)
MCHC: 33.1 g/dL (ref 30.0–36.0)
MCV: 87.5 fL (ref 80.0–100.0)
Metamyelocytes Relative: 1 %
Monocytes Absolute: 0.1 10*3/uL (ref 0.1–1.0)
Monocytes Relative: 6 %
Myelocytes: 1 %
Neutro Abs: 0 10*3/uL — CL (ref 1.7–17.7)
Neutrophils Relative %: 2 %
Platelet Count: 29 10*3/uL — ABNORMAL LOW (ref 150–400)
RBC: 3.28 MIL/uL — ABNORMAL LOW (ref 4.22–5.81)
RDW: 17.3 % — ABNORMAL HIGH (ref 11.5–15.5)
WBC Count: 1.4 10*3/uL — ABNORMAL LOW (ref 4.0–10.5)
nRBC: 0 % (ref 0.0–0.2)

## 2018-09-22 LAB — BPAM RBC
BLOOD PRODUCT EXPIRATION DATE: 202001242359
Blood Product Expiration Date: 202001242359
ISSUE DATE / TIME: 201912280835
ISSUE DATE / TIME: 201912280835
Unit Type and Rh: 5100
Unit Type and Rh: 5100

## 2018-09-22 LAB — TYPE AND SCREEN
ABO/RH(D): O POS
Antibody Screen: NEGATIVE
Unit division: 0
Unit division: 0

## 2018-09-22 LAB — BPAM PLATELET PHERESIS
Blood Product Expiration Date: 201912292359
ISSUE DATE / TIME: 201912280833
Unit Type and Rh: 5100

## 2018-09-22 LAB — PREPARE PLATELET PHERESIS: Unit division: 0

## 2018-09-22 LAB — SAMPLE TO BLOOD BANK

## 2018-09-22 MED ORDER — HEPARIN SOD (PORK) LOCK FLUSH 100 UNIT/ML IV SOLN
500.0000 [IU] | Freq: Once | INTRAVENOUS | Status: AC
  Administered 2018-09-22: 500 [IU]
  Filled 2018-09-22: qty 5

## 2018-09-22 MED ORDER — SODIUM CHLORIDE 0.9% FLUSH
10.0000 mL | Freq: Once | INTRAVENOUS | Status: AC
  Administered 2018-09-22: 10 mL
  Filled 2018-09-22: qty 10

## 2018-09-22 NOTE — Telephone Encounter (Signed)
Notified of CBC results. Encouraged to continue antibiotics, keep appt at [facility] tomorrow and we will see him later this week as scheduled.   Verbalized understanding

## 2018-09-25 ENCOUNTER — Inpatient Hospital Stay: Payer: Medicare Other | Attending: Oncology | Admitting: Oncology

## 2018-09-25 ENCOUNTER — Inpatient Hospital Stay: Payer: Medicare Other

## 2018-09-25 ENCOUNTER — Telehealth: Payer: Self-pay | Admitting: Oncology

## 2018-09-25 VITALS — BP 96/51 | HR 84 | Temp 98.3°F | Resp 18 | Ht 67.0 in | Wt 154.5 lb

## 2018-09-25 DIAGNOSIS — D61818 Other pancytopenia: Secondary | ICD-10-CM | POA: Insufficient documentation

## 2018-09-25 DIAGNOSIS — R61 Generalized hyperhidrosis: Secondary | ICD-10-CM | POA: Insufficient documentation

## 2018-09-25 DIAGNOSIS — D469 Myelodysplastic syndrome, unspecified: Secondary | ICD-10-CM

## 2018-09-25 DIAGNOSIS — K056 Periodontal disease, unspecified: Secondary | ICD-10-CM | POA: Insufficient documentation

## 2018-09-25 DIAGNOSIS — C9200 Acute myeloblastic leukemia, not having achieved remission: Secondary | ICD-10-CM | POA: Insufficient documentation

## 2018-09-25 DIAGNOSIS — E119 Type 2 diabetes mellitus without complications: Secondary | ICD-10-CM

## 2018-09-25 DIAGNOSIS — N289 Disorder of kidney and ureter, unspecified: Secondary | ICD-10-CM | POA: Diagnosis not present

## 2018-09-25 DIAGNOSIS — Z79899 Other long term (current) drug therapy: Secondary | ICD-10-CM | POA: Insufficient documentation

## 2018-09-25 DIAGNOSIS — Z452 Encounter for adjustment and management of vascular access device: Secondary | ICD-10-CM

## 2018-09-25 LAB — CBC WITH DIFFERENTIAL (CANCER CENTER ONLY)
Abs Immature Granulocytes: 0 10*3/uL (ref 0.00–0.07)
BLASTS: 20 %
Basophils Absolute: 0 10*3/uL (ref 0.0–0.1)
Basophils Relative: 0 %
Eosinophils Absolute: 0 10*3/uL (ref 0.0–0.5)
Eosinophils Relative: 0 %
HCT: 26.6 % — ABNORMAL LOW (ref 39.0–52.0)
Hemoglobin: 8.8 g/dL — ABNORMAL LOW (ref 13.0–17.0)
Lymphocytes Relative: 75 %
Lymphs Abs: 1.1 10*3/uL (ref 0.7–4.0)
MCH: 29.2 pg (ref 26.0–34.0)
MCHC: 33.1 g/dL (ref 30.0–36.0)
MCV: 88.4 fL (ref 80.0–100.0)
MYELOCYTES: 1 %
Metamyelocytes Relative: 1 %
Monocytes Absolute: 0 10*3/uL — ABNORMAL LOW (ref 0.1–1.0)
Monocytes Relative: 3 %
NEUTROS ABS: 0 10*3/uL — AB (ref 1.7–17.7)
Neutrophils Relative %: 0 %
Platelet Count: 8 10*3/uL — CL (ref 150–400)
RBC: 3.01 MIL/uL — ABNORMAL LOW (ref 4.22–5.81)
RDW: 17 % — ABNORMAL HIGH (ref 11.5–15.5)
WBC Count: 1.5 10*3/uL — ABNORMAL LOW (ref 4.0–10.5)
nRBC: 0 % (ref 0.0–0.2)

## 2018-09-25 LAB — SAMPLE TO BLOOD BANK

## 2018-09-25 LAB — TYPE AND SCREEN
ABO/RH(D): O POS
Antibody Screen: NEGATIVE

## 2018-09-25 MED ORDER — SODIUM CHLORIDE 0.9% FLUSH
10.0000 mL | Freq: Once | INTRAVENOUS | Status: AC
  Administered 2018-09-25: 10 mL
  Filled 2018-09-25: qty 10

## 2018-09-25 MED ORDER — HEPARIN SOD (PORK) LOCK FLUSH 100 UNIT/ML IV SOLN
500.0000 [IU] | Freq: Once | INTRAVENOUS | Status: AC
  Administered 2018-09-25: 250 [IU]
  Filled 2018-09-25: qty 5

## 2018-09-25 NOTE — Progress Notes (Signed)
[facility] OFFICE PROGRESS NOTE   Diagnosis: MDS/AML  INTERVAL HISTORY:   Mr. Cornwall saw Dr. Florene Glen on 09/23/2018.  They discussed treatment options for the AML.  Eluding standard induction chemotherapy and low-dose cytarabine plus venetoclax.  He decided on treatment with ara-C and venetoclax.  Mr. Burlingame scheduled to be admitted for the first cycle next week.  He reports feeling well.  He had a fever 2 nights ago.  No pain at the rectum.  He has night sweats.  No bleeding.  Objective:  Vital signs in last 24 hours:  Blood pressure (!) 96/51, pulse 84, temperature 98.3 F (36.8 C), temperature source Oral, resp. rate 18, height '5\' 7"'$  (1.702 m), weight 154 lb 8 oz (70.1 kg), SpO2 100 %.    HEENT: No thrush, mild oozing at the upper gumline Resp: Lungs clear bilaterally Cardio: Regular rate and rhythm GI: No hepatosplenomegaly, nontender, at the upper perineum there is a 2 cm ulcer covered with stool Vascular: No leg edema    Portacath/PICC-without erythema  Lab Results:  Lab Results  Component Value Date   WBC 1.5 (L) 09/25/2018   HGB 8.8 (L) 09/25/2018   HCT 26.6 (L) 09/25/2018   MCV 88.4 09/25/2018   PLT 8 (LL) 09/25/2018   NEUTROABS 0.0 (LL) 09/25/2018    CMP  Lab Results  Component Value Date   NA 138 09/05/2018   K 4.2 09/05/2018   CL 104 09/05/2018   CO2 25 09/05/2018   GLUCOSE 128 (H) 09/05/2018   BUN 27 (H) 09/05/2018   CREATININE 0.84 09/05/2018   CALCIUM 9.5 09/05/2018   PROT 8.1 09/05/2018   ALBUMIN 3.2 (L) 09/05/2018   AST 10 (L) 09/05/2018   ALT 12 09/05/2018   ALKPHOS 142 (H) 09/05/2018   BILITOT 0.3 09/05/2018   GFRNONAA >60 09/05/2018   GFRAA >60 09/05/2018    Medications: I have reviewed the patient's current medications.   Assessment/Plan: 1. 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  Cycle 1 5-azacytidine 05/06/2018  Cycle25-azacytidine 06/16/2018  Bone marrow biopsy 07/09/2018-markedly hypocellular with decreased myeloid elements. No blast population. Felt to likely be related to a treatment effect.  Trial of G-CSF 07/14/2018-07/26/2018  Bone marrow biopsy 09/04/2018- limited aspirate and clot material, foci of myeloblast on the biopsy specimen, cytogenetics: 3p-, 5q-, -7, +21, FISH:+8, 7q-, 5q- 2.Renal insufficiency 3.Diabetes 4.Hemoccult positive stool  Upper endoscopy 04/03/2018-chronic gastritis, duodenitis  Colonoscopy 04/04/2018-diverticulosis in the sigmoid colon 5.Right lower lobe 7 mm subpleural nodule on chest CT 04/02/2018 6.Admission with febrile neutropenia 05/20/2018- placed on cefepime, antibiotics changed to vancomycin/meropenem on 05/21/2018, anidulafungin added 9/1/2019antibiotics stopped 05/31/2018, discharge 06/03/2018 with prophylactic acyclovir, Bactrim, and Diflucan. Bactrim discontinued and Levaquin started 06/05/2018 7. 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  He is scheduled to be admitted for treatment with cytarabine and venetoclax at [facility] next week.  He has persistent severe pancytopenia.  He continues antibiotic  prophylaxis.  I recommended he contact us for a fever or chills.  He will be transfused with platelets within the next 1-2 days.  I recommended he resume sitz bath for the perineal ulcer. He will return for an office visit here on 10/08/2018.  We are available to see him sooner as needed. We recommended he discontinue benazepril. 25 minutes were spent with the patient today.  The majority of the time was used for counseling and coordination of care.   Betsy Coder, MD  09/25/2018  5:41 PM

## 2018-09-25 NOTE — Telephone Encounter (Signed)
Printed calendar and avs. °

## 2018-09-26 ENCOUNTER — Inpatient Hospital Stay: Payer: Medicare Other

## 2018-09-26 DIAGNOSIS — D469 Myelodysplastic syndrome, unspecified: Secondary | ICD-10-CM

## 2018-09-26 DIAGNOSIS — D61818 Other pancytopenia: Secondary | ICD-10-CM

## 2018-09-26 DIAGNOSIS — C9200 Acute myeloblastic leukemia, not having achieved remission: Secondary | ICD-10-CM | POA: Diagnosis not present

## 2018-09-26 MED ORDER — ACETAMINOPHEN 325 MG PO TABS
ORAL_TABLET | ORAL | Status: AC
  Filled 2018-09-26: qty 2

## 2018-09-26 MED ORDER — DIPHENHYDRAMINE HCL 25 MG PO CAPS
ORAL_CAPSULE | ORAL | Status: AC
  Filled 2018-09-26: qty 1

## 2018-09-26 MED ORDER — HEPARIN SOD (PORK) LOCK FLUSH 100 UNIT/ML IV SOLN
250.0000 [IU] | INTRAVENOUS | Status: AC | PRN
  Administered 2018-09-26: 250 [IU]
  Filled 2018-09-26: qty 5

## 2018-09-26 MED ORDER — SODIUM CHLORIDE 0.9% IV SOLUTION
250.0000 mL | Freq: Once | INTRAVENOUS | Status: AC
  Administered 2018-09-26: 250 mL via INTRAVENOUS
  Filled 2018-09-26: qty 250

## 2018-09-26 MED ORDER — ACETAMINOPHEN 325 MG PO TABS
650.0000 mg | ORAL_TABLET | Freq: Once | ORAL | Status: AC
  Administered 2018-09-26: 650 mg via ORAL

## 2018-09-26 MED ORDER — SODIUM CHLORIDE 0.9% FLUSH
10.0000 mL | INTRAVENOUS | Status: AC | PRN
  Administered 2018-09-26: 10 mL
  Filled 2018-09-26: qty 10

## 2018-09-26 MED ORDER — DIPHENHYDRAMINE HCL 25 MG PO CAPS
25.0000 mg | ORAL_CAPSULE | Freq: Once | ORAL | Status: AC
  Administered 2018-09-26: 25 mg via ORAL

## 2018-09-26 NOTE — Patient Instructions (Signed)

## 2018-09-27 LAB — PREPARE PLATELET PHERESIS: Unit division: 0

## 2018-09-27 LAB — BPAM PLATELET PHERESIS
Blood Product Expiration Date: 202001052359
ISSUE DATE / TIME: 202001030930
Unit Type and Rh: 5100

## 2018-09-30 ENCOUNTER — Other Ambulatory Visit: Payer: Self-pay | Admitting: *Deleted

## 2018-10-06 ENCOUNTER — Other Ambulatory Visit (HOSPITAL_COMMUNITY): Payer: Self-pay | Admitting: Nurse Practitioner

## 2018-10-06 DIAGNOSIS — D61818 Other pancytopenia: Secondary | ICD-10-CM

## 2018-10-07 MED ORDER — ALBUTEROL SULFATE HFA 108 (90 BASE) MCG/ACT IN AERS
2.00 | INHALATION_SPRAY | RESPIRATORY_TRACT | Status: DC
Start: ? — End: 2018-10-07

## 2018-10-07 MED ORDER — GENERIC EXTERNAL MEDICATION
1.00 | Status: DC
Start: 2018-12-11 — End: 2018-10-07

## 2018-10-07 MED ORDER — ACYCLOVIR 400 MG PO TABS
400.00 | ORAL_TABLET | ORAL | Status: DC
Start: 2018-12-11 — End: 2018-10-07

## 2018-10-07 MED ORDER — ACETAMINOPHEN 325 MG PO TABS
650.00 | ORAL_TABLET | ORAL | Status: DC
Start: ? — End: 2018-10-07

## 2018-10-07 MED ORDER — DORZOLAMIDE HCL-TIMOLOL MAL 22.3-6.8 MG/ML OP SOLN
1.00 | OPHTHALMIC | Status: DC
Start: 2018-12-11 — End: 2018-10-07

## 2018-10-07 MED ORDER — TRAMADOL HCL 50 MG PO TABS
50.00 | ORAL_TABLET | ORAL | Status: DC
Start: ? — End: 2018-10-07

## 2018-10-07 MED ORDER — AMLODIPINE BESYLATE 5 MG PO TABS
10.00 | ORAL_TABLET | ORAL | Status: DC
Start: 2018-10-25 — End: 2018-10-07

## 2018-10-07 MED ORDER — DIPHENHYDRAMINE HCL 25 MG PO CAPS
25.00 | ORAL_CAPSULE | ORAL | Status: DC
Start: ? — End: 2018-10-07

## 2018-10-07 MED ORDER — GEMFIBROZIL 600 MG PO TABS
600.00 | ORAL_TABLET | ORAL | Status: DC
Start: 2018-12-11 — End: 2018-10-07

## 2018-10-07 MED ORDER — ALLOPURINOL 300 MG PO TABS
300.00 | ORAL_TABLET | ORAL | Status: DC
Start: 2018-10-25 — End: 2018-10-07

## 2018-10-07 MED ORDER — DOCUSATE SODIUM 100 MG PO CAPS
100.00 | ORAL_CAPSULE | ORAL | Status: DC
Start: 2018-12-12 — End: 2018-10-07

## 2018-10-07 MED ORDER — FLUCONAZOLE 200 MG PO TABS
200.00 | ORAL_TABLET | ORAL | Status: DC
Start: 2018-10-08 — End: 2018-10-07

## 2018-10-07 MED ORDER — MONTELUKAST SODIUM 10 MG PO TABS
10.00 | ORAL_TABLET | ORAL | Status: DC
Start: 2018-12-11 — End: 2018-10-07

## 2018-10-07 MED ORDER — PANTOPRAZOLE SODIUM 40 MG PO TBEC
40.00 | DELAYED_RELEASE_TABLET | ORAL | Status: DC
Start: 2018-10-18 — End: 2018-10-07

## 2018-10-07 MED ORDER — ONDANSETRON 4 MG PO TBDP
4.00 | ORAL_TABLET | ORAL | Status: DC
Start: ? — End: 2018-10-07

## 2018-10-07 MED ORDER — VENETOCLAX 100 MG PO TABS
300.00 | ORAL_TABLET | ORAL | Status: DC
Start: 2018-10-24 — End: 2018-10-07

## 2018-10-07 MED ORDER — ONDANSETRON HCL 8 MG PO TABS
8.00 | ORAL_TABLET | ORAL | Status: DC
Start: ? — End: 2018-10-07

## 2018-10-07 MED ORDER — GENERIC EXTERNAL MEDICATION
5.00 | Status: DC
Start: ? — End: 2018-10-07

## 2018-10-07 MED ORDER — SODIUM CHLORIDE 0.9 % IV SOLN
250.00 | INTRAVENOUS | Status: DC
Start: ? — End: 2018-10-07

## 2018-10-07 MED ORDER — GENERIC EXTERNAL MEDICATION
Status: DC
Start: 2018-10-24 — End: 2018-10-07

## 2018-10-07 MED ORDER — GENERIC EXTERNAL MEDICATION
3.38 | Status: DC
Start: 2018-10-24 — End: 2018-10-07

## 2018-10-07 MED ORDER — GENERIC EXTERNAL MEDICATION
20.00 | Status: DC
Start: 2018-10-07 — End: 2018-10-07

## 2018-10-08 ENCOUNTER — Inpatient Hospital Stay: Payer: Medicare Other

## 2018-10-08 ENCOUNTER — Other Ambulatory Visit: Payer: Self-pay

## 2018-10-08 ENCOUNTER — Inpatient Hospital Stay: Payer: Medicare Other | Admitting: Nurse Practitioner

## 2018-10-08 ENCOUNTER — Ambulatory Visit: Payer: Self-pay | Admitting: Nurse Practitioner

## 2018-10-17 MED ORDER — GENERIC EXTERNAL MEDICATION
30.00 | Status: DC
Start: 2018-10-17 — End: 2018-10-17

## 2018-10-17 MED ORDER — GENERIC EXTERNAL MEDICATION
1.00 | Status: DC
Start: 2018-12-12 — End: 2018-10-17

## 2018-10-17 MED ORDER — GUAIFENESIN ER 600 MG PO TB12
600.00 | ORAL_TABLET | ORAL | Status: DC
Start: 2018-10-17 — End: 2018-10-17

## 2018-10-17 MED ORDER — GENERIC EXTERNAL MEDICATION
1.25 | Status: DC
Start: 2018-10-17 — End: 2018-10-17

## 2018-10-17 MED ORDER — ZINC SULFATE 220 (50 ZN) MG PO CAPS
220.00 | ORAL_CAPSULE | ORAL | Status: DC
Start: 2018-10-25 — End: 2018-10-17

## 2018-10-17 MED ORDER — FLUCONAZOLE 200 MG PO TABS
200.00 | ORAL_TABLET | ORAL | Status: DC
Start: 2018-10-18 — End: 2018-10-17

## 2018-10-17 MED ORDER — NAPHAZOLINE-PHENIRAMINE 0.025-0.3 % OP SOLN
1.00 | OPHTHALMIC | Status: DC
Start: 2018-10-17 — End: 2018-10-17

## 2018-10-17 MED ORDER — NYSTATIN 100000 UNIT/ML MT SUSP
500000.00 | OROMUCOSAL | Status: DC
Start: 2018-10-24 — End: 2018-10-17

## 2018-10-24 MED ORDER — PANTOPRAZOLE SODIUM 40 MG PO TBEC
40.00 | DELAYED_RELEASE_TABLET | ORAL | Status: DC
Start: 2018-10-24 — End: 2018-10-24

## 2018-10-24 MED ORDER — GENERIC EXTERNAL MEDICATION
100.00 | Status: DC
Start: 2018-10-24 — End: 2018-10-24

## 2018-10-24 MED ORDER — GENERIC EXTERNAL MEDICATION
1.50 | Status: DC
Start: 2018-10-24 — End: 2018-10-24

## 2018-10-24 MED ORDER — GENERIC EXTERNAL MEDICATION
30.00 | Status: DC
Start: ? — End: 2018-10-24

## 2018-12-04 ENCOUNTER — Telehealth: Payer: Self-pay | Admitting: *Deleted

## 2018-12-04 NOTE — Telephone Encounter (Addendum)
Discharge planned from [facility] on 12/08/18 to [facility] in [place]. Needs labs every Mon/Thurs with possible transfusions. Still has PICC line--will send copy of placement if this is a new one. Scheduling message sent and and [facility] will fax orders/information. Per Dr. [name]: Will work him in to be seen on 3/19 as well.

## 2018-12-09 ENCOUNTER — Telehealth: Payer: Self-pay | Admitting: *Deleted

## 2018-12-09 ENCOUNTER — Other Ambulatory Visit: Payer: Self-pay | Admitting: *Deleted

## 2018-12-09 MED ORDER — GENERIC EXTERNAL MEDICATION
Status: DC
Start: 2018-12-11 — End: 2018-12-09

## 2018-12-09 MED ORDER — ONDANSETRON HCL 8 MG PO TABS
8.00 | ORAL_TABLET | ORAL | Status: DC
Start: ? — End: 2018-12-09

## 2018-12-09 MED ORDER — GENERIC EXTERNAL MEDICATION
8.60 | Status: DC
Start: ? — End: 2018-12-09

## 2018-12-09 MED ORDER — OXYMETAZOLINE HCL 0.05 % NA SOLN
2.00 | NASAL | Status: DC
Start: ? — End: 2018-12-09

## 2018-12-09 MED ORDER — GENERIC EXTERNAL MEDICATION
3.38 | Status: DC
Start: 2018-12-11 — End: 2018-12-09

## 2018-12-09 MED ORDER — HYDROXYUREA 500 MG PO CAPS
500.00 | ORAL_CAPSULE | ORAL | Status: DC
Start: 2018-12-09 — End: 2018-12-09

## 2018-12-09 MED ORDER — DIPHENHYDRAMINE HCL 12.5 MG/5ML PO ELIX
12.50 | ORAL_SOLUTION | ORAL | Status: DC
Start: ? — End: 2018-12-09

## 2018-12-09 MED ORDER — VORICONAZOLE 200 MG PO TABS
200.00 | ORAL_TABLET | ORAL | Status: DC
Start: 2018-12-11 — End: 2018-12-09

## 2018-12-09 MED ORDER — GENERIC EXTERNAL MEDICATION
5.00 | Status: DC
Start: ? — End: 2018-12-09

## 2018-12-09 MED ORDER — POLYETHYLENE GLYCOL 3350 17 G PO PACK
17.00 | PACK | ORAL | Status: DC
Start: ? — End: 2018-12-09

## 2018-12-09 MED ORDER — GENERIC EXTERNAL MEDICATION
40.00 | Status: DC
Start: 2018-12-11 — End: 2018-12-09

## 2018-12-09 NOTE — Telephone Encounter (Addendum)
Left VM X 2 today requesting return call to confirm patient left [facility] on 3/16 as planned and admitted to [facility] facility in [place]. Also requested copy of discharge summary. Left direct # and fax #.

## 2018-12-10 ENCOUNTER — Other Ambulatory Visit: Payer: Self-pay | Admitting: *Deleted

## 2018-12-10 ENCOUNTER — Telehealth: Payer: Self-pay | Admitting: *Deleted

## 2018-12-10 DIAGNOSIS — D469 Myelodysplastic syndrome, unspecified: Secondary | ICD-10-CM

## 2018-12-10 NOTE — Telephone Encounter (Signed)
VM from Angie, RN that patient is still at [facility]. Now in negotiations as to what facility he will be transferred to. "He may or may not be discharged tonight". If she hears anything today before she leave she will call or will update in am. She is faxing his note from today as well as current meds and lab orders with transfusion parameters and premeds required.

## 2018-12-10 NOTE — Telephone Encounter (Signed)
I saw he is on schedule next week  Is he [facility]B? Continuing to receive transfusions?

## 2018-12-11 ENCOUNTER — Inpatient Hospital Stay: Payer: Medicare Other | Admitting: Oncology

## 2018-12-11 ENCOUNTER — Telehealth: Payer: Self-pay | Admitting: Oncology

## 2018-12-11 ENCOUNTER — Inpatient Hospital Stay: Payer: Medicare Other

## 2018-12-11 ENCOUNTER — Other Ambulatory Visit: Payer: Self-pay | Admitting: *Deleted

## 2018-12-11 DIAGNOSIS — D61818 Other pancytopenia: Secondary | ICD-10-CM

## 2018-12-11 DIAGNOSIS — C9000 Multiple myeloma not having achieved remission: Secondary | ICD-10-CM

## 2018-12-11 NOTE — Telephone Encounter (Signed)
Scheduled appt per 3/19 sch message - unable to reach pt . Left message with appt date and time

## 2018-12-12 ENCOUNTER — Inpatient Hospital Stay: Payer: Medicare Other

## 2018-12-13 ENCOUNTER — Emergency Department (HOSPITAL_COMMUNITY)
Admission: EM | Admit: 2018-12-13 | Discharge: 2018-12-13 | Disposition: A | Discharge status: Other Acute Inpatient Hospital | Payer: Medicare Other | Attending: Emergency Medicine | Admitting: Emergency Medicine

## 2018-12-13 ENCOUNTER — Emergency Department (HOSPITAL_COMMUNITY): Payer: Medicare Other

## 2018-12-13 DIAGNOSIS — I10 Essential (primary) hypertension: Secondary | ICD-10-CM | POA: Insufficient documentation

## 2018-12-13 DIAGNOSIS — J449 Chronic obstructive pulmonary disease, unspecified: Secondary | ICD-10-CM | POA: Diagnosis not present

## 2018-12-13 DIAGNOSIS — E119 Type 2 diabetes mellitus without complications: Secondary | ICD-10-CM | POA: Diagnosis not present

## 2018-12-13 DIAGNOSIS — Z79899 Other long term (current) drug therapy: Secondary | ICD-10-CM | POA: Insufficient documentation

## 2018-12-13 DIAGNOSIS — D696 Thrombocytopenia, unspecified: Secondary | ICD-10-CM | POA: Diagnosis not present

## 2018-12-13 DIAGNOSIS — R509 Fever, unspecified: Secondary | ICD-10-CM

## 2018-12-13 DIAGNOSIS — J45909 Unspecified asthma, uncomplicated: Secondary | ICD-10-CM | POA: Insufficient documentation

## 2018-12-13 DIAGNOSIS — Z87891 Personal history of nicotine dependence: Secondary | ICD-10-CM | POA: Insufficient documentation

## 2018-12-13 DIAGNOSIS — C9202 Acute myeloblastic leukemia, in relapse: Secondary | ICD-10-CM

## 2018-12-13 DIAGNOSIS — D709 Neutropenia, unspecified: Secondary | ICD-10-CM | POA: Insufficient documentation

## 2018-12-13 DIAGNOSIS — R5081 Fever presenting with conditions classified elsewhere: Secondary | ICD-10-CM

## 2018-12-13 LAB — COMPREHENSIVE METABOLIC PANEL
ALT: 12 U/L (ref 0–44)
AST: 29 U/L (ref 15–41)
Albumin: 2.4 g/dL — ABNORMAL LOW (ref 3.5–5.0)
Alkaline Phosphatase: 170 U/L — ABNORMAL HIGH (ref 38–126)
Anion gap: 9 (ref 5–15)
BUN: 10 mg/dL (ref 8–23)
CO2: 22 mmol/L (ref 22–32)
Calcium: 8.9 mg/dL (ref 8.9–10.3)
Chloride: 101 mmol/L (ref 98–111)
Creatinine, Ser: 0.73 mg/dL (ref 0.61–1.24)
GFR calc non Af Amer: >60 mL/min (ref >60)
Glucose, Bld: 104 mg/dL — ABNORMAL HIGH (ref 70–99)
Potassium: 3.5 mmol/L (ref 3.5–5.1)
Sodium: 132 mmol/L — ABNORMAL LOW (ref 135–145)
Total Bilirubin: 0.6 mg/dL (ref 0.3–1.2)
Total Protein: 8.4 g/dL — ABNORMAL HIGH (ref 6.5–8.1)

## 2018-12-13 LAB — CBC WITH DIFFERENTIAL/PLATELET
Band Neutrophils: 0 %
Basophils Absolute: 0 10*3/uL (ref 0.0–0.1)
Basophils Relative: 0 %
Blasts: 84 %
Eosinophils Absolute: 0 10*3/uL (ref 0.0–0.5)
Eosinophils Relative: 0 %
HEMATOCRIT: 25.3 % — AB (ref 39.0–52.0)
Hemoglobin: 8.2 g/dL — ABNORMAL LOW (ref 13.0–17.0)
LYMPHS PCT: 15 %
Lymphs Abs: 2.3 10*3/uL (ref 0.7–4.0)
MCH: 28.7 pg (ref 26.0–34.0)
MCHC: 32.4 g/dL (ref 30.0–36.0)
MCV: 88.5 fL (ref 80.0–100.0)
Metamyelocytes Relative: 0 %
Monocytes Absolute: 0.2 10*3/uL (ref 0.1–1.0)
Monocytes Relative: 1 %
Myelocytes: 0 %
NEUTROS PCT: 0 %
NRBC: 0 % (ref 0.0–0.2)
Neutro Abs: 0 10*3/uL — ABNORMAL LOW (ref 1.7–7.7)
Other: 0 %
Platelets: 6 10*3/uL — CL (ref 150–400)
Promyelocytes Relative: 0 %
RBC: 2.86 MIL/uL — ABNORMAL LOW (ref 4.22–5.81)
RDW: 14.3 % (ref 11.5–15.5)
WBC: 15.4 10*3/uL — ABNORMAL HIGH (ref 4.0–10.5)
nRBC: 0 /100 WBC

## 2018-12-13 LAB — LACTIC ACID, PLASMA: Lactic Acid, Venous: 1.3 mmol/L (ref 0.5–1.9)

## 2018-12-13 MED ORDER — SODIUM CHLORIDE 0.9 % IV SOLN
1000.0000 mL | INTRAVENOUS | Status: DC
  Administered 2018-12-13: 1000 mL via INTRAVENOUS

## 2018-12-13 MED ORDER — HYDROXYUREA 500 MG PO CAPS
1000.00 | ORAL_CAPSULE | ORAL | Status: DC
Start: 2018-12-12 — End: 2018-12-13

## 2018-12-13 MED ORDER — SODIUM CHLORIDE (PF) 0.9 % IJ SOLN
INTRAMUSCULAR | Status: AC
  Filled 2018-12-13: qty 50

## 2018-12-13 MED ORDER — PIPERACILLIN-TAZOBACTAM 3.375 G IVPB 30 MIN
3.3750 g | Freq: Once | INTRAVENOUS | Status: AC
  Administered 2018-12-13: 3.375 g via INTRAVENOUS
  Filled 2018-12-13: qty 50

## 2018-12-13 MED ORDER — IOHEXOL 300 MG/ML  SOLN
100.0000 mL | Freq: Once | INTRAMUSCULAR | Status: AC | PRN
  Administered 2018-12-13: 80 mL via INTRAVENOUS

## 2018-12-13 MED ORDER — ALLOPURINOL 300 MG PO TABS
300.00 | ORAL_TABLET | ORAL | Status: DC
Start: 2018-12-12 — End: 2018-12-13

## 2018-12-13 NOTE — ED Triage Notes (Signed)
Transported by Corey Harold from [facility]. CC: fever. Patient recently discharged from [facility] for same. Has a history of leukemia and a rectal abscess. 1000 mg of Tylenol given to patient at 0930 this morning. VSS. (Fever obtained-101.6)

## 2018-12-13 NOTE — ED Provider Notes (Signed)
[place] DEPT Provider Note   CSN: [id number] Arrival date & time: 12/13/18  1042    History   Chief Complaint Chief Complaint  Patient presents with   Fever    HPI Andrew [name] is a 68 y.o. male.     Patient brought in by PT R from [place] home.  Patient had documented fever there this morning at 9 30-101.6.  He was given 1 g of Tylenol.  Patient had a long extensive hospitalization at [facility] from January 7 until March 18 he was on the hematology oncology service.  Had a complicated course.  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  Complicated by a fungal pneumonia sepsis.  Patient also has a open wound decubitus in the perianal sacral area.  It is open.  Patient upon arrival here without any complaints.  States that he feels fine.  Patient's arrangement was to go to [facility] and Dr. [name] from hematology oncology here would follow him as an outpatient.  Because patient has required platelet transfusions at times and may be given blood transfusion.  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  There was some discussion about hospice and comfort care but patient was not quite ready to make those decisions yet.  Patient has a PICC line.  In his right arm.     Past Medical History:  Diagnosis Date   Abdominal bloating    probable lactose intolorence   Anemia 04/03/2018   Asthma    COPD (chronic obstructive pulmonary disease) (HCC)    mild improvement   DM (diabetes mellitus) ([place])    Hearing impairment    asymptomatic   HTN (hypertension)    Tobacco use    Vitamin D deficiency     Patient Active Problem List   Diagnosis Date Noted   Malnutrition of moderate degree 07/10/2018   Pressure injury of skin 07/10/2018   Rectal abscess 07/08/2018   Sepsis ([place]) 07/07/2018   PICC (peripherally inserted central catheter) flush 06/13/2018   Febrile neutropenia ([place])    Renal insufficiency 05/24/2018   Atherosclerosis of aorta ([place])  05/24/2018   Poor dentition 05/24/2018   Neutropenic fever ([place]) 05/20/2018   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   Goals of care, counseling/discussion 05/06/2018   Pancytopenia ([place]) 04/02/2018   Allergic rhinitis 10/24/2012   HTN (hypertension)    COPD (chronic obstructive pulmonary disease) ([place])    Asthma    DM (diabetes mellitus) ([place])    Tobacco use     Past Surgical History:  Procedure Laterality Date   BIOPSY  04/03/2018   Procedure: BIOPSY;  Surgeon: Otis Brace, MD;  Location: [facility] ENDOSCOPY;  Service: Gastroenterology;;   COLONOSCOPY WITH PROPOFOL N/A 04/04/2018   Procedure: COLONOSCOPY WITH PROPOFOL;  Surgeon: Otis Brace, MD;  Location: [facility] ENDOSCOPY;  Service: Gastroenterology;  Laterality: N/A;   ESOPHAGOGASTRODUODENOSCOPY (EGD) WITH PROPOFOL N/A 04/03/2018   Procedure: ESOPHAGOGASTRODUODENOSCOPY (EGD) WITH PROPOFOL;  Surgeon: Otis Brace, MD;  Location: [place];  Service: Gastroenterology;  Laterality: N/A;   EYE SURGERY          Home Medications    Prior to Admission medications   Medication Sig Start Date End Date Taking? Authorizing Provider  acyclovir (ZOVIRAX) 400 MG tablet TAKE 1 TABLET(400 MG) BY MOUTH TWICE DAILY Patient taking differently: Take 400 mg by mouth 2 (two) times daily.  10/07/18  Yes Ladell Pier, MD  allopurinol (ZYLOPRIM) 300 MG tablet Take 300 mg by mouth daily. 09/23/18  Yes [provider]  Cholecalciferol (VITAMIN D-3) 25 MCG (1000 UT) CAPS Take 1 capsule by mouth daily.   Yes [provider]  docusate sodium (COLACE) 100 MG capsule Take 100 mg by mouth daily.   Yes [provider]  dorzolamide-timolol (COSOPT) 22.3-6.8 MG/ML ophthalmic solution Place 1 drop into both eyes 2 (two) times daily.   Yes [provider]  gemfibrozil (LOPID) 600 MG tablet Take 600 mg by mouth 2 (two) times daily.  08/07/18  Yes [provider]  hydroxyurea  (HYDREA) 500 MG capsule Take 1,000 mg by mouth daily. May take with food to minimize GI side effects.    Yes [provider]  levofloxacin (LEVAQUIN) 500 MG tablet Take 1 tablet (500 mg total) by mouth daily. 09/08/18  Yes Ladell Pier, MD  montelukast (SINGULAIR) 10 MG tablet Take 10 mg by mouth daily.  05/10/18  Yes [provider]  pantoprazole (PROTONIX) 40 MG tablet Take 1 tablet (40 mg total) by mouth 2 (two) times daily. 04/04/18  Yes Hosie Poisson, MD  polyethylene glycol (MIRALAX / GLYCOLAX) packet Take 17 g by mouth daily.   Yes [provider]  TRAVATAN Z 0.004 % SOLN ophthalmic solution Place 1 drop into both eyes every evening. 02/22/18  Yes [provider]  voriconazole (VFEND) 200 MG tablet Take 200 mg by mouth 2 (two) times daily. For 90 days   Yes [provider]  fluconazole (DIFLUCAN) 50 MG tablet Take 1 tablet (50 mg total) by mouth daily. Patient not taking: Reported on 12/13/2018 09/15/18   Ladell Pier, MD  prochlorperazine (COMPAZINE) 5 MG tablet TAKE 1 TABLET(5 MG) BY MOUTH EVERY 6 HOURS AS NEEDED FOR NAUSEA OR VOMITING Patient not taking: No sig reported 05/27/18   Owens Shark, NP  traMADol (ULTRAM) 50 MG tablet Take 1 tablet (50 mg total) by mouth every 6 (six) hours as needed. Patient not taking: Reported on 09/25/2018 06/25/18   Ladell Pier, MD    Family History No family history on file.  Social History Social History   Tobacco Use   Smoking status: Former Smoker    Packs/day: 0.25    Types: Cigarettes    Last attempt to quit: 03/02/2018    Years since quitting: 0.7   Smokeless tobacco: Never Used  Substance Use Topics   Alcohol use: Yes    Frequency: Never    Comment: occ   Drug use: Never     Allergies   Shellfish allergy   Review of Systems Review of Systems  Constitutional: Positive for fever. Negative for chills.  HENT: Negative for congestion, rhinorrhea and sore throat.   Eyes:  Negative for visual disturbance.  Respiratory: Negative for cough and shortness of breath.   Cardiovascular: Negative for chest pain and leg swelling.  Gastrointestinal: Negative for abdominal pain, diarrhea, nausea and vomiting.  Genitourinary: Negative for dysuria.  Musculoskeletal: Negative for back pain and neck pain.  Skin: Negative for rash.  Neurological: Negative for dizziness, light-headedness and headaches.  Hematological: Does not bruise/bleed easily.  Psychiatric/Behavioral: Negative for confusion.     Physical Exam Updated Vital Signs BP 128/75    Pulse 94    Temp 99.2 F (37.3 C) (Rectal)    Resp (!) 25    SpO2 100%   Physical Exam Vitals signs and nursing note reviewed.  Constitutional:      Appearance: He is well-developed.     Comments: Patient somewhat cachectic.  HENT:  Head: Normocephalic and atraumatic.     Mouth/Throat:     Mouth: Mucous membranes are moist.  Eyes:     Extraocular Movements: Extraocular movements intact.     Conjunctiva/sclera: Conjunctivae normal.     Pupils: Pupils are equal, round, and reactive to light.  Neck:     Musculoskeletal: Normal range of motion and neck supple.  Cardiovascular:     Rate and Rhythm: Normal rate and regular rhythm.     Heart sounds: No murmur.  Pulmonary:     Effort: Pulmonary effort is normal. No respiratory distress.     Breath sounds: Normal breath sounds.  Abdominal:     General: Abdomen is flat. Bowel sounds are normal.     Palpations: Abdomen is soft.     Tenderness: There is no abdominal tenderness.     Comments: Abdomen soft nontender  Genitourinary:    Comments: Buttocks area with an open wound measuring about 6 cm.  No pus.  Not very good granulation tissue.  Patient states that that does not bother him anymore than it has any was told just a couple days ago was healing well no comparison available to tell whether this is better or worse.  Musculoskeletal: Normal range of motion.         General: No swelling.     Comments: PICC line in right upper extremity.  Skin:    General: Skin is warm and dry.  Neurological:     General: No focal deficit present.     Mental Status: He is alert and oriented to person, place, and time.     Cranial Nerves: No cranial nerve deficit.     Sensory: No sensory deficit.     Motor: No weakness.      ED Treatments / Results  Labs (all labs ordered are listed, but only abnormal results are displayed) Labs Reviewed  COMPREHENSIVE METABOLIC PANEL - Abnormal; Notable for the following components:      Result Value   Sodium 132 (*)    Glucose, Bld 104 (*)    Total Protein 8.4 (*)    Albumin 2.4 (*)    Alkaline Phosphatase 170 (*)    All other components within normal limits  CBC WITH DIFFERENTIAL/PLATELET - Abnormal; Notable for the following components:   WBC 15.4 (*)    RBC 2.86 (*)    Hemoglobin 8.2 (*)    HCT 25.3 (*)    Platelets 6 (*)    Neutro Abs 0.0 (*)    All other components within normal limits  CULTURE, BLOOD (ROUTINE X 2)  CULTURE, BLOOD (ROUTINE X 2)  LACTIC ACID, PLASMA  LACTIC ACID, PLASMA  URINALYSIS, ROUTINE W REFLEX MICROSCOPIC  PATHOLOGIST SMEAR REVIEW    EKG EKG Interpretation  Date/Time:  Saturday December 13 2018 11:36:33 EDT Ventricular Rate:  93 PR Interval:    QRS Duration: 140 QT Interval:  388 QTC Calculation: 483 R Axis:   85 Text Interpretation:  Sinus rhythm Right bundle branch block Inferior infarct, acute Lateral leads are also involved No chest pain No significant change since last tracing Confirmed by Fredia Sorrow [phone]) on 12/13/2018 11:42:55 AM   Radiology Dg Chest 2 View  Result Date: 12/13/2018 CLINICAL DATA:  Fever and dry cough. History of leukemia, asthma, diabetes, hypertension and COPD. EXAM: CHEST - 2 VIEW COMPARISON:  Chest x-ray dated 07/07/2018. FINDINGS: Heart size and mediastinal contours are within normal limits. RIGHT-sided PICC line appears well positioned with tip  at the  level of the mid/lower SVC. Coarse lung markings again noted bilaterally suggesting chronic bronchitic change and/or chronic interstitial lung disease. No new confluent opacity to suggest a developing pneumonia or pulmonary edema. No pleural effusion or pneumothorax seen. No acute or suspicious osseous finding. IMPRESSION: 1. No active cardiopulmonary disease. No evidence of pneumonia or pulmonary edema. 2. Probable chronic bronchitic change and/or chronic interstitial lung disease. Electronically Signed   By: Franki Cabot M.D.   On: 12/13/2018 12:47   Ct Abdomen Pelvis W Contrast  Result Date: 12/13/2018 CLINICAL DATA:  Fever. History of leukemia and rectal abscess. Abdominal pain. Abscess suspected. EXAM: CT ABDOMEN AND PELVIS WITH CONTRAST TECHNIQUE: Multidetector CT imaging of the abdomen and pelvis was performed using the standard protocol following bolus administration of intravenous contrast. CONTRAST:  30mL OMNIPAQUE IOHEXOL 300 MG/ML  SOLN COMPARISON:  CT abdomen dated 07/07/2018. FINDINGS: Lower chest: Nodular consolidations abutting the pleura of the posterior RIGHT lower lobe, both measuring 9 mm greatest dimension on today's study, measuring 6-7 mm on earlier study of 07/07/2018. These could represent foci of rounded atelectasis. Neoplastic nodules not excluded. Additional mild atelectasis at the RIGHT lung base. Hepatobiliary: Gallbladder is moderately distended but otherwise unremarkable. No focal liver abnormality is seen. No bile duct dilatation. Pancreas: Unremarkable. No pancreatic ductal dilatation or surrounding inflammatory changes. Spleen: Normal in size without focal abnormality. Adrenals/Urinary Tract: Adrenal glands are unremarkable. Small LEFT renal cyst. Punctate nonobstructing LEFT renal stone. No hydronephrosis bilaterally. No ureteral or bladder calculi identified. Stomach/Bowel: No dilated large or small bowel loops. No evidence of acute bowel wall inflammation. Appendix  is normal. Stomach is unremarkable, partially decompressed. Vascular/Lymphatic: Aortic atherosclerosis. No acute appearing vascular abnormality. No enlarged lymph nodes seen in the abdomen or pelvis. Reproductive: Prostate is unremarkable. Other: No free fluid or abscess collection within the abdomen or pelvis. No free intraperitoneal air. Musculoskeletal: No acute appearing osseous abnormality. Patchy mineralization throughout the visualized osseous structures, presumably related to the given history of leukemia. Degenerative spondylosis within the lower thoracic spine and at the lumbosacral junction, mild to moderate in degree. Questionable thickening/inflammation about the gluteal folds, but no discrete perirectal or perianal abscess collection or fistula identified. IMPRESSION: 1. Questionable thickening/inflammation about the gluteal folds, but no discrete perirectal or perianal abscess collection or fistula identified. 2. Nodular densities (2) abutting the pleura of the posterior right lower lobe, both measuring 9 mm greatest dimension on today's study, measuring 6-7 mm on earlier study of 07/07/2018. These could represent foci of rounded atelectasis, neoplastic nodules not excluded. Would consider one of the following in 3 months for both low-risk and high-risk individuals: (a) repeat chest CT, (b) follow-up PET-CT, or (c) tissue sampling. This recommendation follows the consensus statement: Guidelines for Management of Incidental Pulmonary Nodules Detected on CT Images: From the Fleischner Society 2017; Radiology 2017; 284:228-243. 3. No acute findings within the abdomen or pelvis. No bowel obstruction or evidence of bowel wall inflammation. No evidence of acute solid organ abnormality. No free fluid or abscess collection. No free intraperitoneal air. 4. Punctate nonobstructing left renal stone. Aortic Atherosclerosis (ICD10-I70.0). Electronically Signed   By: Franki Cabot M.D.   On: 12/13/2018 13:42     Procedures Procedures (including critical care time)  CRITICAL CARE Performed by: Fredia Sorrow Total critical care time: 30 minutes Critical care time was exclusive of separately billable procedures and treating other patients. Critical care was necessary to treat or prevent imminent or life-threatening deterioration. Critical care was time spent personally by me  on the following activities: development of treatment plan with patient and/or surrogate as well as nursing, discussions with consultants, evaluation of patient's response to treatment, examination of patient, obtaining history from patient or surrogate, ordering and performing treatments and interventions, ordering and review of laboratory studies, ordering and review of radiographic studies, pulse oximetry and re-evaluation of patient's condition.    Medications Ordered in ED Medications  0.9 %  sodium chloride infusion (1,000 mLs Intravenous New Bag/Given 12/13/18 1532)  sodium chloride (PF) 0.9 % injection (has no administration in time range)  iohexol (OMNIPAQUE) 300 MG/ML solution 100 mL (80 mLs Intravenous Contrast Given 12/13/18 1312)  piperacillin-tazobactam (ZOSYN) IVPB 3.375 g (3.375 g Intravenous New Bag/Given 12/13/18 1532)     Initial Impression / Assessment and Plan / ED Course  I have reviewed the triage vital signs and the nursing notes.  Pertinent labs & imaging results that were available during my care of the patient were reviewed by me and considered in my medical decision making (see chart for details).        Patient's work-up here shows thrombocytopenia with platelets being down.  White count around 15,000.  Documented fever but afebrile here patient nontoxic here.  Initially had septic labs but did not trigger full fluid challenge or broad-spectrum antibiotics.  Patient already on Levaquin.  On on something for a fungal pneumonia.  Patient never hypotensive never tachycardic no respiratory  distress.  Work-up here to include CT scan of the abdomen as well as chest x-ray without any acute findings.  CT did show in the chest may be some nodules.  Patient's vital signs remained stable here.  The decubitus or open ulcer in the buttocks area without any secondary infection CT scan showed no evidence of any pelvic abscess or infection.  Discussed with hematology oncology here since Dr. Malachy Mood was to follow him up as an outpatient but they stated that they are unable to admit him here and they recommended contacting hematology oncology service at [facility].  That was done.  Patient had previous been followed by Dr. Jerrye Noble and Dr. Janyth Contes.  And currently same physician receiving call today.  They will take patient back for treatment.  Recommended getting a blood culture set from the PICC line which is already been done.  The patient needs an additional peripheral blood culture done which has been ordered  In addition they recommended adding Zosyn to his antibiotic regiment.  Did not need to start platelet transfusion at this time.  Essentially patient probably meets criteria for hospice and just comfort care at this point since that the end of the road with treatment for his AML.  But patient wants readmission back to [facility].  They were notified and he will be admitted back there.  Patient was not ready for just hospice and comfort care yet.  Patient clearly is at risk for infection with the the fever the neutropenia predominant blast cells in his differential.  And also the open wound on the buttocks area.  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

## 2018-12-13 NOTE — ED Notes (Signed)
Patient transported to X-ray 

## 2018-12-13 NOTE — Progress Notes (Signed)
CSW received a phone call from [facility] staff member Narda Rutherford who reported that they recently admitted patient from [facility]. Staff reported that patient is able to return when he is medically stable. [facility] weekend on call SW [phone]).  Andrew [name], [place] Worker [facility]  Cell#: [phone]

## 2018-12-13 NOTE — ED Notes (Signed)
Bed: EQ68 Expected date:  Expected time:  Means of arrival:  Comments: 68 yo fever

## 2018-12-13 NOTE — ED Notes (Signed)
Date and time results received: 12/13/18 1:49 PM  (use smartphrase ".now" to insert current time)  Test: Platelets Critical Value: 6  Name of Provider Notified: Vikki Ports, RN  Orders Received? Or Actions Taken?: Actions Taken: Engineer, structural

## 2018-12-15 ENCOUNTER — Inpatient Hospital Stay: Payer: Medicare Other

## 2018-12-15 ENCOUNTER — Inpatient Hospital Stay: Payer: Medicare Other | Attending: Oncology | Admitting: Nurse Practitioner

## 2018-12-15 LAB — PATHOLOGIST SMEAR REVIEW

## 2018-12-18 LAB — CULTURE, BLOOD (ROUTINE X 2)
Culture: NO GROWTH
Culture: NO GROWTH
Special Requests: ADEQUATE
Special Requests: ADEQUATE

## 2018-12-24 DEATH — deceased

## 2019-12-18 IMAGING — CT CT ABDOMEN AND PELVIS WITH CONTRAST
2 of 5 series · 14 of 46 positions shown, 16 images · IV contrast (omnipaque)
Comparison: CT abdomen dated 07/07/2018.

CLINICAL DATA: Fever. History of leukemia and rectal abscess.
Abdominal pain. Abscess suspected.

EXAM:
CT ABDOMEN AND PELVIS WITH CONTRAST
TECHNIQUE: Multidetector CT imaging of the abdomen and pelvis was performed
using the standard protocol following bolus administration of
intravenous contrast.
CONTRAST:  80mL OMNIPAQUE IOHEXOL 300 MG/ML  SOLN

[Series 2: axial st · axial · 0.72mm/px · z∈[-517,-82]mm · 11 of 101 slices shown, 13 images]
[im 7/101  soft-tissue]
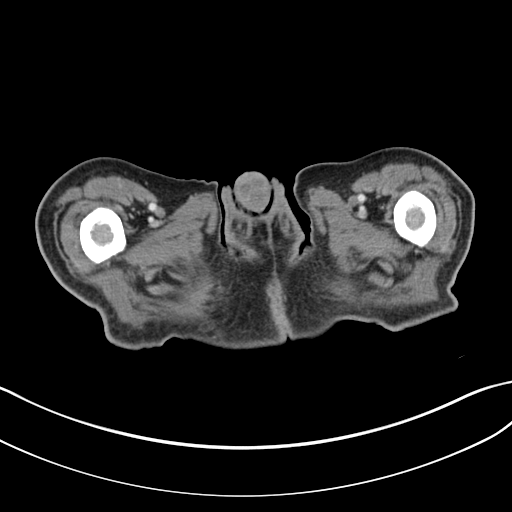
[im 7/101  bone]
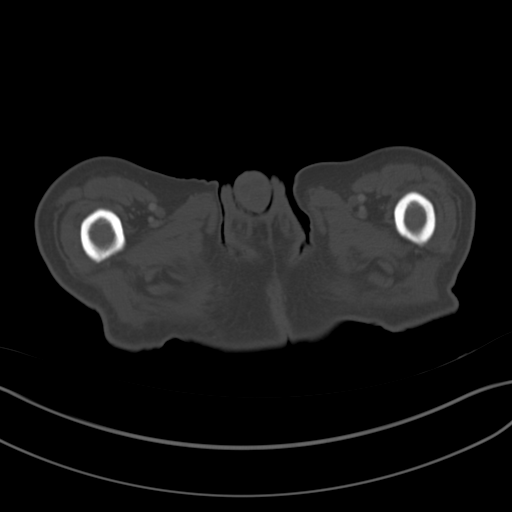
[im 14/101  soft-tissue]
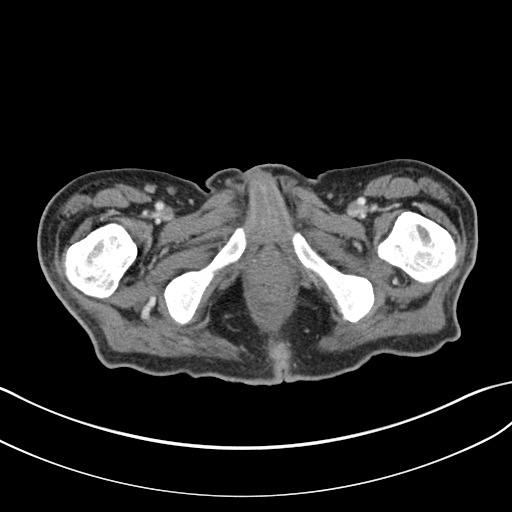
[im 27/101  soft-tissue]
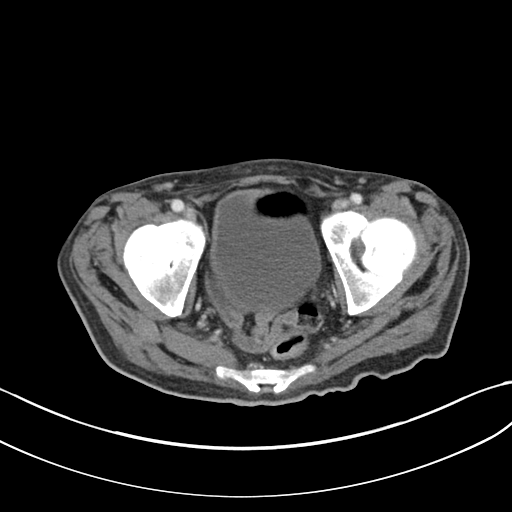
[im 34/101  soft-tissue]
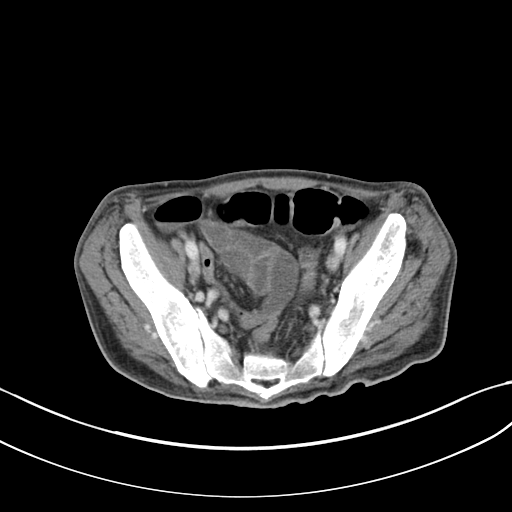
[im 41/101  soft-tissue]
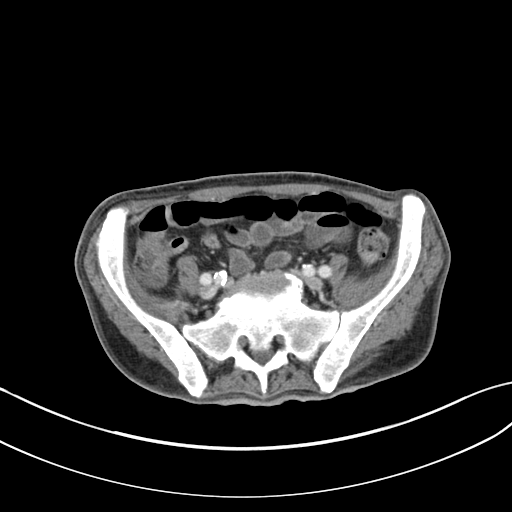
[im 54/101  soft-tissue]
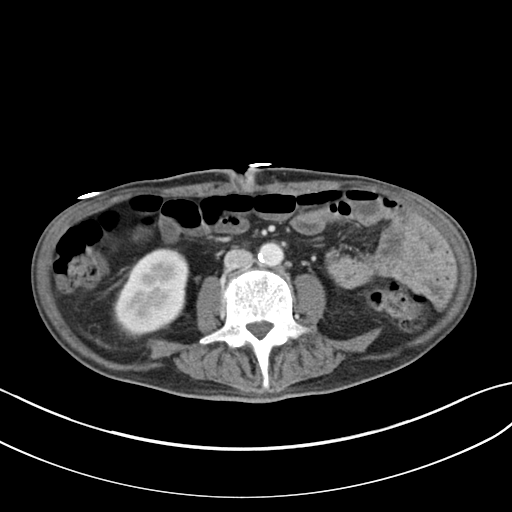
[im 61/101  soft-tissue]
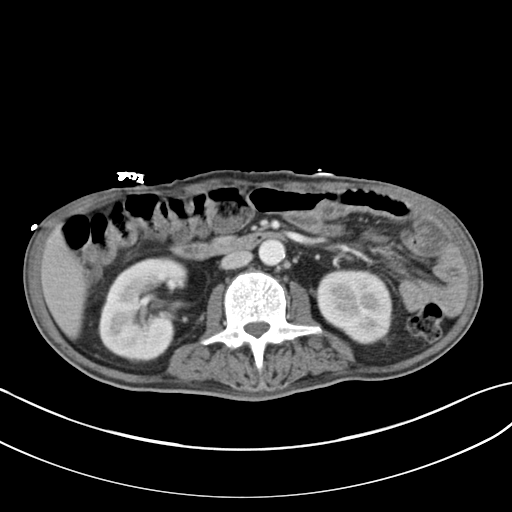
[im 67/101  soft-tissue]
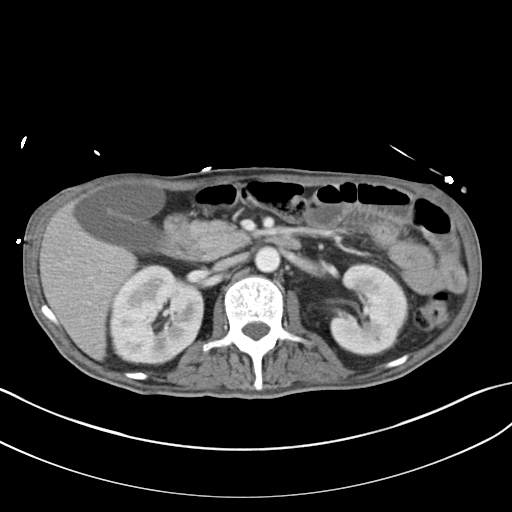
[im 74/101  soft-tissue]
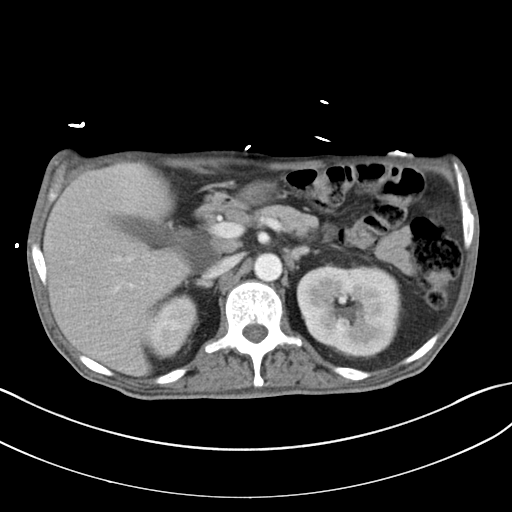
[im 74/101  bone]
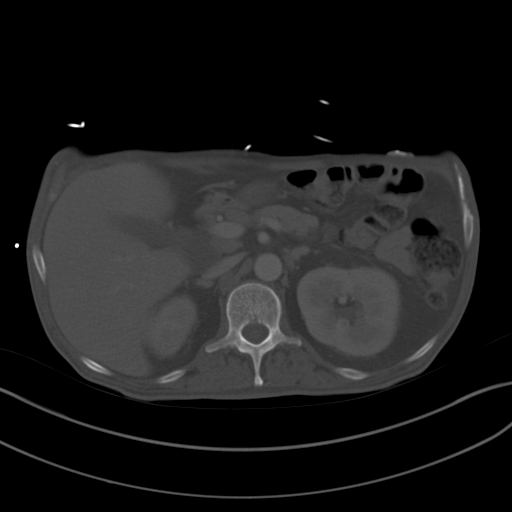
[im 87/101  soft-tissue]
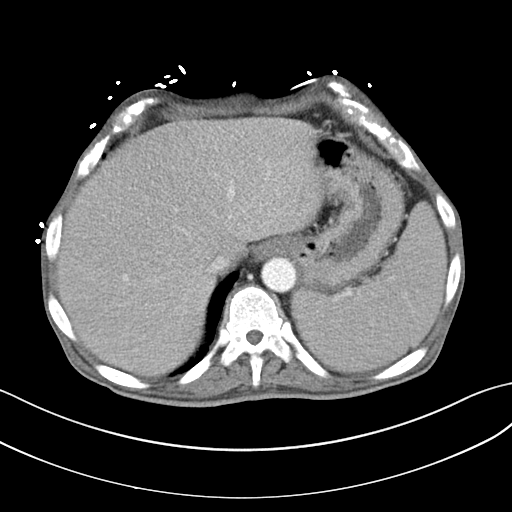
[im 94/101  soft-tissue]
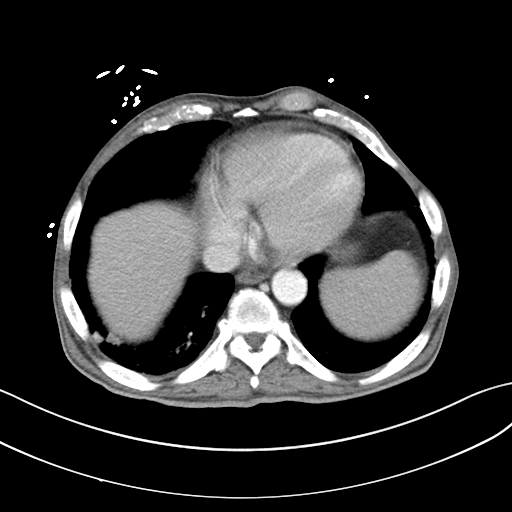

[Series 5: coronal st · coronal · 0.73mm/px · 3 of 140 slices shown]
[im 47/140  soft-tissue]
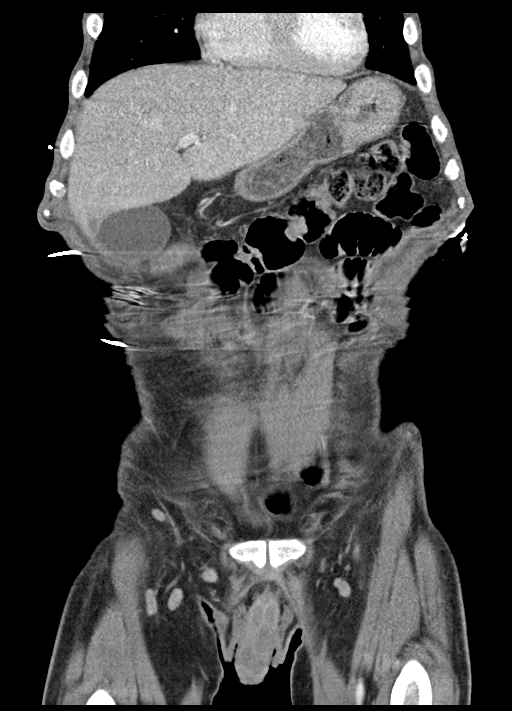
[im 62/140  soft-tissue]
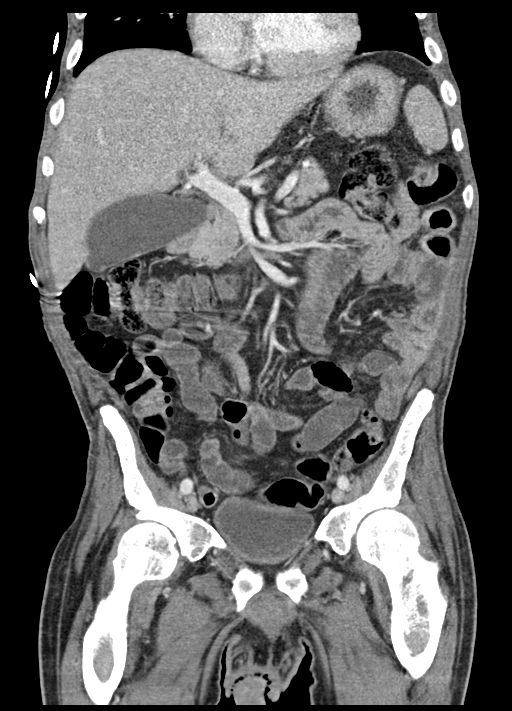
[im 78/140  soft-tissue]
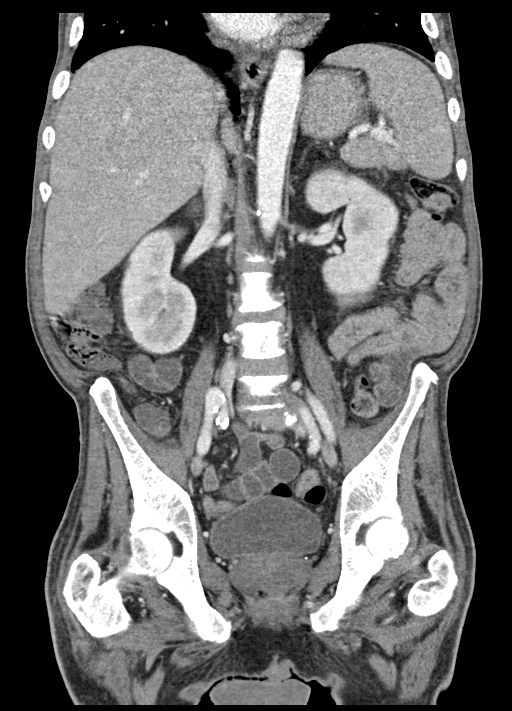

[14 of 46 positions shown; findings below may reference images not displayed]

FINDINGS: Lower chest: Nodular consolidations abutting the pleura of the
posterior RIGHT lower lobe, both measuring 9 mm greatest dimension
on today's study, measuring 6-7 mm on earlier study of 07/07/2018.
These could represent foci of rounded atelectasis. Neoplastic
nodules not excluded. Additional mild atelectasis at the RIGHT lung
base.

Hepatobiliary: Gallbladder is moderately distended but otherwise
unremarkable. No focal liver abnormality is seen. No bile duct
dilatation.

Pancreas: Unremarkable. No pancreatic ductal dilatation or
surrounding inflammatory changes.

Spleen: Normal in size without focal abnormality.

Adrenals/Urinary Tract: Adrenal glands are unremarkable. Small LEFT
renal cyst. Punctate nonobstructing LEFT renal stone. No
hydronephrosis bilaterally. No ureteral or bladder calculi
identified.

Stomach/Bowel: No dilated large or small bowel loops. No evidence of
acute bowel wall inflammation. Appendix is normal. Stomach is
unremarkable, partially decompressed.

Vascular/Lymphatic: Aortic atherosclerosis. No acute appearing
vascular abnormality. No enlarged lymph nodes seen in the abdomen or
pelvis.

Reproductive: Prostate is unremarkable.

Other: No free fluid or abscess collection within the abdomen or
pelvis. No free intraperitoneal air.

Musculoskeletal: No acute appearing osseous abnormality. Patchy
mineralization throughout the visualized osseous structures,
presumably related to the given history of leukemia. Degenerative
spondylosis within the lower thoracic spine and at the lumbosacral
junction, mild to moderate in degree.

Questionable thickening/inflammation about the gluteal folds, but no
discrete perirectal or perianal abscess collection or fistula
identified.
IMPRESSION: 1. Questionable thickening/inflammation about the gluteal folds, but
no discrete perirectal or perianal abscess collection or fistula
identified.
2. Nodular densities (2) abutting the pleura of the posterior right
lower lobe, both measuring 9 mm greatest dimension on today's study,
measuring 6-7 mm on earlier study of 07/07/2018. These could
represent foci of rounded atelectasis, neoplastic nodules not
excluded. Would consider one of the following in 3 months for both
low-risk and high-risk individuals: (a) repeat chest CT, (b)
follow-up PET-CT, or (c) tissue sampling. This recommendation
follows the consensus statement: Guidelines for Management of
Incidental Pulmonary Nodules Detected on CT Images: From the
3. No acute findings within the abdomen or pelvis. No bowel
obstruction or evidence of bowel wall inflammation. No evidence of
acute solid organ abnormality. No free fluid or abscess collection.
No free intraperitoneal air.
4. Punctate nonobstructing left renal stone.

Aortic Atherosclerosis ([id number]-[id number]).
